# Patient Record
Sex: Male | Born: 1935 | Race: White | Hispanic: No | Marital: Married | Smoking: Former smoker
Health system: Southern US, Community
[De-identification: ages and names within clinical notes are randomized; demographics above are authoritative.]

## PROBLEM LIST (undated history)

## (undated) DIAGNOSIS — I639 Cerebral infarction, unspecified: Secondary | ICD-10-CM

## (undated) DIAGNOSIS — J309 Allergic rhinitis, unspecified: Secondary | ICD-10-CM

## (undated) DIAGNOSIS — N189 Chronic kidney disease, unspecified: Secondary | ICD-10-CM

## (undated) DIAGNOSIS — I493 Ventricular premature depolarization: Secondary | ICD-10-CM

## (undated) DIAGNOSIS — Z950 Presence of cardiac pacemaker: Secondary | ICD-10-CM

## (undated) DIAGNOSIS — N289 Disorder of kidney and ureter, unspecified: Secondary | ICD-10-CM

## (undated) DIAGNOSIS — M1990 Unspecified osteoarthritis, unspecified site: Secondary | ICD-10-CM

## (undated) DIAGNOSIS — I6529 Occlusion and stenosis of unspecified carotid artery: Secondary | ICD-10-CM

## (undated) DIAGNOSIS — D649 Anemia, unspecified: Secondary | ICD-10-CM

## (undated) DIAGNOSIS — E785 Hyperlipidemia, unspecified: Secondary | ICD-10-CM

## (undated) DIAGNOSIS — N183 Chronic kidney disease, stage 3 unspecified: Secondary | ICD-10-CM

## (undated) DIAGNOSIS — Z8489 Family history of other specified conditions: Secondary | ICD-10-CM

## (undated) DIAGNOSIS — I5189 Other ill-defined heart diseases: Secondary | ICD-10-CM

## (undated) DIAGNOSIS — E039 Hypothyroidism, unspecified: Secondary | ICD-10-CM

## (undated) DIAGNOSIS — I10 Essential (primary) hypertension: Secondary | ICD-10-CM

## (undated) DIAGNOSIS — M7730 Calcaneal spur, unspecified foot: Secondary | ICD-10-CM

## (undated) DIAGNOSIS — K635 Polyp of colon: Secondary | ICD-10-CM

## (undated) DIAGNOSIS — I779 Disorder of arteries and arterioles, unspecified: Secondary | ICD-10-CM

## (undated) DIAGNOSIS — I442 Atrioventricular block, complete: Secondary | ICD-10-CM

## (undated) DIAGNOSIS — R42 Dizziness and giddiness: Secondary | ICD-10-CM

## (undated) DIAGNOSIS — B356 Tinea cruris: Secondary | ICD-10-CM

## (undated) DIAGNOSIS — I499 Cardiac arrhythmia, unspecified: Secondary | ICD-10-CM

## (undated) HISTORY — DX: Calcaneal spur, unspecified foot: M77.30

## (undated) HISTORY — DX: Unspecified osteoarthritis, unspecified site: M19.90

## (undated) HISTORY — DX: Hyperlipidemia, unspecified: E78.5

## (undated) HISTORY — DX: Ventricular premature depolarization: I49.3

## (undated) HISTORY — DX: Chronic kidney disease, stage 3 unspecified: N18.30

## (undated) HISTORY — PX: GANGLION CYST EXCISION: SHX1691

## (undated) HISTORY — DX: Polyp of colon: K63.5

## (undated) HISTORY — DX: Dizziness and giddiness: R42

## (undated) HISTORY — DX: Disorder of kidney and ureter, unspecified: N28.9

## (undated) HISTORY — PX: CARPAL TUNNEL RELEASE: SHX101

## (undated) HISTORY — PX: BACK SURGERY: SHX140

## (undated) HISTORY — PX: CIRCUMCISION: SUR203

## (undated) HISTORY — DX: Cerebral infarction, unspecified: I63.9

## (undated) HISTORY — DX: Hypothyroidism, unspecified: E03.9

## (undated) HISTORY — DX: Cardiac arrhythmia, unspecified: I49.9

## (undated) HISTORY — DX: Other ill-defined heart diseases: I51.89

## (undated) HISTORY — DX: Essential (primary) hypertension: I10

## (undated) HISTORY — DX: Disorder of arteries and arterioles, unspecified: I77.9

## (undated) HISTORY — DX: Occlusion and stenosis of unspecified carotid artery: I65.29

## (undated) HISTORY — DX: Tinea cruris: B35.6

## (undated) HISTORY — DX: Anemia, unspecified: D64.9

## (undated) HISTORY — DX: Chronic kidney disease, unspecified: N18.9

## (undated) HISTORY — DX: Allergic rhinitis, unspecified: J30.9

## (undated) HISTORY — PX: MOHS SURGERY: SHX181

## (undated) HISTORY — PX: APPENDECTOMY: SHX54

---

## 2005-12-23 ENCOUNTER — Other Ambulatory Visit: Payer: Self-pay

## 2005-12-23 ENCOUNTER — Emergency Department: Payer: Self-pay | Admitting: General Practice

## 2006-01-03 ENCOUNTER — Ambulatory Visit: Payer: Self-pay | Admitting: Internal Medicine

## 2006-06-13 ENCOUNTER — Ambulatory Visit: Payer: Self-pay | Admitting: Internal Medicine

## 2006-07-04 ENCOUNTER — Ambulatory Visit: Payer: Self-pay | Admitting: Internal Medicine

## 2006-07-27 ENCOUNTER — Ambulatory Visit: Payer: Self-pay | Admitting: Gastroenterology

## 2007-04-12 ENCOUNTER — Ambulatory Visit: Payer: Self-pay | Admitting: Ophthalmology

## 2007-05-01 ENCOUNTER — Ambulatory Visit: Payer: Self-pay | Admitting: Urology

## 2007-05-01 ENCOUNTER — Other Ambulatory Visit: Payer: Self-pay

## 2007-05-04 ENCOUNTER — Ambulatory Visit: Payer: Self-pay | Admitting: Urology

## 2007-05-10 ENCOUNTER — Ambulatory Visit: Payer: Self-pay | Admitting: Internal Medicine

## 2010-07-03 ENCOUNTER — Ambulatory Visit: Payer: Self-pay | Admitting: Anesthesiology

## 2010-07-07 ENCOUNTER — Ambulatory Visit: Payer: Self-pay | Admitting: Unknown Physician Specialty

## 2010-07-08 LAB — PATHOLOGY REPORT

## 2012-03-02 ENCOUNTER — Ambulatory Visit: Payer: Self-pay | Admitting: Internal Medicine

## 2013-07-16 ENCOUNTER — Ambulatory Visit: Payer: Self-pay | Admitting: Gastroenterology

## 2013-11-23 ENCOUNTER — Encounter (INDEPENDENT_AMBULATORY_CARE_PROVIDER_SITE_OTHER): Payer: Self-pay

## 2013-11-23 ENCOUNTER — Encounter: Payer: Self-pay | Admitting: Cardiovascular Disease

## 2013-11-23 ENCOUNTER — Ambulatory Visit (INDEPENDENT_AMBULATORY_CARE_PROVIDER_SITE_OTHER): Payer: Medicare Other | Admitting: Cardiovascular Disease

## 2013-11-23 VITALS — BP 160/90 | HR 75 | Ht 70.0 in | Wt 216.0 lb

## 2013-11-23 DIAGNOSIS — I10 Essential (primary) hypertension: Secondary | ICD-10-CM

## 2013-11-23 DIAGNOSIS — I499 Cardiac arrhythmia, unspecified: Secondary | ICD-10-CM

## 2013-11-23 DIAGNOSIS — I4949 Other premature depolarization: Secondary | ICD-10-CM

## 2013-11-23 DIAGNOSIS — R0602 Shortness of breath: Secondary | ICD-10-CM

## 2013-11-23 DIAGNOSIS — I493 Ventricular premature depolarization: Secondary | ICD-10-CM

## 2013-11-23 NOTE — Assessment & Plan Note (Signed)
Blood pressure is elevated. I will consider treatment with a beta blocker or calcium channel blocker given the presence of PVCs.

## 2013-11-23 NOTE — Progress Notes (Signed)
Primary care physician: Dr. Judithann Sheen  HPI  This is a pleasant 80 year old man who is here today for evaluation of PVCs. He is the husband of Swan Handzel who is one of my patient. He has known history of hypertension but no diabetes or hyperlipidemia. He is a previous smoker. There is no family history of coronary artery disease or arrhythmia.  He has noticed mild dizziness recently especially if he stands up quickly. There has been no syncope or presyncope. He denies any chest pain but does complain of mild exertional dyspnea. He reports improvement in dizziness with meclizine. He was noted to have premature beats recently. Carotid ultrasound showed mild disease. He consumes 2 cups of coffee a day.    Allergies  Allergen Reactions  . Morphine And Related      No current outpatient prescriptions on file prior to visit.   No current facility-administered medications on file prior to visit.     Past Medical History  Diagnosis Date  . Hypothyroidism   . Renal insufficiency   . Anemia, unspecified   . Dizziness and giddiness   . Cardiac dysrhythmia, unspecified   . Carotid artery occlusion     mild  . OA (osteoarthritis)   . Hyperlipidemia   . Allergic rhinitis   . Heel spur   . Colonic polyp   . Tinea cruris   . Chronic kidney disease   . Hypertension      Past Surgical History  Procedure Laterality Date  . Back surgery    . Appendectomy    . Ganglion cyst excision    . Circumcision       Family History  Problem Relation Age of Onset  . Family history unknown: Yes     History   Social History  . Marital Status: Married    Spouse Name: N/A    Number of Children: N/A  . Years of Education: N/A   Occupational History  . Not on file.   Social History Main Topics  . Smoking status: Former Smoker -- 4 years    Types: Cigarettes  . Smokeless tobacco: Not on file  . Alcohol Use: Yes     Comment: occasional  . Drug Use: No  . Sexual Activity: Not on file     Other Topics Concern  . Not on file   Social History Narrative  . No narrative on file     ROS A 10 point review of system was performed. It is negative other than that mentioned in the history of present illness.   PHYSICAL EXAM   BP 160/90  Pulse 75  Ht 5\' 10"  (1.778 m)  Wt 216 lb (97.977 kg)  BMI 30.99 kg/m2 Constitutional: He is oriented to person, place, and time. He appears well-developed and well-nourished. No distress.  HENT: No nasal discharge.  Head: Normocephalic and atraumatic.  Eyes: Pupils are equal and round.  No discharge. Neck: Normal range of motion. Neck supple. No JVD present. No thyromegaly present.  Cardiovascular: Normal rate, regular rhythm with premature beats, normal heart sounds. Exam reveals no gallop and no friction rub. No murmur heard.  Pulmonary/Chest: Effort normal and breath sounds normal. No stridor. No respiratory distress. He has no wheezes. He has no rales. He exhibits no tenderness.  Abdominal: Soft. Bowel sounds are normal. He exhibits no distension. There is no tenderness. There is no rebound and no guarding.  Musculoskeletal: Normal range of motion. He exhibits no edema and no tenderness.  Neurological: He is  alert and oriented to person, place, and time. Coordination normal.  Skin: Skin is warm and dry. No rash noted. He is not diaphoretic. No erythema. No pallor.  Psychiatric: He has a normal mood and affect. His behavior is normal. Judgment and thought content normal.       ZOX:WRUEAEKG:Sinus  Rhythm  - occasional ectopic ventricular beat    -Right bundle branch block.   ABNORMAL     ASSESSMENT AND PLAN

## 2013-11-23 NOTE — Patient Instructions (Signed)
Your physician has recommended that you wear a holter monitor. Holter monitors are medical devices that record the heart's electrical activity. Doctors most often use these monitors to diagnose arrhythmias. Arrhythmias are problems with the speed or rhythm of the heartbeat. The monitor is a small, portable device. You can wear one while you do your normal daily activities. This is usually used to diagnose what is causing palpitations/syncope (passing out).   Your physician has requested that you have an echocardiogram. Echocardiography is a painless test that uses sound waves to create images of your heart. It provides your doctor with information about the size and shape of your heart and how well your heart's chambers and valves are working. This procedure takes approximately one hour. There are no restrictions for this procedure.   Your physician recommends that you schedule a follow-up appointment in:  After your holter and echo

## 2013-11-23 NOTE — Assessment & Plan Note (Signed)
The patient is noted to have PVCs on his EKG. Symptoms include dizziness without syncope. I requested a 48-hour Holter monitor to quantify the burden of PVCs. I recommend checking routine labs if not already done including thyroid function. I also requested an echocardiogram for evaluation.

## 2013-12-04 DIAGNOSIS — I4949 Other premature depolarization: Secondary | ICD-10-CM

## 2013-12-06 ENCOUNTER — Other Ambulatory Visit (INDEPENDENT_AMBULATORY_CARE_PROVIDER_SITE_OTHER): Payer: Medicare Other

## 2013-12-06 ENCOUNTER — Other Ambulatory Visit: Payer: Self-pay

## 2013-12-06 DIAGNOSIS — R0602 Shortness of breath: Secondary | ICD-10-CM

## 2013-12-11 ENCOUNTER — Other Ambulatory Visit: Payer: Self-pay

## 2013-12-11 ENCOUNTER — Ambulatory Visit (INDEPENDENT_AMBULATORY_CARE_PROVIDER_SITE_OTHER): Payer: Medicare Other | Admitting: *Deleted

## 2013-12-11 ENCOUNTER — Telehealth: Payer: Self-pay | Admitting: *Deleted

## 2013-12-11 DIAGNOSIS — I493 Ventricular premature depolarization: Secondary | ICD-10-CM

## 2013-12-11 DIAGNOSIS — I4949 Other premature depolarization: Secondary | ICD-10-CM

## 2013-12-11 MED ORDER — METOPROLOL TARTRATE 25 MG PO TABS: 25.0000 mg | ORAL_TABLET | Freq: Two times a day (BID) | ORAL | Status: DC

## 2013-12-11 NOTE — Telephone Encounter (Signed)
Informed patient that per Dr. Kirke CorinArida his holter showed:  Normal sinus rhythm with frequent PVC's and PAC's  Start Metoprolol 25 mg BID  Provided education on PVC/PAC and metoprolol  Patient verbalized understanding

## 2013-12-14 ENCOUNTER — Encounter: Payer: Self-pay | Admitting: Cardiovascular Disease

## 2013-12-14 ENCOUNTER — Ambulatory Visit (INDEPENDENT_AMBULATORY_CARE_PROVIDER_SITE_OTHER): Payer: Medicare Other | Admitting: Cardiovascular Disease

## 2013-12-14 VITALS — BP 138/68 | HR 66 | Ht 70.0 in | Wt 212.0 lb

## 2013-12-14 DIAGNOSIS — I493 Ventricular premature depolarization: Secondary | ICD-10-CM

## 2013-12-14 DIAGNOSIS — I10 Essential (primary) hypertension: Secondary | ICD-10-CM

## 2013-12-14 DIAGNOSIS — I4949 Other premature depolarization: Secondary | ICD-10-CM

## 2013-12-14 MED ORDER — DILTIAZEM HCL ER COATED BEADS 120 MG PO CP24: 120.0000 mg | ORAL_CAPSULE | Freq: Every day | ORAL | Status: DC

## 2013-12-14 NOTE — Patient Instructions (Signed)
Your physician has recommended you make the following change in your medication: Stop Metoprolol  Start Diltiazem 120 mg daily   Your physician recommends that you schedule a follow-up appointment in:  2 months with Dr. Kirke CorinArida

## 2013-12-14 NOTE — Progress Notes (Signed)
Primary care physician: Dr. Judithann SheenSparks  HPI  This is a pleasant 80 year old man who is here today for a followup visit regarding PVCs. He is the husband of Travis [name] who is one of my patient. He has known history of hypertension but no diabetes or hyperlipidemia. He is a previous smoker.  He was seen recently for  mild dizziness especially if he stands up quickly. There has been no syncope or presyncope. He denies any chest pain but does complain of mild exertional dyspnea.  Carotid ultrasound showed mild disease.  He underwent an echocardiogram which showed normal LV systolic function, mild grade 1 diastolic dysfunction and mild aortic regurgitation. Holter monitor showed PACs (close to 4000) and PVCs (1400). He was started on metoprolol 25 mg twice daily. However, he reports no improvement in symptoms. He actually feels more dizzy since he started taking metoprolol.   Allergies  Allergen Reactions  . Morphine And Related   . Tetracyclines & Related     Other reaction(s): UNKNOWN     Current Outpatient Prescriptions on File Prior to Visit  Medication Sig Dispense Refill  . aspirin 81 MG tablet Take 81 mg by mouth daily.      . benazepril-hydrochlorthiazide (LOTENSIN HCT) 20-12.5 MG per tablet Take 1 tablet by mouth daily.      . celecoxib (CELEBREX) 200 MG capsule Take 200 mg by mouth daily.      Marland Kitchen. doxazosin (CARDURA) 4 MG tablet Take 4 mg by mouth daily.      . finasteride (PROSCAR) 5 MG tablet Take 5 mg by mouth daily.      . fluticasone (FLONASE) 50 MCG/ACT nasal spray Place 1 spray into both nostrils as needed for allergies or rhinitis.      Marland Kitchen. levothyroxine (SYNTHROID, LEVOTHROID) 50 MCG tablet Take 50 mcg by mouth daily before breakfast.      . metoprolol tartrate (LOPRESSOR) 25 MG tablet Take 1 tablet (25 mg total) by mouth 2 (two) times daily.  180 tablet  3  . potassium chloride SA (K-DUR,KLOR-CON) 20 MEQ tablet Take 20 mEq by mouth 2 (two) times daily.      . ranitidine (ZANTAC)  75 MG tablet Take 75 mg by mouth as needed for heartburn.       No current facility-administered medications on file prior to visit.     Past Medical History  Diagnosis Date  . Hypothyroidism   . Renal insufficiency   . Anemia, unspecified   . Dizziness and giddiness   . Cardiac dysrhythmia, unspecified   . Carotid artery occlusion     mild  . OA (osteoarthritis)   . Hyperlipidemia   . Allergic rhinitis   . Heel spur   . Colonic polyp   . Tinea cruris   . Chronic kidney disease   . Hypertension      Past Surgical History  Procedure Laterality Date  . Back surgery    . Appendectomy    . Ganglion cyst excision    . Circumcision       History reviewed. No pertinent family history.   History   Social History  . Marital Status: Married    Spouse Name: N/A    Number of Children: N/A  . Years of Education: N/A   Occupational History  . Not on file.   Social History Main Topics  . Smoking status: Former Smoker -- 4 years    Types: Cigarettes  . Smokeless tobacco: Not on file  . Alcohol Use: Yes  Comment: occasional  . Drug Use: No  . Sexual Activity: Not on file   Other Topics Concern  . Not on file   Social History Narrative  . No narrative on file     ROS A 10 point review of system was performed. It is negative other than that mentioned in the history of present illness.   PHYSICAL EXAM   BP 138/68  Pulse 66  Ht 5\' 10"  (1.778 m)  Wt 212 lb (96.163 kg)  BMI 30.42 kg/m2 Constitutional: He is oriented to person, place, and time. He appears well-developed and well-nourished. No distress.  HENT: No nasal discharge.  Head: Normocephalic and atraumatic.  Eyes: Pupils are equal and round.  No discharge. Neck: Normal range of motion. Neck supple. No JVD present. No thyromegaly present.  Cardiovascular: Normal rate, regular rhythm with premature beats, normal heart sounds. Exam reveals no gallop and no friction rub. No murmur heard.    Pulmonary/Chest: Effort normal and breath sounds normal. No stridor. No respiratory distress. He has no wheezes. He has no rales. He exhibits no tenderness.  Abdominal: Soft. Bowel sounds are normal. He exhibits no distension. There is no tenderness. There is no rebound and no guarding.  Musculoskeletal: Normal range of motion. He exhibits no edema and no tenderness.  Neurological: He is alert and oriented to person, place, and time. Coordination normal.  Skin: Skin is warm and dry. No rash noted. He is not diaphoretic. No erythema. No pallor.  Psychiatric: He has a normal mood and affect. His behavior is normal. Judgment and thought content normal.       ZOX:WRUEAEKG:Sinus  Rhythm  - occasional ectopic ventricular beat    -Right bundle branch block.    ABNORMAL     ASSESSMENT AND PLAN

## 2013-12-15 NOTE — Assessment & Plan Note (Signed)
He had no significant improvement with metoprolol. Thus, I switched him to diltiazem extended release 120 mg once daily. Most of the patient's symptoms seem to be related to orthostatic dizziness which might be a separate issue from the PVCs.  Some of his antihypertensive medications might be contributing to his orthostatic dizziness including Cardura and hydrochlorothiazide. I asked him to discuss with Dr. Judithann SheenSparks about the possibility of stopping Cardura or decreasing the dose.

## 2013-12-15 NOTE — Assessment & Plan Note (Signed)
Blood pressure is reasonably controlled on current medications. 

## 2014-02-15 ENCOUNTER — Encounter: Payer: Self-pay | Admitting: Cardiovascular Disease

## 2014-02-15 ENCOUNTER — Ambulatory Visit (INDEPENDENT_AMBULATORY_CARE_PROVIDER_SITE_OTHER): Payer: Medicare Other | Admitting: Cardiovascular Disease

## 2014-02-15 VITALS — BP 130/74 | HR 58 | Ht 70.0 in | Wt 215.5 lb

## 2014-02-15 DIAGNOSIS — I493 Ventricular premature depolarization: Secondary | ICD-10-CM

## 2014-02-15 DIAGNOSIS — I4949 Other premature depolarization: Secondary | ICD-10-CM

## 2014-02-15 DIAGNOSIS — I10 Essential (primary) hypertension: Secondary | ICD-10-CM

## 2014-02-15 NOTE — Assessment & Plan Note (Signed)
Symptoms improved significantly after switching from metoprolol to diltiazem. He continues to have moderate orthostatic dizziness which  could be related to treatment with Cardura and hydrochlorothiazide. Symptoms have improved significantly and thus I made no changes today.

## 2014-02-15 NOTE — Patient Instructions (Signed)
Continue same medications.   Your physician wants you to follow-up in: 12 month.  You will receive a reminder letter in the mail two months in advance. If you don't receive a letter, please call our office to schedule the follow-up appointment.  

## 2014-02-15 NOTE — Progress Notes (Signed)
Primary care physician: Dr. Judithann SheenSparks  HPI  This is a pleasant 80 year old man who is here today for a followup visit regarding PVCs. He is the husband of Elvera LennoxLaura Norfolk who is one of my patient. He has known history of hypertension but no diabetes or hyperlipidemia. He is a previous smoker.  He was seen recently for  mild dizziness especially if he stands up quickly. There has been no syncope or presyncope. He denies any chest pain but does complain of mild exertional dyspnea.  Carotid ultrasound showed mild disease.  He underwent an echocardiogram which showed normal LV systolic function, mild grade 1 diastolic dysfunction and mild aortic regurgitation. Holter monitor showed PACs (close to 4000) and PVCs (1400). He was started on metoprolol 25 mg twice daily. However, he had worsening symptoms with metoprolol and thus I switched him to diltiazem. He has been doing better since then.  Allergies  Allergen Reactions  . Morphine And Related   . Phenol     Other reaction(s): Unknown  . Tetracyclines & Related     Other reaction(s): UNKNOWN     Current Outpatient Prescriptions on File Prior to Visit  Medication Sig Dispense Refill  . aspirin 81 MG tablet Take 81 mg by mouth daily.      . benazepril-hydrochlorthiazide (LOTENSIN HCT) 20-12.5 MG per tablet Take 1 tablet by mouth daily.      . celecoxib (CELEBREX) 200 MG capsule Take 200 mg by mouth daily.      Marland Kitchen. diltiazem (CARDIZEM CD) 120 MG 24 hr capsule Take 1 capsule (120 mg total) by mouth daily.  90 capsule  3  . doxazosin (CARDURA) 4 MG tablet Take 4 mg by mouth daily.      . finasteride (PROSCAR) 5 MG tablet Take 5 mg by mouth daily.      . fluticasone (FLONASE) 50 MCG/ACT nasal spray Place 1 spray into both nostrils as needed for allergies or rhinitis.      Marland Kitchen. levothyroxine (SYNTHROID, LEVOTHROID) 50 MCG tablet Take 50 mcg by mouth daily before breakfast.      . potassium chloride SA (K-DUR,KLOR-CON) 20 MEQ tablet Take 20 mEq by mouth 2  (two) times daily.      . ranitidine (ZANTAC) 75 MG tablet Take 75 mg by mouth as needed for heartburn.       No current facility-administered medications on file prior to visit.     Past Medical History  Diagnosis Date  . Hypothyroidism   . Renal insufficiency   . Anemia, unspecified   . Dizziness and giddiness   . Cardiac dysrhythmia, unspecified   . Carotid artery occlusion     mild  . OA (osteoarthritis)   . Hyperlipidemia   . Allergic rhinitis   . Heel spur   . Colonic polyp   . Tinea cruris   . Chronic kidney disease   . Hypertension      Past Surgical History  Procedure Laterality Date  . Back surgery    . Appendectomy    . Ganglion cyst excision    . Circumcision       Family History  Problem Relation Age of Onset  . Family history unknown: Yes     History   Social History  . Marital Status: Married    Spouse Name: N/A    Number of Children: N/A  . Years of Education: N/A   Occupational History  . Not on file.   Social History Main Topics  . Smoking status:  Former Smoker -- 4 years    Types: Cigarettes  . Smokeless tobacco: Not on file  . Alcohol Use: Yes     Comment: occasional  . Drug Use: No  . Sexual Activity: Not on file   Other Topics Concern  . Not on file   Social History Narrative  . No narrative on file     ROS A 10 point review of system was performed. It is negative other than that mentioned in the history of present illness.   PHYSICAL EXAM   BP 130/74  Pulse 58  Ht 5\' 10"  (1.778 m)  Wt 215 lb 8 oz (97.75 kg)  BMI 30.92 kg/m2 Constitutional: He is oriented to person, place, and time. He appears well-developed and well-nourished. No distress.  HENT: No nasal discharge.  Head: Normocephalic and atraumatic.  Eyes: Pupils are equal and round.  No discharge. Neck: Normal range of motion. Neck supple. No JVD present. No thyromegaly present.  Cardiovascular: Normal rate, regular rhythm with premature beats, normal  heart sounds. Exam reveals no gallop and no friction rub. No murmur heard.  Pulmonary/Chest: Effort normal and breath sounds normal. No stridor. No respiratory distress. He has no wheezes. He has no rales. He exhibits no tenderness.  Abdominal: Soft. Bowel sounds are normal. He exhibits no distension. There is no tenderness. There is no rebound and no guarding.  Musculoskeletal: Normal range of motion. He exhibits no edema and no tenderness.  Neurological: He is alert and oriented to person, place, and time. Coordination normal.  Skin: Skin is warm and dry. No rash noted. He is not diaphoretic. No erythema. No pallor.  Psychiatric: He has a normal mood and affect. His behavior is normal. Judgment and thought content normal.       ZOX:WRUEA  Bradycardia  -Right bundle branch block.   ABNORMAL     ASSESSMENT AND PLAN

## 2014-02-15 NOTE — Assessment & Plan Note (Signed)
Blood pressure is well controlled on current medications. 

## 2014-11-04 ENCOUNTER — Other Ambulatory Visit: Payer: Self-pay

## 2014-11-04 MED ORDER — DILTIAZEM HCL ER COATED BEADS 120 MG PO CP24: 120.0000 mg | ORAL_CAPSULE | Freq: Every day | ORAL | Status: DC

## 2014-11-04 NOTE — Telephone Encounter (Signed)
Refill sent for diltiazem

## 2015-02-17 ENCOUNTER — Encounter: Payer: Self-pay | Admitting: *Deleted

## 2015-02-17 ENCOUNTER — Ambulatory Visit: Payer: No Typology Code available for payment source | Admitting: Cardiovascular Disease

## 2015-02-24 ENCOUNTER — Encounter: Payer: Self-pay | Admitting: Cardiovascular Disease

## 2015-02-24 ENCOUNTER — Ambulatory Visit (INDEPENDENT_AMBULATORY_CARE_PROVIDER_SITE_OTHER): Payer: Medicare Other | Admitting: Cardiovascular Disease

## 2015-02-24 VITALS — BP 160/80 | HR 65 | Ht 70.0 in | Wt 213.8 lb

## 2015-02-24 DIAGNOSIS — I493 Ventricular premature depolarization: Secondary | ICD-10-CM | POA: Diagnosis not present

## 2015-02-24 DIAGNOSIS — I10 Essential (primary) hypertension: Secondary | ICD-10-CM | POA: Diagnosis not present

## 2015-02-24 NOTE — Assessment & Plan Note (Signed)
These seem to be controlled with current dose of diltiazem.. I made no changes today.

## 2015-02-24 NOTE — Assessment & Plan Note (Signed)
Blood pressure is elevated today but that has not been the trend recently. Continue to monitor.

## 2015-02-24 NOTE — Progress Notes (Signed)
Primary care physician: Dr. Judithann Sheen  HPI  This is a pleasant 81 year old man who is here today for a followup visit regarding PVCs. He is the husband of Kaylum Shrum who is one of my patient. He has known history of hypertension and chronic kidney disease but no diabetes or hyperlipidemia. He is a previous smoker.   Echocardiogram in June 2015 showed normal LV systolic function, mild grade 1 diastolic dysfunction and mild aortic regurgitation. Holter monitor showed PACs (close to 4000) and PVCs (1400). He was treated with metoprolol 25 mg twice daily. However, he had worsening symptoms with metoprolol and thus I switched him to diltiazem. He has been doing better since then. No new reported symptoms. He has last done recently with Dr. Judithann Sheen which showed stable kidney function with a creatinine of 1.7. Blood pressure is usually not as high as it is today.  Allergies  Allergen Reactions  . Morphine And Related   . Phenol     Other reaction(s): Unknown  . Tetracyclines & Related     Other reaction(s): UNKNOWN     Current Outpatient Prescriptions on File Prior to Visit  Medication Sig Dispense Refill  . aspirin 81 MG tablet Take 81 mg by mouth daily.    . benazepril-hydrochlorthiazide (LOTENSIN HCT) 20-12.5 MG per tablet Take 1 tablet by mouth daily.    . celecoxib (CELEBREX) 200 MG capsule Take 200 mg by mouth daily.    Marland Kitchen diltiazem (CARDIZEM CD) 120 MG 24 hr capsule Take 1 capsule (120 mg total) by mouth daily. 90 capsule 3  . doxazosin (CARDURA) 4 MG tablet Take 4 mg by mouth daily.    . finasteride (PROSCAR) 5 MG tablet Take 5 mg by mouth daily.    . fluticasone (FLONASE) 50 MCG/ACT nasal spray Place 1 spray into both nostrils as needed for allergies or rhinitis.    Marland Kitchen levothyroxine (SYNTHROID, LEVOTHROID) 50 MCG tablet Take 50 mcg by mouth daily before breakfast.    . potassium chloride SA (K-DUR,KLOR-CON) 20 MEQ tablet Take 20 mEq by mouth 2 (two) times daily.    . ranitidine (ZANTAC)  75 MG tablet Take 75 mg by mouth as needed for heartburn.     No current facility-administered medications on file prior to visit.     Past Medical History  Diagnosis Date  . Hypothyroidism   . Renal insufficiency   . Anemia, unspecified   . Dizziness and giddiness   . Cardiac dysrhythmia, unspecified   . Carotid artery occlusion     mild  . OA (osteoarthritis)   . Hyperlipidemia   . Allergic rhinitis   . Heel spur   . Colonic polyp   . Tinea cruris   . Chronic kidney disease   . Hypertension      Past Surgical History  Procedure Laterality Date  . Back surgery    . Appendectomy    . Ganglion cyst excision    . Circumcision       Family History  Problem Relation Age of Onset  . Family history unknown: Yes     Social History   Social History  . Marital Status: Married    Spouse Name: N/A  . Number of Children: N/A  . Years of Education: N/A   Occupational History  . Not on file.   Social History Main Topics  . Smoking status: Former Smoker -- 4 years    Types: Cigarettes  . Smokeless tobacco: Not on file  . Alcohol Use: Yes  Comment: occasional  . Drug Use: No  . Sexual Activity: Not on file   Other Topics Concern  . Not on file   Social History Narrative     ROS A 10 point review of system was performed. It is negative other than that mentioned in the history of present illness.   PHYSICAL EXAM   BP 160/80 mmHg  Pulse 65  Ht 5\' 10"  (1.778 m)  Wt 213 lb 12 oz (96.956 kg)  BMI 30.67 kg/m2 Constitutional: He is oriented to person, place, and time. He appears well-developed and well-nourished. No distress.  HENT: No nasal discharge.  Head: Normocephalic and atraumatic.  Eyes: Pupils are equal and round.  No discharge. Neck: Normal range of motion. Neck supple. No JVD present. No thyromegaly present.  Cardiovascular: Normal rate, regular rhythm with premature beats, normal heart sounds. Exam reveals no gallop and no friction rub. No  murmur heard.  Pulmonary/Chest: Effort normal and breath sounds normal. No stridor. No respiratory distress. He has no wheezes. He has no rales. He exhibits no tenderness.  Abdominal: Soft. Bowel sounds are normal. He exhibits no distension. There is no tenderness. There is no rebound and no guarding.  Musculoskeletal: Normal range of motion. He exhibits no edema and no tenderness.  Neurological: He is alert and oriented to person, place, and time. Coordination normal.  Skin: Skin is warm and dry. No rash noted. He is not diaphoretic. No erythema. No pallor.  Psychiatric: He has a normal mood and affect. His behavior is normal. Judgment and thought content normal.       ZOX:WRUEA  Rhythm  -Right bundle branch block.   ABNORMAL     ASSESSMENT AND PLAN

## 2015-02-24 NOTE — Patient Instructions (Signed)
Medication Instructions: Continue same medications.   Labwork: None.   Procedures/Testing: None.   Follow-Up: 1 year with Dr. Sabrena Gavitt  Any Additional Special Instructions Will Be Listed Below (If Applicable).   

## 2015-07-16 ENCOUNTER — Telehealth: Payer: Self-pay | Admitting: *Deleted

## 2015-07-16 ENCOUNTER — Ambulatory Visit: Payer: No Typology Code available for payment source | Admitting: Cardiovascular Disease

## 2015-07-16 NOTE — Telephone Encounter (Signed)
Dr sparks called earlier to see if we can fit pt in to see [place] today but pt called back stating he could not come in But per Dr Judithann Sheen he was having Atypical CP  Pt is awaiting our call or suggestion as to what he needs to do.   Calling Dr Judithann Sheen office to get EKG sent over.Travis [name]

## 2015-07-16 NOTE — Telephone Encounter (Signed)
S/w pt who states he is willing to make appt w/Dr. Kirke Corin. He was unable to come in today. Reviewed sx that would need immediate attention. Pt verbalized understanding.  Forwarded to scheduling.  Appt Jan 24 w/Ryan Dunn, PA-C

## 2015-07-22 ENCOUNTER — Encounter: Payer: Self-pay | Admitting: Physician Assistant

## 2015-07-22 ENCOUNTER — Ambulatory Visit (INDEPENDENT_AMBULATORY_CARE_PROVIDER_SITE_OTHER): Payer: Medicare Other | Admitting: Physician Assistant

## 2015-07-22 VITALS — BP 134/78 | HR 72 | Ht 70.0 in | Wt 215.5 lb

## 2015-07-22 DIAGNOSIS — I491 Atrial premature depolarization: Secondary | ICD-10-CM | POA: Diagnosis not present

## 2015-07-22 DIAGNOSIS — R208 Other disturbances of skin sensation: Secondary | ICD-10-CM | POA: Diagnosis not present

## 2015-07-22 DIAGNOSIS — R079 Chest pain, unspecified: Secondary | ICD-10-CM

## 2015-07-22 DIAGNOSIS — I493 Ventricular premature depolarization: Secondary | ICD-10-CM | POA: Diagnosis not present

## 2015-07-22 DIAGNOSIS — R200 Anesthesia of skin: Secondary | ICD-10-CM

## 2015-07-22 DIAGNOSIS — I10 Essential (primary) hypertension: Secondary | ICD-10-CM

## 2015-07-22 NOTE — Progress Notes (Signed)
Cardiology Office Note Date:  07/22/2015  Patient ID:  Travis Ressel., DOB [birth year], MRN [id number] PCP:  Marguarite Arbour, MD  Cardiologist:  Dr. Kirke Corin, MD    Chief Complaint: Chest pain  History of Present Illness: Travis Anctil. is a 82 y.o. male with history of PVCs, PACs, CKD stage III, prior tobacco abuse, and HTN who presents at the request of his PCP for the evaluation of chest pain. Echocardiogram in June 2015 showed normal LV systolic function, mild grade 1 diastolic dysfunction and mild aortic regurgitation. Holter monitor showed PACs (close to 4000) and PVCs (1400). He was treated with metoprolol 25 mg twice daily. However, he had worsening symptoms with metoprolol and thus he was switched him to diltiazem. He has been doing better since then. He was seen by his PCP on 1/18 for routine follow up and was doing well at that time. He happened to mention at that time he had an episode of chest pain on 1/17 that lasted most of the day. He thinks the pain began when he woke up, but he is not sure. He is also not sure if the pain was reproducible to palpation. There were no associated symptoms. He has not noticed any changes in his functional capacity. No orthopnea, lower extremity swelling, PND, cough, diaphoresis, or early satiety.   He also mentions a long history of bilateral hand numbness, only when he sleeps. Symptoms did not begin until he bought a new pillow. PCP has tried a muscle relaxer.    Past Medical History  Diagnosis Date  . Hypothyroidism   . Renal insufficiency   . Anemia, unspecified   . Dizziness and giddiness   . Cardiac dysrhythmia, unspecified   . Carotid artery occlusion     mild  . OA (osteoarthritis)   . Hyperlipidemia   . Allergic rhinitis   . Heel spur   . Colonic polyp   . Tinea cruris   . Chronic kidney disease   . Hypertension     Past Surgical History  Procedure Laterality Date  . Back surgery    . Appendectomy    . Ganglion cyst  excision    . Circumcision    . Mohs surgery      Current Outpatient Prescriptions  Medication Sig Dispense Refill  . aspirin 81 MG tablet Take 81 mg by mouth daily.    . benazepril-hydrochlorthiazide (LOTENSIN HCT) 20-12.5 MG per tablet Take 1 tablet by mouth daily.    . celecoxib (CELEBREX) 200 MG capsule Take 200 mg by mouth daily.    Marland Kitchen diltiazem (CARDIZEM CD) 120 MG 24 hr capsule Take 1 capsule (120 mg total) by mouth daily. 90 capsule 3  . doxazosin (CARDURA) 4 MG tablet Take 4 mg by mouth daily.    . finasteride (PROSCAR) 5 MG tablet Take 5 mg by mouth daily.    . fluticasone (FLONASE) 50 MCG/ACT nasal spray Place 1 spray into both nostrils as needed for allergies or rhinitis.    Marland Kitchen levothyroxine (SYNTHROID, LEVOTHROID) 50 MCG tablet Take 50 mcg by mouth daily before breakfast.    . polyethylene glycol (MIRALAX / GLYCOLAX) packet Take 17 g by mouth daily.    . potassium chloride SA (K-DUR,KLOR-CON) 20 MEQ tablet Take 20 mEq by mouth 2 (two) times daily.    . ranitidine (ZANTAC) 75 MG tablet Take 75 mg by mouth as needed for heartburn.     No current facility-administered medications for this visit.  Allergies:   Morphine and related; Phenol; and Tetracyclines & related   Social History:  The patient  reports that he has quit smoking. His smoking use included Cigarettes. He quit after 4 years of use. He does not have any smokeless tobacco history on file. He reports that he drinks alcohol. He reports that he does not use illicit drugs.   Family History:  The patient's Family history is unknown by patient.  ROS:   Review of Systems  Constitutional: Negative for fever, chills, weight loss, malaise/fatigue and diaphoresis.  HENT: Negative for congestion.   Eyes: Negative for discharge and redness.  Respiratory: Negative for cough, hemoptysis, sputum production, shortness of breath and wheezing.   Cardiovascular: Positive for chest pain. Negative for palpitations, orthopnea,  claudication, leg swelling and PND.       Isolated episode on 1/17 as above  Gastrointestinal: Negative for nausea, vomiting and abdominal pain.  Musculoskeletal: Negative for falls.  Skin: Negative for rash.  Neurological: Negative for sensory change, speech change, focal weakness, loss of consciousness and weakness.  Endo/Heme/Allergies: Does not bruise/bleed easily.  Psychiatric/Behavioral: The patient is not nervous/anxious.   All other systems reviewed and are negative.    PHYSICAL EXAM:  VS:  BP 134/78 mmHg  Pulse 72  Ht  (1.778 m)  Wt 215 lb 8 oz (97.75 kg)  BMI 30.92 kg/m2 BMI: Body mass index is 30.92 kg/(m^2). Well nourished, well developed, in no acute distress HEENT: normocephalic, atraumatic Neck: no JVD, carotid bruits or masses Cardiac:  normal S1, S2; RRR; no murmurs, rubs, or gallops Lungs:  clear to auscultation bilaterally, no wheezing, rhonchi or rales Abd: soft, nontender, no hepatomegaly, + BS MS: no deformity or atrophy Ext: no edema Skin: warm and dry, no rash, bilateral radial pulses 2+ Neuro:  moves all extremities spontaneously, no focal abnormalities noted, follows commands Psych: euthymic mood, full affect   EKG:  Was ordered today. Shows NSR, 72 bpm, RBBB  Recent Labs: No results found for requested labs within last 365 days.  No results found for requested labs within last 365 days.   CrCl cannot be calculated (Patient has no serum creatinine result on file.).   Wt Readings from Last 3 Encounters:  07/22/15 215 lb 8 oz (97.75 kg)  02/24/15 213 lb 12 oz (96.956 kg)  02/15/14 215 lb 8 oz (97.75 kg)     Other studies reviewed: Additional studies/records reviewed today include: summarized above  ASSESSMENT AND PLAN:  1. Chest pain: -Schedule Lexiscan Myoview to evaluate for high risk ischemia -No further symptoms since isolated day of chest pain on 1/17. He is uncertain if pain was reproducible  -If nuclear stress testing is normal  consider alternative etiologies such as MSK vs GI, will defer to PCP  2.  Intermittent bilateral hand paresthesias: -Symptoms correspond to buying a new pillow -Get a different pillow -Can use a heating pad -Per PCP  3. PVCs/PACs: -Asymptomatic -Cardizem CD 120 mg daily  3. HTN: -Well controlled -Continue current medications   Disposition: F/u with Dr. Kirke Corin, MD in 1 month  Current medicines are reviewed at length with the patient today.  The patient did not have any concerns regarding medicines.  Elinor Dodge PA-C 07/22/2015 4:29 PM     [facility] [place] [address] [place], [place] [address] [phone]

## 2015-07-22 NOTE — Patient Instructions (Addendum)
Medication Instructions:  Please continue current medications  Labwork: None  Testing/Procedures: [facility] MYOVIEW  Your caregiver has ordered a Stress Test with nuclear imaging. The purpose of this test is to evaluate the blood supply to your heart muscle. This procedure is referred to as a "Non-Invasive Stress Test." This is because other than having an IV started in your vein, nothing is inserted or "invades" your body. Cardiac stress tests are done to find areas of poor blood flow to the heart by determining the extent of coronary artery disease (CAD). Some patients exercise on a treadmill, which naturally increases the blood flow to your heart, while others who are  unable to walk on a treadmill due to physical limitations have a pharmacologic/chemical stress agent called Lexiscan . This medicine will mimic walking on a treadmill by temporarily increasing your coronary blood flow.   Please note: these test may take anywhere between 2-4 hours to complete  PLEASE REPORT TO [facility] MEDICAL MALL ENTRANCE  THE VOLUNTEERS AT THE FIRST DESK WILL DIRECT YOU WHERE TO GO  Date of Procedure:______Tuesday, Feb 7_________________  Arrival Time for Procedure:____7:45 am_____________________  How to prepare for your Myoview test:   Do not eat or drink after midnight  No caffeine for 24 hours prior to test  No smoking 24 hours prior to test.  Your medication may be taken with water.  If your doctor stopped a medication because of this test, do not take that medication.  Ladies, please do not wear dresses.  Skirts or pants are appropriate. Please wear a short sleeve shirt.  No perfume, cologne or lotion.  Follow-Up: Your physician recommends that you schedule a follow-up appointment in: 1 month w/ Dr. Kirke Corin  Date & time: _____________________________  If you need a refill on your cardiac medications before your next appointment, please call your pharmacy.  Cardiac Nuclear Scanning A cardiac  nuclear scan is used to check your heart for problems, such as the following:  A portion of the heart is not getting enough blood.  Part of the heart muscle has died, which happens with a heart attack.  The heart wall is not working normally.  In this test, a radioactive dye (tracer) is injected into your bloodstream. After the tracer has traveled to your heart, a scanning device is used to measure how much of the tracer is absorbed by or distributed to various areas of your heart. LET [facility] CARE PROVIDER KNOW ABOUT:  Any allergies you have.  All medicines you are taking, including vitamins, herbs, eye drops, creams, and over-the-counter medicines.  Previous problems you or members of your family have had with the use of anesthetics.  Any blood disorders you have.  Previous surgeries you have had.  Medical conditions you have.  RISKS AND COMPLICATIONS Generally, this is a safe procedure. However, as with any procedure, problems can occur. Possible problems include:   Serious chest pain.  Rapid heartbeat.  Sensation of warmth in your chest. This usually passes quickly. BEFORE THE PROCEDURE Ask your health care provider about changing or stopping your regular medicines. PROCEDURE This procedure is usually done at a hospital and takes 2-4 hours.  An IV tube is inserted into one of your veins.  Your health care provider will inject a small amount of radioactive tracer through the tube.  You will then wait for 20-40 minutes while the tracer travels through your bloodstream.  You will lie down on an exam table so images of your heart can be  taken. Images will be taken for about 15-20 minutes.  You will exercise on a treadmill or stationary bike. While you exercise, your heart activity will be monitored with an electrocardiogram (ECG), and your blood pressure will be checked.  If you are unable to exercise, you may be given a medicine to make your heart beat  faster.  When blood flow to your heart has peaked, tracer will again be injected through the IV tube.  After 20-40 minutes, you will get back on the exam table and have more images taken of your heart.  When the procedure is over, your IV tube will be removed. AFTER THE PROCEDURE  You will likely be able to leave shortly after the test. Unless your health care provider tells you otherwise, you may return to your normal schedule, including diet, activities, and medicines.  Make sure you find out how and when you will get your test results.   This information is not intended to replace advice given to you by your health care provider. Make sure you discuss any questions you have with your health care provider.   Document Released: 07/09/2004 Document Revised: 06/19/2013 Document Reviewed: 05/23/2013 Elsevier Interactive Patient Education [employer]

## 2015-07-31 ENCOUNTER — Telehealth: Payer: Self-pay

## 2015-07-31 NOTE — Telephone Encounter (Signed)
Pt states he would like to cx his stress test appt. Please call. He does not wish r/s

## 2015-07-31 NOTE — Telephone Encounter (Signed)
S/w pt who wishes to cancel stress test and f/u appt w/Arida. States he has had no chest pain and is "doing my own stress test right now" as he is exercising while on the phone and denies any sx. Advised pt to continue to monitor and call to report any symptoms. Pt verbalized understanding with no further questions.

## 2015-08-05 ENCOUNTER — Other Ambulatory Visit: Payer: No Typology Code available for payment source

## 2015-08-06 ENCOUNTER — Other Ambulatory Visit: Payer: Self-pay | Admitting: Otolaryngology

## 2015-08-06 DIAGNOSIS — E041 Nontoxic single thyroid nodule: Secondary | ICD-10-CM

## 2015-08-12 ENCOUNTER — Ambulatory Visit
Admission: RE | Admit: 2015-08-12 | Discharge: 2015-08-12 | Disposition: A | Discharge status: Home / Self Care | Payer: Medicare Other | Source: Ambulatory Visit | Attending: Otolaryngology | Admitting: Otolaryngology

## 2015-08-12 DIAGNOSIS — E041 Nontoxic single thyroid nodule: Secondary | ICD-10-CM | POA: Insufficient documentation

## 2015-08-22 ENCOUNTER — Ambulatory Visit: Payer: No Typology Code available for payment source | Admitting: Cardiovascular Disease

## 2015-09-02 ENCOUNTER — Other Ambulatory Visit: Payer: Self-pay | Admitting: *Deleted

## 2015-09-02 MED ORDER — DILTIAZEM HCL ER COATED BEADS 120 MG PO CP24: 120.0000 mg | ORAL_CAPSULE | Freq: Every day | ORAL | Status: DC

## 2016-02-24 ENCOUNTER — Encounter: Payer: Self-pay | Admitting: Cardiovascular Disease

## 2016-02-24 ENCOUNTER — Ambulatory Visit (INDEPENDENT_AMBULATORY_CARE_PROVIDER_SITE_OTHER): Payer: Medicare Other | Admitting: Cardiovascular Disease

## 2016-02-24 VITALS — BP 164/80 | HR 64 | Ht 70.0 in | Wt 212.2 lb

## 2016-02-24 DIAGNOSIS — I10 Essential (primary) hypertension: Secondary | ICD-10-CM

## 2016-02-24 DIAGNOSIS — I493 Ventricular premature depolarization: Secondary | ICD-10-CM | POA: Diagnosis not present

## 2016-02-24 NOTE — Patient Instructions (Signed)
Medication Instructions: Continue same medications.   Labwork: None.   Procedures/Testing: None.   Follow-Up: 1 year with Dr. Arida.   Any Additional Special Instructions Will Be Listed Below (If Applicable).     If you need a refill on your cardiac medications before your next appointment, please call your pharmacy.   

## 2016-02-24 NOTE — Progress Notes (Signed)
Cardiology Office Note   Date:  02/24/2016   ID:  Travis EllisGlenn D Paganelli [name], DOB 1929-09-23, MRN [id number]  PCP:  Marguarite ArbourSPARKS,JEFFREY D, MD  Cardiologist:   Lorine BearsMuhammad Genette Huertas, MD   Chief Complaint  Patient presents with  . Other    12 month follow up. Meds reviewed by the patient verbally. "doing well."       History of Present Illness: Travis EllisGlenn D Haring [name] is a 82 y.o. male who presents for a followup visit regarding PVCs. He is the husband of Travis [name] who is one of my patient. He has known history of hypertension and chronic kidney disease but no diabetes or hyperlipidemia. He is a previous smoker.   Echocardiogram in June 2015 showed normal LV systolic function, mild grade 1 diastolic dysfunction and mild aortic regurgitation. Holter monitor showed PACs (close to 4000) and PVCs (1400).   He had worsening symptoms with metoprolol but improved significantly with diltiazem. He was seen in February by Alycia Rossettiyan for atypical chest pain. A stress test was ordered but the patient canceled given no recurrent symptoms. He has not had any chest pain since then and he feels at baseline. Blood pressure tends to be somewhat on the high side but it's usually more controlled at home. He denies palpitations, syncope or presyncope.  Past Medical History:  Diagnosis Date  . Allergic rhinitis   . Anemia, unspecified   . Cardiac dysrhythmia, unspecified   . Carotid artery occlusion    mild  . Chronic kidney disease   . Colonic polyp   . Dizziness and giddiness   . Heel spur   . Hyperlipidemia   . Hypertension   . Hypothyroidism   . OA (osteoarthritis)   . Renal insufficiency   . Tinea cruris     Past Surgical History:  Procedure Laterality Date  . APPENDECTOMY    . BACK SURGERY    . CIRCUMCISION    . GANGLION CYST EXCISION    . MOHS SURGERY       Current Outpatient Prescriptions  Medication Sig Dispense Refill  . aspirin 81 MG tablet Take 81 mg by mouth daily.    .  benazepril-hydrochlorthiazide (LOTENSIN HCT) 20-12.5 MG per tablet Take 1 tablet by mouth daily.    . celecoxib (CELEBREX) 200 MG capsule Take 200 mg by mouth daily.    Marland Kitchen. diltiazem (CARDIZEM CD) 120 MG 24 hr capsule Take 1 capsule (120 mg total) by mouth daily. 90 capsule 3  . doxazosin (CARDURA) 4 MG tablet Take 4 mg by mouth daily.    . finasteride (PROSCAR) 5 MG tablet Take 5 mg by mouth daily.    . fluticasone (FLONASE) 50 MCG/ACT nasal spray Place 1 spray into both nostrils as needed for allergies or rhinitis.    Marland Kitchen. levothyroxine (SYNTHROID, LEVOTHROID) 50 MCG tablet Take 50 mcg by mouth daily before breakfast.    . polyethylene glycol (MIRALAX / GLYCOLAX) packet Take 17 g by mouth daily.    . potassium chloride SA (K-DUR,KLOR-CON) 20 MEQ tablet Take 20 mEq by mouth 2 (two) times daily.    . ranitidine (ZANTAC) 75 MG tablet Take 75 mg by mouth as needed for heartburn.     No current facility-administered medications for this visit.     Allergies:   Morphine and related; Phenol; and Tetracyclines & related    Social History:  The patient  reports that he has quit smoking. His smoking use included Cigarettes. He quit after 4.00 years of  use. He has never used smokeless tobacco. He reports that he drinks alcohol. He reports that he does not use drugs.   Family History:  The patient's Family history is unknown by patient.    ROS:  Please see the history of present illness.   Otherwise, review of systems are positive for none.   All other systems are reviewed and negative.    PHYSICAL EXAM: VS:  BP (!) 164/80 (BP Location: Left Arm, Patient Position: Sitting, Cuff Size: Normal)   Pulse 64   Ht 5\' 10"  (1.778 m)   Wt 212 lb 4 oz (96.3 kg)   BMI 30.45 kg/m  , BMI Body mass index is 30.45 kg/m. GEN: Well nourished, well developed, in no acute distress  HEENT: normal  Neck: no JVD, carotid bruits, or masses Cardiac: RRR; no murmurs, rubs, or gallops,no edema  Respiratory:  clear to  auscultation bilaterally, normal work of breathing GI: soft, nontender, nondistended, + BS MS: no deformity or atrophy  Skin: warm and dry, no rash Neuro:  Strength and sensation are intact Psych: euthymic mood, full affect   EKG:  EKG is ordered today. The ekg ordered today demonstrates normal sinus rhythm with right bundle branch block.   Recent Labs: No results found for requested labs within last 8760 hours.    Lipid Panel No results found for: CHOL, TRIG, HDL, CHOLHDL, VLDL, LDLCALC, LDLDIRECT    Wt Readings from Last 3 Encounters:  02/24/16 212 lb 4 oz (96.3 kg)  07/22/15 215 lb 8 oz (97.8 kg)  02/24/15 213 lb 12 oz (97 kg)      No flowsheet data found.    ASSESSMENT AND PLAN:  1.  PVCs: Symptoms are well-controlled with diltiazem. Continue same treatment.  2. Atypical chest pain in February: No recurrent chest pain and thus stress testing was not performed.  3. Essential hypertension: Blood pressure is chronically mildly elevated but he reports that her readings at home. Continue same medications for now.    Disposition:   FU with me in 1 year  Signed,  Lorine Bears, MD  02/24/2016 4:50 PM    [facility]

## 2016-02-27 ENCOUNTER — Ambulatory Visit: Payer: No Typology Code available for payment source | Admitting: Cardiovascular Disease

## 2016-04-01 ENCOUNTER — Other Ambulatory Visit: Payer: Self-pay | Admitting: Orthopedic Surgery

## 2016-04-01 DIAGNOSIS — M545 Low back pain: Secondary | ICD-10-CM

## 2016-04-13 ENCOUNTER — Ambulatory Visit
Admission: RE | Admit: 2016-04-13 | Discharge: 2016-04-13 | Disposition: A | Discharge status: Home / Self Care | Payer: Medicare Other | Source: Ambulatory Visit | Attending: Orthopedic Surgery | Admitting: Orthopedic Surgery

## 2016-04-13 DIAGNOSIS — M48061 Spinal stenosis, lumbar region without neurogenic claudication: Secondary | ICD-10-CM | POA: Insufficient documentation

## 2016-04-13 DIAGNOSIS — M5136 Other intervertebral disc degeneration, lumbar region: Secondary | ICD-10-CM | POA: Insufficient documentation

## 2016-04-13 DIAGNOSIS — M545 Low back pain: Secondary | ICD-10-CM | POA: Diagnosis not present

## 2016-07-19 ENCOUNTER — Encounter (INDEPENDENT_AMBULATORY_CARE_PROVIDER_SITE_OTHER): Payer: Self-pay

## 2016-07-19 ENCOUNTER — Ambulatory Visit (INDEPENDENT_AMBULATORY_CARE_PROVIDER_SITE_OTHER): Payer: Self-pay | Admitting: Vascular Surgery

## 2016-07-29 DIAGNOSIS — I639 Cerebral infarction, unspecified: Secondary | ICD-10-CM

## 2016-07-29 HISTORY — DX: Cerebral infarction, unspecified: I63.9

## 2016-08-20 ENCOUNTER — Emergency Department: Payer: Medicare Other

## 2016-08-20 ENCOUNTER — Emergency Department
Admission: EM | Admit: 2016-08-20 | Discharge: 2016-08-20 | Disposition: A | Discharge status: Home / Self Care | Payer: Medicare Other | Attending: Emergency Medicine | Admitting: Emergency Medicine

## 2016-08-20 ENCOUNTER — Encounter: Payer: Self-pay | Admitting: Emergency Medicine

## 2016-08-20 DIAGNOSIS — I129 Hypertensive chronic kidney disease with stage 1 through stage 4 chronic kidney disease, or unspecified chronic kidney disease: Secondary | ICD-10-CM | POA: Insufficient documentation

## 2016-08-20 DIAGNOSIS — R55 Syncope and collapse: Secondary | ICD-10-CM

## 2016-08-20 DIAGNOSIS — E039 Hypothyroidism, unspecified: Secondary | ICD-10-CM | POA: Diagnosis not present

## 2016-08-20 DIAGNOSIS — Z7982 Long term (current) use of aspirin: Secondary | ICD-10-CM | POA: Insufficient documentation

## 2016-08-20 DIAGNOSIS — Z79899 Other long term (current) drug therapy: Secondary | ICD-10-CM | POA: Diagnosis not present

## 2016-08-20 DIAGNOSIS — N189 Chronic kidney disease, unspecified: Secondary | ICD-10-CM | POA: Insufficient documentation

## 2016-08-20 DIAGNOSIS — Z87891 Personal history of nicotine dependence: Secondary | ICD-10-CM | POA: Diagnosis not present

## 2016-08-20 LAB — BASIC METABOLIC PANEL
Anion gap: 8 (ref 5–15)
BUN: 25 mg/dL — AB (ref 6–20)
CHLORIDE: 106 mmol/L (ref 101–111)
CO2: 24 mmol/L (ref 22–32)
CREATININE: 1.45 mg/dL — AB (ref 0.61–1.24)
Calcium: 8.7 mg/dL — ABNORMAL LOW (ref 8.9–10.3)
GFR, EST AFRICAN AMERICAN: 49 mL/min — AB (ref >60)
GFR, EST NON AFRICAN AMERICAN: 43 mL/min — AB (ref >60)
Glucose, Bld: 112 mg/dL — ABNORMAL HIGH (ref 65–99)
POTASSIUM: 3.9 mmol/L (ref 3.5–5.1)
SODIUM: 138 mmol/L (ref 135–145)

## 2016-08-20 LAB — CBC WITH DIFFERENTIAL/PLATELET
BASOS ABS: 0 10*3/uL (ref 0–0.1)
BASOS PCT: 0 %
EOS PCT: 3 %
Eosinophils Absolute: 0.2 10*3/uL (ref 0–0.7)
HCT: 39 % — ABNORMAL LOW (ref 40.0–52.0)
Hemoglobin: 13.7 g/dL (ref 13.0–18.0)
Lymphocytes Relative: 7 %
Lymphs Abs: 0.4 10*3/uL — ABNORMAL LOW (ref 1.0–3.6)
MCH: 32.5 pg (ref 26.0–34.0)
MCHC: 35.2 g/dL (ref 32.0–36.0)
MCV: 92.3 fL (ref 80.0–100.0)
Monocytes Absolute: 0.6 10*3/uL (ref 0.2–1.0)
Monocytes Relative: 10 %
Neutro Abs: 4.8 10*3/uL (ref 1.4–6.5)
Neutrophils Relative %: 80 %
PLATELETS: 126 10*3/uL — AB (ref 150–440)
RBC: 4.22 MIL/uL — AB (ref 4.40–5.90)
RDW: 13.6 % (ref 11.5–14.5)
WBC: 6 10*3/uL (ref 3.8–10.6)

## 2016-08-20 LAB — TROPONIN I: Troponin I: <0.03 ng/mL (ref <0.03)

## 2016-08-20 MED ORDER — PIPERACILLIN-TAZOBACTAM 3.375 G IVPB
INTRAVENOUS | Status: AC
  Filled 2016-08-20: qty 50

## 2016-08-20 MED ORDER — VANCOMYCIN HCL IN DEXTROSE 1-5 GM/200ML-% IV SOLN
INTRAVENOUS | Status: AC
  Filled 2016-08-20: qty 200

## 2016-08-20 NOTE — ED Triage Notes (Signed)
Pt found unconscious on outside deck. Pt states that he bend over, felt dizzy and woke up. FSBS 136. Pt says that he had a positive Flu test 3 weeks ago and was pretty sick. Pt still feels dizzy and light-headed. Pt also states that he has a hx of vertigo.

## 2016-08-20 NOTE — ED Provider Notes (Signed)
[facility] Emergency Department Provider Note  ____________________________________________  Time seen: Approximately 11:04 AM  I have reviewed the triage vital signs and the nursing notes.   HISTORY  Chief Complaint Loss of Consciousness    HPI Travis [name]. is a 83 y.o. male reports an episode of getting dizzy and passing out today while trying to hang a bird feeder outside. He reports that frequently if he gets up from a seated position quickly he gets dizzy. He does take a calcium channel blocker.Today, he was in his usual state of health. He got up to take the bird feeder out as he does every morning, but while walking outside he started to feel lightheaded. He leaned over to try to let the symptoms pass and set the bird feeder down, and then lost consciousness briefly. Denies any headache vision change numbness tingling weakness abdominal pain chest pain back pain shortness of breath before hand. No new symptoms afterward. Was able to get up and ambulate afterward. No neck pain. No joint pains.     Past Medical History:  Diagnosis Date  . Allergic rhinitis   . Anemia, unspecified   . Cardiac dysrhythmia, unspecified   . Carotid artery occlusion    mild  . Chronic kidney disease   . Colonic polyp   . Dizziness and giddiness   . Heel spur   . Hyperlipidemia   . Hypertension   . Hypothyroidism   . OA (osteoarthritis)   . Renal insufficiency   . Tinea cruris      Patient Active Problem List   Diagnosis Date Noted  . PVC (premature ventricular contraction) 11/23/2013  . Hypertension      Past Surgical History:  Procedure Laterality Date  . APPENDECTOMY    . BACK SURGERY    . CIRCUMCISION    . GANGLION CYST EXCISION    . MOHS SURGERY       Prior to Admission medications   Medication Sig Start Date End Date Taking? Authorizing Provider  aspirin 81 MG tablet Take 81 mg by mouth daily.   Yes Historical Provider, MD   benazepril-hydrochlorthiazide (LOTENSIN HCT) 20-12.5 MG per tablet Take 1 tablet by mouth daily.   Yes Historical Provider, MD  celecoxib (CELEBREX) 200 MG capsule Take 200 mg by mouth daily.   Yes Historical Provider, MD  diltiazem (CARDIZEM CD) 120 MG 24 hr capsule Take 1 capsule (120 mg total) by mouth daily. 09/02/15  Yes Iran Ouch, MD  doxazosin (CARDURA) 4 MG tablet Take 4 mg by mouth daily.   Yes Historical Provider, MD  finasteride (PROSCAR) 5 MG tablet Take 5 mg by mouth daily.   Yes Historical Provider, MD  levothyroxine (SYNTHROID, LEVOTHROID) 50 MCG tablet Take 50 mcg by mouth daily before breakfast.   Yes Historical Provider, MD  polyethylene glycol (MIRALAX / GLYCOLAX) packet Take 17 g by mouth daily.   Yes Historical Provider, MD  potassium chloride SA (K-DUR,KLOR-CON) 20 MEQ tablet Take 20 mEq by mouth 2 (two) times daily.   Yes Historical Provider, MD  ranitidine (ZANTAC) 75 MG tablet Take 75 mg by mouth as needed for heartburn.   Yes Historical Provider, MD  fluticasone (FLONASE) 50 MCG/ACT nasal spray Place 1 spray into both nostrils as needed for allergies or rhinitis.    Historical Provider, MD     Allergies Morphine and related; Phenol; and Tetracyclines & related   Family History  Problem Relation Age of Onset  . Family history  unknown: Yes    Social History Social History  Substance Use Topics  . Smoking status: Former Smoker    Years: 4.00    Types: Cigarettes  . Smokeless tobacco: Never Used  . Alcohol use Yes     Comment: occasional    Review of Systems  Constitutional:   No fever or chills.  ENT:   No sore throat. No rhinorrhea. Cardiovascular:   No chest pain. Respiratory:   No dyspnea or cough. Gastrointestinal:   Negative for abdominal pain, vomiting and diarrhea.  Genitourinary:   Negative for dysuria or difficulty urinating. Musculoskeletal:   Negative for focal pain or swelling Neurological:   Negative for headaches 10-point ROS  otherwise negative.  ____________________________________________   PHYSICAL EXAM:  VITAL SIGNS: ED Triage Vitals  Enc Vitals Group     BP 08/20/16 0824 (!) 163/66     Pulse Rate 08/20/16 0824 66     Resp 08/20/16 0830 15     Temp 08/20/16 0824 97.8 F (36.6 C)     Temp Source 08/20/16 0824 Oral     SpO2 08/20/16 0824 (!) 69 %     Weight 08/20/16 0824 212 lb (96.2 kg)     Height 08/20/16 0824 5\' 11"  (1.803 m)     Head Circumference --      Peak Flow --      Pain Score --      Pain Loc --      Pain Edu? --      Excl. in GC? --     Vital signs reviewed, nursing assessments reviewed.   Constitutional:   Alert and oriented. Well appearing and in no distress. Eyes:   No scleral icterus. No conjunctival pallor. PERRL. EOMI.  No nystagmus. ENT   Head:   Normocephalic and atraumatic.   Nose:   No congestion/rhinnorhea. No septal hematoma   Mouth/Throat:   MMM, no pharyngeal erythema. No peritonsillar mass.    Neck:   No stridor. No SubQ emphysema. No meningismus. Hematological/Lymphatic/Immunilogical:   No cervical lymphadenopathy. Cardiovascular:   RRR. Symmetric bilateral radial and DP pulses.  No murmurs. No orthostatic changes in blood pressure or heart rate on my exam Respiratory:   Normal respiratory effort without tachypnea nor retractions. Breath sounds are clear and equal bilaterally. No wheezes/rales/rhonchi. Gastrointestinal:   Soft and nontender. Non distended. There is no CVA tenderness.  No rebound, rigidity, or guarding. Genitourinary:   deferred Musculoskeletal:   Normal range of motion in all extremities. No joint effusions.  No lower extremity tenderness.  No edema. Neurologic:   Normal speech and language.  CN 2-10 normal. Motor grossly intact. No gross focal neurologic deficits are appreciated.  Skin:    Skin is warm, dry and intact. No rash noted.  No petechiae, purpura, or bullae.  ____________________________________________    LABS  (pertinent positives/negatives) (all labs ordered are listed, but only abnormal results are displayed) Labs Reviewed  BASIC METABOLIC PANEL - Abnormal; Notable for the following:       Result Value   Glucose, Bld 112 (*)    BUN 25 (*)    Creatinine, Ser 1.45 (*)    Calcium 8.7 (*)    GFR calc non Af Amer 43 (*)    GFR calc Af Amer 49 (*)    All other components within normal limits  CBC WITH DIFFERENTIAL/PLATELET - Abnormal; Notable for the following:    RBC 4.22 (*)    HCT 39.0 (*)    Platelets  126 (*)    Lymphs Abs 0.4 (*)    All other components within normal limits  TROPONIN I   ____________________________________________   EKG  Interpreted by me Sinus rhythm rate of 64, normal axis intervals. Right bundle branch block. Normal ST segments and T waves. Unchanged from 02/24/2016  ____________________________________________    RADIOLOGY  Ct Head Wo Contrast  Result Date: 08/20/2016 CLINICAL DATA:  Syncope and head injury. Initial encounter. EXAM: CT HEAD WITHOUT CONTRAST TECHNIQUE: Contiguous axial images were obtained from the base of the skull through the vertex without intravenous contrast. COMPARISON:  Brain MRI 05/10/2007 FINDINGS: Brain: No evidence of acute infarction, hemorrhage, hydrocephalus, extra-axial collection or mass lesion/mass effect. Age congruent generalized volume loss. Vascular: No hyperdense vessel.  Atherosclerosis. Skull: Normal. Negative for fracture or focal lesion. Sinuses/Orbits: No acute finding. IMPRESSION: No acute finding or evidence of injury. Electronically Signed   By: Marnee SpringJonathon  Watts M.D.   On: 08/20/2016 09:35    ____________________________________________   PROCEDURES Procedures  ____________________________________________   INITIAL IMPRESSION / ASSESSMENT AND PLAN / ED COURSE  Pertinent labs & imaging results that were available during my care of the patient were reviewed by me and considered in my medical decision making  (see chart for details).  Patient presents with syncope, history consistent with autonomic insufficiency. No red flag symptoms before or after the syncopal episode to suggest underlying vascular pathology. Low suspicion for ACS PE dissection AAA aneurysm in the brain or stroke. No evidence of infection or sepsis. Workup negative. No evidence of trauma. We'll discharge home to follow up with primary care. Creatinine is slightly better than baseline.         ____________________________________________   FINAL CLINICAL IMPRESSION(S) / ED DIAGNOSES  Final diagnoses:  Syncope, unspecified syncope type  Chronic kidney disease, unspecified CKD stage      New Prescriptions   No medications on file     Portions of this note were generated with dragon dictation software. Dictation errors may occur despite best attempts at proofreading.    Sharman CheekPhillip Mikael Debell, MD 08/20/16 [phone]

## 2016-08-27 ENCOUNTER — Other Ambulatory Visit: Payer: Self-pay | Admitting: Internal Medicine

## 2016-08-27 DIAGNOSIS — R55 Syncope and collapse: Secondary | ICD-10-CM

## 2016-08-31 ENCOUNTER — Other Ambulatory Visit: Payer: Self-pay

## 2016-08-31 MED ORDER — DILTIAZEM HCL ER COATED BEADS 120 MG PO CP24: 120.0000 mg | ORAL_CAPSULE | Freq: Every day | ORAL | 3 refills | Status: DC

## 2016-09-09 ENCOUNTER — Ambulatory Visit
Admission: RE | Admit: 2016-09-09 | Discharge: 2016-09-09 | Disposition: A | Discharge status: Home / Self Care | Payer: Medicare Other | Source: Ambulatory Visit | Attending: Internal Medicine | Admitting: Internal Medicine

## 2016-09-09 DIAGNOSIS — R55 Syncope and collapse: Secondary | ICD-10-CM | POA: Insufficient documentation

## 2016-09-09 DIAGNOSIS — R9082 White matter disease, unspecified: Secondary | ICD-10-CM | POA: Insufficient documentation

## 2016-09-09 DIAGNOSIS — I638 Other cerebral infarction: Secondary | ICD-10-CM | POA: Insufficient documentation

## 2016-09-20 ENCOUNTER — Encounter: Payer: Self-pay | Admitting: Cardiovascular Disease

## 2016-09-20 ENCOUNTER — Ambulatory Visit (INDEPENDENT_AMBULATORY_CARE_PROVIDER_SITE_OTHER): Payer: Medicare Other | Admitting: Cardiovascular Disease

## 2016-09-20 VITALS — BP 152/78 | HR 71 | Ht 71.0 in | Wt 208.0 lb

## 2016-09-20 DIAGNOSIS — I10 Essential (primary) hypertension: Secondary | ICD-10-CM | POA: Diagnosis not present

## 2016-09-20 DIAGNOSIS — I493 Ventricular premature depolarization: Secondary | ICD-10-CM

## 2016-09-20 DIAGNOSIS — I639 Cerebral infarction, unspecified: Secondary | ICD-10-CM

## 2016-09-20 NOTE — Patient Instructions (Addendum)
Medication Instructions:  Your physician recommends that you continue on your current medications as directed. Please refer to the Current Medication list given to you today.   Labwork: none  Testing/Procedures: Your physician has requested that you have an echocardiogram. Echocardiography is a painless test that uses sound waves to create images of your heart. It provides your doctor with information about the size and shape of your heart and how well your heart's chambers and valves are working. This procedure takes approximately one hour. There are no restrictions for this procedure.    Follow-Up: Your physician recommends that you schedule a follow-up appointment in: one month with Dr. Kirke CorinArida.    Any Other Special Instructions Will Be Listed Below (If Applicable).     If you need a refill on your cardiac medications before your next appointment, please call your pharmacy.  Echocardiogram An echocardiogram, or echocardiography, uses sound waves (ultrasound) to produce an image of your heart. The echocardiogram is simple, painless, obtained within a short period of time, and offers valuable information to your health care provider. The images from an echocardiogram can provide information such as:  Evidence of coronary artery disease (CAD).  Heart size.  Heart muscle function.  Heart valve function.  Aneurysm detection.  Evidence of a past heart attack.  Fluid buildup around the heart.  Heart muscle thickening.  Assess heart valve function. Tell a health care provider about:  Any allergies you have.  All medicines you are taking, including vitamins, herbs, eye drops, creams, and over-the-counter medicines.  Any problems you or family members have had with anesthetic medicines.  Any blood disorders you have.  Any surgeries you have had.  Any medical conditions you have.  Whether you are pregnant or may be pregnant. What happens before the procedure? No  special preparation is needed. Eat and drink normally. What happens during the procedure?  In order to produce an image of your heart, gel will be applied to your chest and a wand-like tool (transducer) will be moved over your chest. The gel will help transmit the sound waves from the transducer. The sound waves will harmlessly bounce off your heart to allow the heart images to be captured in real-time motion. These images will then be recorded.  You may need an IV to receive a medicine that improves the quality of the pictures. What happens after the procedure? You may return to your normal schedule including diet, activities, and medicines, unless your health care provider tells you otherwise. This information is not intended to replace advice given to you by your health care provider. Make sure you discuss any questions you have with your health care provider. Document Released: 06/11/2000 Document Revised: 01/31/2016 Document Reviewed: 02/19/2013 Elsevier Interactive Patient Education  2017 [employer]

## 2016-09-20 NOTE — Progress Notes (Signed)
Cardiology Office Note   Date:  09/21/2016   ID:  Dameian Crisman., DOB 1929/09/06, MRN [id number]  PCP:  Marguarite Arbour, MD  Cardiologist:   Lorine Bears, MD   Chief Complaint  Patient presents with  . other    F/u per dr sparks for syncope. Pt was under impression he was getting echo today. Reviewed meds with pt verbally.      History of Present Illness: Sherril Heyward. is a 83 y.o. male who presents for a followup visit regarding PVCs and recent stroke.  He has known history of hypertension and chronic kidney disease but no diabetes or hyperlipidemia. He is a previous smoker.   Echocardiogram in June 2015 showed normal LV systolic function, mild grade 1 diastolic dysfunction and mild aortic regurgitation. Holter monitor showed PACs (close to 4000) and PVCs (1400).   He had an episode of loss of consciousness in February. He was outside carrying birdfeeder when all of a sudden he fell with loss of consciousness. The episode was sudden was not preceded by any symptoms. He was confused after the episode and was witnessed by his son-in-law. He was taken to the emergency room was negative basic workup. He subsequently had a brain MRI which showed small subacute lacunar infarct in the anterior left MCA territory. He has been doing reasonably well with no recurrent episodes. No chest pain, shortness of breath or palpitations.   Past Medical History:  Diagnosis Date  . Allergic rhinitis   . Anemia, unspecified   . Cardiac dysrhythmia, unspecified   . Carotid artery occlusion    mild  . Chronic kidney disease   . Colonic polyp   . Dizziness and giddiness   . Heel spur   . Hyperlipidemia   . Hypertension   . Hypothyroidism   . OA (osteoarthritis)   . Renal insufficiency   . Stroke (HCC) 07/2016   mini   . Tinea cruris     Past Surgical History:  Procedure Laterality Date  . APPENDECTOMY    . BACK SURGERY    . CIRCUMCISION    . GANGLION CYST EXCISION    . MOHS  SURGERY       Current Outpatient Prescriptions  Medication Sig Dispense Refill  . acetaminophen (TYLENOL) 500 MG tablet Take 500 mg by mouth daily as needed.    . benazepril-hydrochlorthiazide (LOTENSIN HCT) 20-12.5 MG per tablet Take 1 tablet by mouth daily.    . celecoxib (CELEBREX) 200 MG capsule Take 200 mg by mouth daily.    . clopidogrel (PLAVIX) 75 MG tablet Take 75 mg by mouth daily.    Marland Kitchen diltiazem (CARDIZEM CD) 120 MG 24 hr capsule Take 1 capsule (120 mg total) by mouth daily. 90 capsule 3  . doxazosin (CARDURA) 4 MG tablet Take 4 mg by mouth daily.    . finasteride (PROSCAR) 5 MG tablet Take 5 mg by mouth daily.    . fluticasone (FLONASE) 50 MCG/ACT nasal spray Place 1 spray into both nostrils as needed for allergies or rhinitis.    Marland Kitchen levothyroxine (SYNTHROID, LEVOTHROID) 50 MCG tablet Take 50 mcg by mouth daily before breakfast.    . meclizine (ANTIVERT) 25 MG tablet Take 25 mg by mouth 3 (three) times daily as needed.    . polyethylene glycol (MIRALAX / GLYCOLAX) packet Take 17 g by mouth daily.    . potassium chloride SA (K-DUR,KLOR-CON) 20 MEQ tablet Take 20 mEq by mouth 2 (two) times daily.    Marland Kitchen  ranitidine (ZANTAC) 75 MG tablet Take 75 mg by mouth as needed for heartburn.     No current facility-administered medications for this visit.     Allergies:   Morphine and related; Phenol; and Tetracyclines & related    Social History:  The patient  reports that he has quit smoking. His smoking use included Cigarettes. He quit after 4.00 years of use. He has never used smokeless tobacco. He reports that he drinks alcohol. He reports that he does not use drugs.   Family History:  The patient's family history includes Dementia in his mother; Hypertension in his mother; Lung cancer in his sister; Myasthenia gravis in his father.    ROS:  Please see the history of present illness.   Otherwise, review of systems are positive for none.   All other systems are reviewed and negative.     PHYSICAL EXAM: VS:  BP (!) 152/78 (BP Location: Left Arm, Patient Position: Sitting, Cuff Size: Normal)   Pulse 71   Ht 5\' 11"  (1.803 m)   Wt 208 lb (94.3 kg)   BMI 29.01 kg/m  , BMI Body mass index is 29.01 kg/m. GEN: Well nourished, well developed, in no acute distress  HEENT: normal  Neck: no JVD, carotid bruits, or masses Cardiac: RRR; no murmurs, rubs, or gallops,no edema  Respiratory:  clear to auscultation bilaterally, normal work of breathing GI: soft, nontender, nondistended, + BS MS: no deformity or atrophy  Skin: warm and dry, no rash Neuro:  Strength and sensation are intact Psych: euthymic mood, full affect   EKG:  EKG is ordered today. The ekg ordered today demonstrates normal sinus rhythm with right bundle branch block.   Recent Labs: 08/20/2016: BUN 25; Creatinine, Ser 1.45; Hemoglobin 13.7; Platelets 126; Potassium 3.9; Sodium 138    Lipid Panel No results found for: CHOL, TRIG, HDL, CHOLHDL, VLDL, LDLCALC, LDLDIRECT    Wt Readings from Last 3 Encounters:  09/20/16 208 lb (94.3 kg)  08/20/16 212 lb (96.2 kg)  02/24/16 212 lb 4 oz (96.3 kg)      No flowsheet data found.    ASSESSMENT AND PLAN:  1.  PVCs: Symptoms are well-controlled with diltiazem. Continue same treatment.  2. Recent small stroke in the left MCA territory: I requested an echocardiogram to ensure no cardiac source of embolism. The patient is known to have left carotid disease and is going to follow-up with Dr. Gilda CreaseSchnier. If the stroke was felt to be embolic with no other explanation for his stroke, a 30 day outpatient telemetry should be considered to exclude atrial fibrillation. Consider neurology consultation.  3. Essential hypertension: He is known to have white coat syndrome. Given his recent stroke, no other medications were added today.   Disposition:   FU with me in 1 year  Signed,  Lorine BearsMuhammad Chesley Valls, MD  09/21/2016 1:55 PM    [facility]

## 2016-10-26 ENCOUNTER — Other Ambulatory Visit: Payer: Self-pay

## 2016-10-26 ENCOUNTER — Ambulatory Visit (INDEPENDENT_AMBULATORY_CARE_PROVIDER_SITE_OTHER): Payer: Medicare Other

## 2016-10-26 DIAGNOSIS — I639 Cerebral infarction, unspecified: Secondary | ICD-10-CM | POA: Diagnosis not present

## 2016-10-27 ENCOUNTER — Encounter (INDEPENDENT_AMBULATORY_CARE_PROVIDER_SITE_OTHER): Payer: Self-pay

## 2016-10-27 ENCOUNTER — Ambulatory Visit (INDEPENDENT_AMBULATORY_CARE_PROVIDER_SITE_OTHER): Payer: Self-pay | Admitting: Vascular Surgery

## 2016-11-02 ENCOUNTER — Ambulatory Visit: Payer: Medicare Other | Admitting: Cardiovascular Disease

## 2016-11-16 ENCOUNTER — Ambulatory Visit: Payer: Medicare Other | Admitting: Cardiovascular Disease

## 2017-02-24 ENCOUNTER — Encounter: Payer: Self-pay | Admitting: Cardiovascular Disease

## 2017-02-24 ENCOUNTER — Ambulatory Visit (INDEPENDENT_AMBULATORY_CARE_PROVIDER_SITE_OTHER): Payer: Medicare Other | Admitting: Cardiovascular Disease

## 2017-02-24 ENCOUNTER — Ambulatory Visit: Payer: Medicare Other | Admitting: Cardiovascular Disease

## 2017-02-24 VITALS — BP 160/60 | HR 73 | Ht 71.0 in | Wt 209.5 lb

## 2017-02-24 DIAGNOSIS — I10 Essential (primary) hypertension: Secondary | ICD-10-CM

## 2017-02-24 DIAGNOSIS — I639 Cerebral infarction, unspecified: Secondary | ICD-10-CM

## 2017-02-24 DIAGNOSIS — I493 Ventricular premature depolarization: Secondary | ICD-10-CM | POA: Diagnosis not present

## 2017-02-24 NOTE — Progress Notes (Signed)
Cardiology Office Note   Date:  02/24/2017   ID:  Travis Privitera., DOB August 16, 1929, MRN [id number]  PCP:  Marguarite Arbour, MD  Cardiologist:   Lorine Bears, MD   Chief Complaint  Patient presents with  . other    5 month follow up. Meds reviewed by the pt. verbally. "doing well."       History of Present Illness: Travis Alcorta. is a 83 y.o. male who presents for a followup visit regarding PVCs .  He has known history of hypertension and chronic kidney disease but no diabetes or hyperlipidemia. He is a previous smoker.   Echocardiogram in June 2015 showed normal LV systolic function, mild grade 1 diastolic dysfunction and mild aortic regurgitation. Holter monitor showed PACs (close to 4000) and PVCs (1400).   He had a stroke in February of this year due to small subacute the corner infarct in the anterior left MCA territory. He has been on Plavix since then. Echocardiogram was done in May of this year showed normal LV systolic function with grade 1 diastolic dysfunction and very mild degree of aortic stenosis with a mean gradient of 7 mmHg.  He has been doing well and denies any chest pain, shortness of breath or palpitations.  Past Medical History:  Diagnosis Date  . Allergic rhinitis   . Anemia, unspecified   . Cardiac dysrhythmia, unspecified   . Carotid artery occlusion    mild  . Chronic kidney disease   . Colonic polyp   . Dizziness and giddiness   . Heel spur   . Hyperlipidemia   . Hypertension   . Hypothyroidism   . OA (osteoarthritis)   . Renal insufficiency   . Stroke (HCC) 07/2016   mini   . Tinea cruris     Past Surgical History:  Procedure Laterality Date  . APPENDECTOMY    . BACK SURGERY    . CIRCUMCISION    . GANGLION CYST EXCISION    . MOHS SURGERY       Current Outpatient Prescriptions  Medication Sig Dispense Refill  . acetaminophen (TYLENOL) 500 MG tablet Take 500 mg by mouth daily as needed.    .  benazepril-hydrochlorthiazide (LOTENSIN HCT) 20-12.5 MG per tablet Take 1 tablet by mouth daily.    . celecoxib (CELEBREX) 200 MG capsule Take 200 mg by mouth daily.    . clopidogrel (PLAVIX) 75 MG tablet Take 75 mg by mouth daily.    Marland Kitchen diltiazem (CARDIZEM CD) 120 MG 24 hr capsule Take 1 capsule (120 mg total) by mouth daily. 90 capsule 3  . doxazosin (CARDURA) 4 MG tablet Take 4 mg by mouth daily.    . finasteride (PROSCAR) 5 MG tablet Take 5 mg by mouth daily.    . fluticasone (FLONASE) 50 MCG/ACT nasal spray Place 1 spray into both nostrils as needed for allergies or rhinitis.    Marland Kitchen levothyroxine (SYNTHROID, LEVOTHROID) 50 MCG tablet Take 50 mcg by mouth daily before breakfast.    . polyethylene glycol (MIRALAX / GLYCOLAX) packet Take 17 g by mouth daily.    . potassium chloride SA (K-DUR,KLOR-CON) 20 MEQ tablet Take 20 mEq by mouth 2 (two) times daily.    . ranitidine (ZANTAC) 75 MG tablet Take 75 mg by mouth as needed for heartburn.     No current facility-administered medications for this visit.     Allergies:   Morphine and related; Phenol; and Tetracyclines & related    Social  History:  The patient  reports that he has quit smoking. His smoking use included Cigarettes. He quit after 4.00 years of use. He has never used smokeless tobacco. He reports that he drinks alcohol. He reports that he does not use drugs.   Family History:  The patient's family history includes Dementia in his mother; Hypertension in his mother; Lung cancer in his sister; Myasthenia gravis in his father.    ROS:  Please see the history of present illness.   Otherwise, review of systems are positive for none.   All other systems are reviewed and negative.    PHYSICAL EXAM: VS:  BP (!) 160/60 (BP Location: Left Arm, Patient Position: Sitting, Cuff Size: Normal)   Pulse 73   Ht 5\' 11"  (1.803 m)   Wt 209 lb 8 oz (95 kg)   BMI 29.22 kg/m  , BMI Body mass index is 29.22 kg/m. GEN: Well nourished, well  developed, in no acute distress  HEENT: normal  Neck: no JVD, carotid bruits, or masses Cardiac: RRR; no rubs, or gallops,no edema . 2/7 SEM in aortic area.  Respiratory:  clear to auscultation bilaterally, normal work of breathing GI: soft, nontender, nondistended, + BS MS: no deformity or atrophy  Skin: warm and dry, no rash Neuro:  Strength and sensation are intact Psych: euthymic mood, full affect   EKG:  EKG is ordered today. The ekg ordered today demonstrates normal sinus rhythm with right bundle branch block.   Recent Labs: 08/20/2016: BUN 25; Creatinine, Ser 1.45; Hemoglobin 13.7; Platelets 126; Potassium 3.9; Sodium 138    Lipid Panel No results found for: CHOL, TRIG, HDL, CHOLHDL, VLDL, LDLCALC, LDLDIRECT    Wt Readings from Last 3 Encounters:  02/24/17 209 lb 8 oz (95 kg)  09/20/16 208 lb (94.3 kg)  08/20/16 212 lb (96.2 kg)      No flowsheet data found.    ASSESSMENT AND PLAN:  1.  PVCs: Symptoms are well-controlled with diltiazem. Continue same treatment.  2.Left carotid stenosis: Followed by primary care physician. He had a follow-up Doppler in April but I don't have the results.  3. Essential hypertension: He is known to have white coat syndrome. Blood pressure is mildly elevated today. Continue to monitor.  4. Mild aortic stenosis: We'll consider repeat echocardiogram in few years.   Disposition:   FU with me in 1 year  Signed,  Lorine BearsMuhammad Ovida Delagarza, MD  02/24/2017 1:21 PM    [facility]

## 2017-02-24 NOTE — Patient Instructions (Signed)
Medication Instructions: Continue same medications.   Labwork: None.   Procedures/Testing: None.   Follow-Up: 1 year with Dr. Sona Nations.   Any Additional Special Instructions Will Be Listed Below (If Applicable).     If you need a refill on your cardiac medications before your next appointment, please call your pharmacy.   

## 2017-03-02 ENCOUNTER — Encounter: Payer: Self-pay | Admitting: Student in an Organized Health Care Education/Training Program

## 2017-03-02 ENCOUNTER — Ambulatory Visit
Payer: Medicare Other | Attending: Student in an Organized Health Care Education/Training Program | Admitting: Student in an Organized Health Care Education/Training Program

## 2017-03-02 VITALS — BP 164/55 | HR 77 | Temp 98.0°F | Resp 18 | Ht 71.0 in | Wt 209.0 lb

## 2017-03-02 DIAGNOSIS — G8929 Other chronic pain: Secondary | ICD-10-CM | POA: Diagnosis not present

## 2017-03-02 DIAGNOSIS — D649 Anemia, unspecified: Secondary | ICD-10-CM | POA: Insufficient documentation

## 2017-03-02 DIAGNOSIS — M25551 Pain in right hip: Secondary | ICD-10-CM | POA: Diagnosis present

## 2017-03-02 DIAGNOSIS — E039 Hypothyroidism, unspecified: Secondary | ICD-10-CM | POA: Insufficient documentation

## 2017-03-02 DIAGNOSIS — Z8249 Family history of ischemic heart disease and other diseases of the circulatory system: Secondary | ICD-10-CM | POA: Diagnosis not present

## 2017-03-02 DIAGNOSIS — N189 Chronic kidney disease, unspecified: Secondary | ICD-10-CM | POA: Insufficient documentation

## 2017-03-02 DIAGNOSIS — M5136 Other intervertebral disc degeneration, lumbar region: Secondary | ICD-10-CM | POA: Diagnosis not present

## 2017-03-02 DIAGNOSIS — Z885 Allergy status to narcotic agent status: Secondary | ICD-10-CM | POA: Insufficient documentation

## 2017-03-02 DIAGNOSIS — I129 Hypertensive chronic kidney disease with stage 1 through stage 4 chronic kidney disease, or unspecified chronic kidney disease: Secondary | ICD-10-CM | POA: Diagnosis not present

## 2017-03-02 DIAGNOSIS — E785 Hyperlipidemia, unspecified: Secondary | ICD-10-CM | POA: Insufficient documentation

## 2017-03-02 DIAGNOSIS — M5116 Intervertebral disc disorders with radiculopathy, lumbar region: Secondary | ICD-10-CM | POA: Diagnosis not present

## 2017-03-02 DIAGNOSIS — Z87891 Personal history of nicotine dependence: Secondary | ICD-10-CM | POA: Insufficient documentation

## 2017-03-02 DIAGNOSIS — Z86010 Personal history of colonic polyps: Secondary | ICD-10-CM | POA: Insufficient documentation

## 2017-03-02 DIAGNOSIS — M4696 Unspecified inflammatory spondylopathy, lumbar region: Secondary | ICD-10-CM | POA: Diagnosis not present

## 2017-03-02 DIAGNOSIS — Z801 Family history of malignant neoplasm of trachea, bronchus and lung: Secondary | ICD-10-CM | POA: Diagnosis not present

## 2017-03-02 DIAGNOSIS — Z9889 Other specified postprocedural states: Secondary | ICD-10-CM | POA: Diagnosis not present

## 2017-03-02 DIAGNOSIS — Z8673 Personal history of transient ischemic attack (TIA), and cerebral infarction without residual deficits: Secondary | ICD-10-CM | POA: Insufficient documentation

## 2017-03-02 DIAGNOSIS — M545 Low back pain: Secondary | ICD-10-CM | POA: Diagnosis not present

## 2017-03-02 DIAGNOSIS — M5416 Radiculopathy, lumbar region: Secondary | ICD-10-CM

## 2017-03-02 DIAGNOSIS — M47816 Spondylosis without myelopathy or radiculopathy, lumbar region: Secondary | ICD-10-CM | POA: Insufficient documentation

## 2017-03-02 NOTE — Progress Notes (Signed)
Patient's Name: Travis [name].  MRN: [id number]  Referring Provider: Deetta Perla, MD  DOB: 11-16-1929  PCP: Idelle Crouch, MD  DOS: 03/02/2017  Note by: Gillis Santa, MD  Service setting: Ambulatory outpatient  Specialty: Interventional Pain Management  Location: [facility] (AMB) Pain Management Facility    Patient type: New patient ("FAST-TRACK" Evaluation)   Warning: This referral option does not include the extensive pharmacological evaluation required for [place] to take over the patient's medication management. The "Fast-Track" system is designed to bypass the new patient referral waiting list, as well as the normal patient evaluation process, in order to provide a patient in distress with a timely pain management intervention. Because the system was not designed to unfairly get a patient into our pain practice ahead of those already waiting, certain restrictions apply. By requesting a "Fast-Track" consult, the referring physician has opted to continue managing the patient's medications in order to get interventional urgent care.  Primary Reason for Visit: Interventional Pain Management Treatment. CC: Hip Pain (right radiates to right buttock)   Procedure  HPI  Mr. Bringhurst is a 83 y.o. year old, male patient, who comes today for a  "Fast-Track" new patient evaluation, as requested by Deetta Perla, MD. The patient has been made aware that this type of referral option is reserved for the Interventional Pain Management portion of our practice and completely excludes the option of medication management. His primarily concern today is the Hip Pain (right radiates to right buttock)  Pain Assessment: Location: Right Hip Radiating:   Onset: More than a month ago Duration: Chronic pain Quality: Stabbing Severity: 5 /10 (self-reported pain score)  Note: Reported level is compatible with observation.                   Effect on ADL:   Timing: Intermittent Modifying factors: resting, worse upon  awaking  Onset and Duration: Present longer than 3 months Cause of pain: Unknown Severity: No change since onset, NAS-11 at its worse: 6/10, NAS-11 at its best: 1/10, NAS-11 now: 2/10 and NAS-11 on the average: 3/10 Timing: Morning Aggravating Factors: Bending, Lifiting, Motion, Prolonged standing, Squatting and Twisting Alleviating Factors: Sitting Associated Problems: Night-time cramps, Dizziness, Erectile dysfunction, Numbness and Pain that wakes patient up Quality of Pain: Sharp Previous Examinations or Tests: MRI scan, Myelogram and Neurosurgical evaluation Previous Treatments: Epidural steroid injections, Physical Therapy and TENS  The patient comes into the clinics today, referred to [place] from Dr Lacinda Axon for a L2/L3 ESI for Right L3 radiculopathy.   Meds   Current Outpatient Prescriptions:  .  acetaminophen (TYLENOL) 500 MG tablet, Take 500 mg by mouth daily as needed., Disp: , Rfl:  .  benazepril-hydrochlorthiazide (LOTENSIN HCT) 20-12.5 MG per tablet, Take 1 tablet by mouth daily., Disp: , Rfl:  .  celecoxib (CELEBREX) 200 MG capsule, Take 200 mg by mouth daily., Disp: , Rfl:  .  clopidogrel (PLAVIX) 75 MG tablet, Take 75 mg by mouth daily., Disp: , Rfl:  .  diltiazem (CARDIZEM CD) 120 MG 24 hr capsule, Take 1 capsule (120 mg total) by mouth daily., Disp: 90 capsule, Rfl: 3 .  doxazosin (CARDURA) 4 MG tablet, Take 4 mg by mouth daily., Disp: , Rfl:  .  finasteride (PROSCAR) 5 MG tablet, Take 5 mg by mouth daily., Disp: , Rfl:  .  fluticasone (FLONASE) 50 MCG/ACT nasal spray, Place 1 spray into both nostrils as needed for allergies or rhinitis., Disp: , Rfl:  .  levothyroxine (SYNTHROID,  LEVOTHROID) 50 MCG tablet, Take 50 mcg by mouth daily before breakfast., Disp: , Rfl:  .  polyethylene glycol (MIRALAX / GLYCOLAX) packet, Take 17 g by mouth daily., Disp: , Rfl:  .  potassium chloride SA (K-DUR,KLOR-CON) 20 MEQ tablet, Take 20 mEq by mouth 2 (two) times daily., Disp: , Rfl:  .   ranitidine (ZANTAC) 75 MG tablet, Take 75 mg by mouth as needed for heartburn., Disp: , Rfl:   Imaging Review    Lumbosacral Imaging: Lumbar MR wo contrast:  Results for orders placed during the hospital encounter of 04/13/16  MR Lumbar Spine Wo Contrast   Narrative CLINICAL DATA:  Low back pain with pain in the left hip radiating down the left leg to the toes with numbness and tingling. Symptoms for 1 month.  EXAM: MRI LUMBAR SPINE WITHOUT CONTRAST  TECHNIQUE: Multiplanar, multisequence MR imaging of the lumbar spine was performed. No intravenous contrast was administered.  COMPARISON:  03/02/2012  FINDINGS: Segmentation: Normal lumbar segmentation is designated, with the lowest fully formed disc space labeled L5-S1.  Alignment:  Unchanged alignment without significant listhesis.  Vertebrae: Preserved vertebral body heights without evidence of fracture. Diffuse lumbar disc desiccation with mild disc space narrowing throughout the lumbar spine with the exception of L3-4. Disc space narrowing at L4-5 is new, and there is new mild-to-moderate degenerative endplate edema the centric to the right.  Conus medullaris: Extends to the upper L2 level and appears normal.  Paraspinal and other soft tissues: Subcentimeter T2 hyperintense bilateral renal lesions, likely cysts.  Disc levels:  T12-L1: Mild facet hypertrophy without disc herniation or stenosis, unchanged.  L1-2: Mild disc bulging, mild ligamentum flavum thickening, and mild-to-moderate facet hypertrophy without significant stenosis, unchanged.  L2-3: Circumferential disc bulging, broad right foraminal to extraforaminal disc protrusion, and mild-to-moderate facet and ligamentum flavum hypertrophy result in mild spinal stenosis and mild right greater than left lateral recess stenosis, progressed from prior. There is a new small disc protrusion in the inferior aspect of the left neural foramen. No significant  neural foraminal stenosis.  L3-4: Mild disc bulging, mild ligamentum flavum thickening, and mild moderate facet hypertrophy without significant stenosis, unchanged.  L4-5: Circumferential disc bulging, right foraminal/ extraforaminal disc protrusion, and moderate facet and ligamentum flavum hypertrophy result in mild spinal stenosis and mild bilateral lateral recess stenosis, mildly progressed from prior. Mild right neural foraminal stenosis.  L5-S1: Prior left laminotomy again noted. Mild circumferential disc bulging and mild facet hypertrophy result in mild left neural foraminal stenosis, unchanged. No spinal stenosis.  IMPRESSION: Progressive disc degeneration at L2-3 and L4-5 with mild spinal and lateral recess stenosis at both levels.   Electronically Signed   By: Logan Bores M.D.   On: 04/13/2016 12:57    Lumbar MR wo contrast:  Results for orders placed in visit on 03/02/12  [place] W/O Cm   Narrative   PRIOR REPORT IMPORTED FROM THE [facility] WORKFLOW SYSTEM   REASON FOR EXAM:    Degenerative disc disease back pain  COMMENTS:   PROCEDURE:     MR  - MR LUMBAR SPINE WO CONTRAST  - Mar 02 2012  6:17PM   RESULT:     MRI LUMBAR SPINE WITHOUT CONTRAST   HISTORY: Back pain   COMPARISON: None   TECHNIQUE: Multiplanar and multisequence MRI of the lumbar spine were  obtained, without administration of IV contrast.   FINDINGS:   The vertebral bodies of the lumbar spine are normal in size and  alignment.  There is normal bone marrow signal demonstrated throughout the vertebra.  The  intervertebral disc spaces are well-maintained.   The spinal cord is of normal volume and contour. The cord terminates  normally at L1 . The nerve roots of the cauda equina and the filum  terminale  have the usual appearance.   The visualized portions of the SI joints are unremarkable.   The imaged intra-abdominal contents are unremarkable.   T12-L1: No significant disc bulge.  No evidence of neural foraminal or  central stenosis.   L1-L2: Mild broad-based disc bulge. Mild bilateral facet arthropathy. No  evidence of neural foraminal or central stenosis.   L2-L3: Moderate broad-based disc bulge with a tiny right paracentral disc  protrusion which abuts the intraspinal nerve roots. Mild bilateral facet  arthropathy. No evidence of neural foraminal or central stenosis.   L3-L4: Mild broad-based disc bulge. Moderate bilateral facet arthropathy.  No  evidence of neural foraminal or central stenosis.   L4-L5: There is a small right foraminal disc protrusion which abuts the  right foraminal L4 nerve root. There is moderate right foraminal stenosis.  There is no left foraminal stenosis. There is no central canal stenosis.   L5-S1: No significant disc bulge. No evidence of neural foraminal or  central  stenosis. Bilateral facet arthropathy, left greater than right.   IMPRESSION:   1. Lumbar spine spondylosis as described above.   Dictation Site: 1         Complexity Note: Imaging results reviewed. Results discussed using Layman's terms.               ROS  Cardiovascular History: Abnormal heart rhythm, Daily Aspirin intake, High blood pressure and Blood thinners:  Antiplatelet Pulmonary or Respiratory History: No reported pulmonary signs or symptoms such as wheezing and difficulty taking a deep full breath (Asthma), difficulty blowing air out (Emphysema), coughing up mucus (Bronchitis), persistent dry cough, or temporary stoppage of breathing during sleep Neurological History: Stroke (Residual deficits or weakness: mini stroke) Review of Past Neurological Studies:  Results for orders placed or performed during the hospital encounter of 09/09/16  MR BRAIN WO CONTRAST   Addendum: 09/09/2016   ADDENDUM REPORT: 09/09/2016 09:08  ADDENDUM: Study discussed by telephone with Dr. Fulton Reek on 09/09/2016 at 0859 hours.   Electronically Signed   By: Genevie Ann M.D.   On: 09/09/2016 09:08     Narrative   CLINICAL DATA:  83 year old male with syncope and fall at the end of February. Subsequent dizziness and unsteady feeling. Initial encounter.  EXAM: MRI HEAD WITHOUT CONTRAST  TECHNIQUE: Multiplanar, multiecho pulse sequences of the brain and surrounding structures were obtained without intravenous contrast.  COMPARISON:  Noncontrast head CT 08/20/2016.  Brain MRI 05/10/2007.  FINDINGS: Brain: Cerebral volume remains normal for age, mild generalized volume loss since 2008. No midline shift, mass effect, evidence of mass lesion, ventriculomegaly, extra-axial collection or acute intracranial hemorrhage. Cervicomedullary junction and pituitary are within normal limits.  There is a small 7 mm linear area of mildly increased trace diffusion in the left anterior frontal lobe subcortical white matter (series 5, image 31 and series 8, image 24) with isointense signal on ADC. T2 and FLAIR hyperintensity is associated and similar to other scattered bilateral cerebral white matter T2 and FLAIR heterogeneity which has progressed since 2008, but is still fairly mild for age. No associated hemorrhage or mass effect. No other abnormal diffusion and no acutely restricted diffusion identified.  Possible small area  of cortical encephalomalacia in the left superior frontal gyrus on series 10, image 23. Cerebral cortex elsewhere appears normal. Deep gray matter nuclei, brainstem, and cerebellum are normal for age. No chronic cerebral blood products identified.  Vascular: Major intracranial vascular flow voids are preserved and appear stable since 2008. Mildly dominant distal left vertebral artery.  Skull and upper cervical spine: Negative. Visualized bone marrow signal is within normal limits.  Sinuses/Orbits: Normal orbits soft tissues.  Visualized paranasal sinuses and mastoids are stable and well pneumatized.  Other: Visible internal  auditory structures appear normal. Negative scalp soft tissues.  IMPRESSION: 1. Small subacute appearing subcortical white matter lacunar infarct in the anterior left MCA territory. No associated hemorrhage or mass effect. 2. Superimposed bilateral white matter signal changes elsewhere have progressed since 2008, are also compatible with chronic small vessel disease, but are still fairly mild for age.  Electronically Signed: By: Genevie Ann M.D. On: 09/09/2016 08:47   Results for orders placed or performed during the hospital encounter of 08/20/16  CT Head Wo Contrast   Narrative   CLINICAL DATA:  Syncope and head injury. Initial encounter.  EXAM: CT HEAD WITHOUT CONTRAST  TECHNIQUE: Contiguous axial images were obtained from the base of the skull through the vertex without intravenous contrast.  COMPARISON:  Brain MRI 05/10/2007  FINDINGS: Brain: No evidence of acute infarction, hemorrhage, hydrocephalus, extra-axial collection or mass lesion/mass effect. Age congruent generalized volume loss.  Vascular: No hyperdense vessel.  Atherosclerosis.  Skull: Normal. Negative for fracture or focal lesion.  Sinuses/Orbits: No acute finding.  IMPRESSION: No acute finding or evidence of injury.   Electronically Signed   By: Monte Fantasia M.D.   On: 08/20/2016 09:35   Results for orders placed or performed in visit on 05/10/07  MR Brain W Wo Contrast   Narrative     PRIOR REPORT IMPORTED FROM THE [facility] WORKFLOW SYSTEM   REASON FOR EXAM:    visual change   eval CVA  COMMENTS:   PROCEDURE:     MR  - MR BRAIN WO/W CONTRAST  - May 10 2007  3:19PM   RESULT:   HISTORY:    Vision loss.   COMPARISON STUDIES:  MRI of the brain of 01/03/06.   PROCEDURE AND FINDINGS:  Multiplanar/multisequence imaging of the brain  was  obtained. No evidence of mass lesion or enhancing lesion.  Diffusion  weighted images reveal no evidence of acute ischemia.  There is a small   interhemispheric lipoma. Similar finding was noted on a prior study of  01/03/06.  Mild deep white matter changes are noted consistent with chronic  ischemia.   IMPRESSION:   1.     Tiny lipoma in the interhemispheric fissure unchanged from 01/03/06.  2.     No acute or focal abnormalities are identified.   Thank you for the opportunity to contribute to the care of your patient.       Psychological-Psychiatric History: No reported psychological or psychiatric signs or symptoms such as difficulty sleeping, anxiety, depression, delusions or hallucinations (schizophrenial), mood swings (bipolar disorders) or suicidal ideations or attempts Gastrointestinal History: Reflux or heatburn Genitourinary History: No reported renal or genitourinary signs or symptoms such as difficulty voiding or producing urine, peeing blood, non-functioning kidney, kidney stones, difficulty emptying the bladder, difficulty controlling the flow of urine, or chronic kidney disease Hematological History: Brusing easily Endocrine History: Slow thyroid Rheumatologic History: No reported rheumatological signs and symptoms such as fatigue, joint pain, tenderness, swelling, redness,  heat, stiffness, decreased range of motion, with or without associated rash Musculoskeletal History: Negative for myasthenia gravis, muscular dystrophy, multiple sclerosis or malignant hyperthermia Work History: Retired  Allergies  Mr. Sudbeck is allergic to morphine and related; phenol; and tetracyclines & related.  Laboratory Chemistry  Inflammation Markers (CRP: Acute Phase) (ESR: Chronic Phase) No results found for: CRP, ESRSEDRATE               Renal Function Markers Lab Results  Component Value Date   BUN 25 (H) 08/20/2016   CREATININE 1.45 (H) 08/20/2016   GFRAA 49 (L) 08/20/2016   GFRNONAA 43 (L) 08/20/2016                 Hepatic Function Markers No results found for: AST, ALT, ALBUMIN, ALKPHOS, HCVAB                Electrolytes Lab Results  Component Value Date   NA 138 08/20/2016   K 3.9 08/20/2016   CL 106 08/20/2016   CALCIUM 8.7 (L) 08/20/2016                 Neuropathy Markers No results found for: [id number]               Bone Pathology Markers Lab Results  Component Value Date   CALCIUM 8.7 (L) 08/20/2016                 Coagulation Parameters Lab Results  Component Value Date   PLT 126 (L) 08/20/2016                 Cardiovascular Markers Lab Results  Component Value Date   HGB 13.7 08/20/2016   HCT 39.0 (L) 08/20/2016                 Note: Lab results reviewed.  [place]  Drug: Mr. Stolar  reports that he does not use drugs. Alcohol:  reports that he drinks alcohol. Tobacco:  reports that he has quit smoking. His smoking use included Cigarettes. He quit after 4.00 years of use. He has never used smokeless tobacco. Medical:  has a past medical history of Allergic rhinitis; Anemia, unspecified; Cardiac dysrhythmia, unspecified; Carotid artery occlusion; Chronic kidney disease; Colonic polyp; Dizziness and giddiness; Heel spur; Hyperlipidemia; Hypertension; Hypothyroidism; OA (osteoarthritis); Renal insufficiency; Stroke ([place]) (07/2016); and Tinea cruris. Family: family history includes Dementia in his mother; Hypertension in his mother; Lung cancer in his sister; Myasthenia gravis in his father.  Past Surgical History:  Procedure Laterality Date  . APPENDECTOMY    . BACK SURGERY    . CIRCUMCISION    . GANGLION CYST EXCISION    . MOHS SURGERY     Active Ambulatory Problems    Diagnosis Date Noted  . Hypertension   . PVC (premature ventricular contraction) 11/23/2013   Resolved Ambulatory Problems    Diagnosis Date Noted  . No Resolved Ambulatory Problems   Past Medical History:  Diagnosis Date  . Allergic rhinitis   . Anemia, unspecified   . Cardiac dysrhythmia, unspecified   . Carotid artery occlusion   . Chronic kidney disease   . Colonic polyp   .  Dizziness and giddiness   . Heel spur   . Hyperlipidemia   . Hypertension   . Hypothyroidism   . OA (osteoarthritis)   . Renal insufficiency   . Stroke ([place]) 07/2016  . Tinea cruris    Constitutional Exam  General appearance: Well nourished, well developed, and well  hydrated. In no apparent acute distress Vitals:   03/02/17 1415  BP: (!) 164/55  Pulse: 77  Resp: 18  Temp: 98 F (36.7 C)  TempSrc: Oral  SpO2: 98%  Weight: 209 lb (94.8 kg)  Height: 5' 11"  (1.803 m)   BMI Assessment: Estimated body mass index is 29.15 kg/m as calculated from the following:   Height as of this encounter: 5' 11"  (1.803 m).   Weight as of this encounter: 209 lb (94.8 kg).  BMI interpretation table: BMI level Category Range association with higher incidence of chronic pain  <18 kg/m2 Underweight   18.5-24.9 kg/m2 Ideal body weight   25-29.9 kg/m2 Overweight Increased incidence by 20%  30-34.9 kg/m2 Obese (Class I) Increased incidence by 68%  35-39.9 kg/m2 Severe obesity (Class II) Increased incidence by 136%  >40 kg/m2 Extreme obesity (Class III) Increased incidence by 254%   BMI Readings from Last 4 Encounters:  03/02/17 29.15 kg/m  02/24/17 29.22 kg/m  09/20/16 29.01 kg/m  08/20/16 29.57 kg/m   Wt Readings from Last 4 Encounters:  03/02/17 209 lb (94.8 kg)  02/24/17 209 lb 8 oz (95 kg)  09/20/16 208 lb (94.3 kg)  08/20/16 212 lb (96.2 kg)  Psych/Mental status: Alert, oriented x 3 (person, place, & time)       Eyes: PERLA Respiratory: No evidence of acute respiratory distress  Lumbar Spine Area Exam  Skin & Axial Inspection: Well healed scar from previous spine surgery detected Alignment: Symmetrical Functional ROM: Decreased ROM      Stability: No instability detected Muscle Tone/Strength: Functionally intact. No obvious neuro-muscular anomalies detected. Sensory (Neurological): Movement-associated pain Palpation: Complains of area being tender to palpation        Provocative Tests: Lumbar Hyperextension and rotation test: Positive       Lumbar Lateral bending test: Positive       Patrick's Maneuver: evaluation deferred today                    Gait & Posture Assessment  Ambulation: Unassisted Gait: Relatively normal for age and body habitus Posture: WNL   Lower Extremity Exam    Side: Right lower extremity  Side: Left lower extremity  Skin & Extremity Inspection: Skin color, temperature, and hair growth are WNL. No peripheral edema or cyanosis. No masses, redness, swelling, asymmetry, or associated skin lesions. No contractures.  Skin & Extremity Inspection: Skin color, temperature, and hair growth are WNL. No peripheral edema or cyanosis. No masses, redness, swelling, asymmetry, or associated skin lesions. No contractures.  Functional ROM: Unrestricted ROM          Functional ROM: Unrestricted ROM          Muscle Tone/Strength: Functionally intact. No obvious neuro-muscular anomalies detected.  Muscle Tone/Strength: Functionally intact. No obvious neuro-muscular anomalies detected.  Sensory (Neurological): Unimpaired  Sensory (Neurological): Unimpaired  Palpation: No palpable anomalies  Palpation: No palpable anomalies    Assessment:  1. Lumbar radiculopathy   2. Lumbar spondylosis   3. Chronic bilateral low back pain without sciatica   4. Lumbar degenerative disc disease   5. Facet arthropathy, lumbar ([place])      83 year old gentleman with a past medical history of hypertension, TIA (on Plavix), BPH, hypothyroidism who presents with axial low back pain with radiation to to bilateral buttocks and posterior thighs and intermittent radiation to right thigh above the knee and to right leg. No loss of bowel or bladder dysfunction. No weakness endorsed. Patient states that there are  days when he is pain-free and other days where he has severe throbbing dull pain in his low back. Patient was seen by Dr. Lacinda Axon, neurosurgery, and was not deemed a  surgical candidate but recommended the patient have a L2/L3 epidural steroid injection performed for right L3 radiculopathy.  Plan: -Lumbar (L2/L3) epidural steroid injection under fluoroscopy. Recent instructed that he must be off of his Plavix for 7 days prior to his scheduled procedure. Patient will discuss with primary care physician but does not think that it'll be a problem.  -If lumbar epidural steroid injections are not effective, patient does have signs and symptoms consistent with lumbar spondylosis (pain with facet loading, lumbar hyperextension, with radiation to bilateral buttocks and posterior thighs) and could benefit for diagnostic lumbar medial branch nerve blocks which we'll discuss in the future.  Future: -repeat ESI if effective -diagnostic b/l L3-L5 MBNB for lumbar spondylosis  Orders Placed This Encounter  Procedures  . Lumbar Epidural Injection    Standing Status:   Future    Standing Expiration Date:   04/01/2017    Scheduling Instructions:     Side: Midline     Sedation: No Sedation.     Timeframe: ASAP     At L2/L3, patient must be off Plavix for 7 days prior to procedure    Order Specific Question:   Where will this procedure be performed?    Answer:   [facility]

## 2017-03-02 NOTE — Patient Instructions (Signed)
We will get you scheduled for lumbar epidural steroid injection. Please stop your Plavix 7 days prior to your scheduled procedure. Please check with your primary care physician regarding stopping Plavix for this procedure.

## 2017-03-02 NOTE — Progress Notes (Signed)
Safety precautions to be maintained throughout the outpatient stay will include: orient to surroundings, keep bed in low position, maintain call bell within reach at all times, provide assistance with transfer out of bed and ambulation.  

## 2017-03-09 ENCOUNTER — Encounter: Payer: Self-pay | Admitting: Student in an Organized Health Care Education/Training Program

## 2017-03-09 ENCOUNTER — Ambulatory Visit: Payer: Medicare Other | Admitting: Student in an Organized Health Care Education/Training Program

## 2017-03-09 ENCOUNTER — Ambulatory Visit
Admission: RE | Admit: 2017-03-09 | Discharge: 2017-03-09 | Disposition: A | Discharge status: Home / Self Care | Payer: Medicare Other | Source: Ambulatory Visit | Attending: Student in an Organized Health Care Education/Training Program | Admitting: Student in an Organized Health Care Education/Training Program

## 2017-03-09 ENCOUNTER — Ambulatory Visit (HOSPITAL_BASED_OUTPATIENT_CLINIC_OR_DEPARTMENT_OTHER): Payer: Medicare Other | Admitting: Student in an Organized Health Care Education/Training Program

## 2017-03-09 VITALS — BP 114/88 | HR 75 | Temp 97.9°F | Resp 16 | Ht 71.0 in | Wt 210.0 lb

## 2017-03-09 DIAGNOSIS — M5136 Other intervertebral disc degeneration, lumbar region: Secondary | ICD-10-CM | POA: Diagnosis present

## 2017-03-09 DIAGNOSIS — M5416 Radiculopathy, lumbar region: Secondary | ICD-10-CM

## 2017-03-09 MED ORDER — ROPIVACAINE HCL 2 MG/ML IJ SOLN
2.0000 mL | Freq: Once | INTRAMUSCULAR | Status: AC
  Administered 2017-03-09: 10 mL via EPIDURAL

## 2017-03-09 MED ORDER — DEXAMETHASONE SODIUM PHOSPHATE 10 MG/ML IJ SOLN
10.0000 mg | Freq: Once | INTRAMUSCULAR | Status: AC
  Administered 2017-03-09: 10 mg

## 2017-03-09 MED ORDER — DEXAMETHASONE SODIUM PHOSPHATE 10 MG/ML IJ SOLN
INTRAMUSCULAR | Status: AC
  Filled 2017-03-09: qty 1

## 2017-03-09 MED ORDER — LIDOCAINE HCL (PF) 1 % IJ SOLN
INTRAMUSCULAR | Status: AC
  Filled 2017-03-09: qty 5

## 2017-03-09 MED ORDER — SODIUM CHLORIDE 0.9% FLUSH
2.0000 mL | Freq: Once | INTRAVENOUS | Status: AC
  Administered 2017-03-09: 10 mL

## 2017-03-09 MED ORDER — IOPAMIDOL (ISOVUE-M 200) INJECTION 41%
INTRAMUSCULAR | Status: AC
  Filled 2017-03-09: qty 10

## 2017-03-09 MED ORDER — ROPIVACAINE HCL 2 MG/ML IJ SOLN
INTRAMUSCULAR | Status: AC
  Filled 2017-03-09: qty 10

## 2017-03-09 MED ORDER — LIDOCAINE HCL (PF) 1 % IJ SOLN
10.0000 mL | Freq: Once | INTRAMUSCULAR | Status: AC
  Administered 2017-03-09: 5 mL

## 2017-03-09 MED ORDER — SODIUM CHLORIDE 0.9 % IJ SOLN
INTRAMUSCULAR | Status: AC
  Filled 2017-03-09: qty 10

## 2017-03-09 MED ORDER — IOPAMIDOL (ISOVUE-M 200) INJECTION 41%
10.0000 mL | Freq: Once | INTRAMUSCULAR | Status: AC
  Administered 2017-03-09: 10 mL via EPIDURAL

## 2017-03-09 NOTE — Progress Notes (Signed)
Safety precautions to be maintained throughout the outpatient stay will include: orient to surroundings, keep bed in low position, maintain call bell within reach at all times, provide assistance with transfer out of bed and ambulation.  

## 2017-03-09 NOTE — Progress Notes (Signed)
Patient's Name: Travis [name].  MRN: [id number]  Referring Provider: Marguarite Arbour, MD  DOB: 1929-10-28  PCP: Marguarite Arbour, MD  DOS: 03/09/2017  Note by: Edward Jolly, MD  Service setting: Ambulatory outpatient  Specialty: Interventional Pain Management  Patient type: Established  Location: [facility] (AMB) Pain Management Facility  Visit type: Interventional Procedure   Primary Reason for Visit: Interventional Pain Management Treatment. CC: Hip Pain (right hip)  Procedure:  Anesthesia, Analgesia, Anxiolysis:  Type: Therapeutic Inter-Laminar Epidural Steroid Injection Region: Lumbar Level: L2-3 Level. Laterality: Midline directed slightly towards right given R>L pain  Type: Local Anesthesia with Moderate (Conscious) Sedation Local Anesthetic: Lidocaine 1% Route: Intravenous (IV) IV Access: Secured Sedation: Meaningful verbal contact was maintained at all times during the procedure  Indication(s): Analgesia and Anxiety  Indications: 1. Lumbar radiculopathy   2. Lumbar degenerative disc disease    Pain Score: Pre-procedure: 3 /10 Post-procedure: 0-No pain/10  Pre-op Assessment:  Mr. Ballinas is a 83 y.o. (year old), male patient, seen today for interventional treatment. He  has a past surgical history that includes Back surgery; Appendectomy; Ganglion cyst excision; Circumcision; and Mohs surgery. Mr. Perrier has a current medication list which includes the following prescription(s): acetaminophen, benazepril-hydrochlorthiazide, celecoxib, clopidogrel, diltiazem, doxazosin, finasteride, fluticasone, levothyroxine, polyethylene glycol, potassium chloride sa, and ranitidine. His primarily concern today is the Hip Pain (right hip)  83 year old gentleman with a past medical history of hypertension, TIA (on Plavix), BPH, hypothyroidism who presents with axial low back pain with radiation to to bilateral buttocks and posterior thighs and intermittent radiation to right thigh above the knee  and to right leg. No loss of bowel or bladder dysfunction. No weakness endorsed. Patient states that there are days when he is pain-free and other days where he has severe throbbing dull pain in his low back. Patient was seen by Dr. Adriana Simas, neurosurgery, and was not deemed a surgical candidate but recommended the patient have a L2/L3 epidural steroid injection performed for right L3 radiculopathy.  Plan: -Lumbar (L2/L3) epidural steroid injection under fluoroscopy.  -Patient has been off of Plavix for the last 7 days, instructed to restart tomorrow, 24 hrs after procedure.   Initial Vital Signs: Blood pressure (!) 155/66, pulse 75, temperature 97.9 F (36.6 C), temperature source Oral, resp. rate 16, height  (1.803 m), weight 210 lb (95.3 kg), SpO2 98 %. BMI: Estimated body mass index is 29.29 kg/m as calculated from the following:   Height as of this encounter:  (1.803 m).   Weight as of this encounter: 210 lb (95.3 kg).  Risk Assessment: Allergies: Reviewed. He is allergic to morphine and related; phenol; and tetracyclines & related.  Allergy Precautions: None required Coagulopathies: Reviewed. None identified.  Blood-thinner therapy: None at this time Active Infection(s): Reviewed. None identified. Mr. Gwinner is afebrile  Site Confirmation: Mr. Coker was asked to confirm the procedure and laterality before marking the site Procedure checklist: Completed Consent: Before the procedure and under the influence of no sedative(s), amnesic(s), or anxiolytics, the patient was informed of the treatment options, risks and possible complications. To fulfill our ethical and legal obligations, as recommended by the American Medical Association's Code of Ethics, I have informed the patient of my clinical impression; the nature and purpose of the treatment or procedure; the risks, benefits, and possible complications of the intervention; the alternatives, including doing nothing; the risk(s)  and benefit(s) of the alternative treatment(s) or procedure(s); and the risk(s) and benefit(s) of doing nothing. The  patient was provided information about the general risks and possible complications associated with the procedure. These may include, but are not limited to: failure to achieve desired goals, infection, bleeding, organ or nerve damage, allergic reactions, paralysis, and death. In addition, the patient was informed of those risks and complications associated to Spine-related procedures, such as failure to decrease pain; infection (i.e.: Meningitis, epidural or intraspinal abscess); bleeding (i.e.: epidural hematoma, subarachnoid hemorrhage, or any other type of intraspinal or peri-dural bleeding); organ or nerve damage (i.e.: Any type of peripheral nerve, nerve root, or spinal cord injury) with subsequent damage to sensory, motor, and/or autonomic systems, resulting in permanent pain, numbness, and/or weakness of one or several areas of the body; allergic reactions; (i.e.: anaphylactic reaction); and/or death. Furthermore, the patient was informed of those risks and complications associated with the medications. These include, but are not limited to: allergic reactions (i.e.: anaphylactic or anaphylactoid reaction(s)); adrenal axis suppression; blood sugar elevation that in diabetics may result in ketoacidosis or comma; water retention that in patients with history of congestive heart failure may result in shortness of breath, pulmonary edema, and decompensation with resultant heart failure; weight gain; swelling or edema; medication-induced neural toxicity; particulate matter embolism and blood vessel occlusion with resultant organ, and/or nervous system infarction; and/or aseptic necrosis of one or more joints. Finally, the patient was informed that Medicine is not an exact science; therefore, there is also the possibility of unforeseen or unpredictable risks and/or possible complications that  may result in a catastrophic outcome. The patient indicated having understood very clearly. We have given the patient no guarantees and we have made no promises. Enough time was given to the patient to ask questions, all of which were answered to the patient's satisfaction. Mr. Mckeehan has indicated that he wanted to continue with the procedure. Attestation: I, the ordering provider, attest that I have discussed with the patient the benefits, risks, side-effects, alternatives, likelihood of achieving goals, and potential problems during recovery for the procedure that I have provided informed consent. Date: 03/09/2017; Time: 9:33 AM  Pre-Procedure Preparation:  Monitoring: As per clinic protocol. Respiration, ETCO2, SpO2, BP, heart rate and rhythm monitor placed and checked for adequate function Safety Precautions: Patient was assessed for positional comfort and pressure points before starting the procedure. Time-out: I initiated and conducted the "Time-out" before starting the procedure, as per protocol. The patient was asked to participate by confirming the accuracy of the "Time Out" information. Verification of the correct person, site, and procedure were performed and confirmed by me, the nursing staff, and the patient. "Time-out" conducted as per Joint Commission's Universal Protocol (UP.01.01.01). "Time-out" Date & Time: 03/09/2017; 1016 hrs.  Description of Procedure Process:   Position: Prone with head of the table was raised to facilitate breathing. Target Area: The interlaminar space, initially targeting the lower laminar border of the superior vertebral body. Approach: Paramedial approach. Area Prepped: Entire Posterior Lumbar Region Prepping solution: ChloraPrep (2% chlorhexidine gluconate and 70% isopropyl alcohol) Safety Precautions: Aspiration looking for blood return was conducted prior to all injections. At no point did we inject any substances, as a needle was being advanced. No  attempts were made at seeking any paresthesias. Safe injection practices and needle disposal techniques used. Medications properly checked for expiration dates. SDV (single dose vial) medications used. Description of the Procedure: Protocol guidelines were followed. The procedure needle was introduced through the skin, ipsilateral to the reported pain, and advanced to the target area. Bone was contacted and the needle  walked caudad, until the lamina was cleared. The epidural space was identified using "loss-of-resistance technique" with 2-3 ml of PF-NaCl (0.9% NSS), in a 5cc LOR glass syringe. Vitals:   03/09/17 1017 03/09/17 1022 03/09/17 1028 03/09/17 1033  BP: (!) 144/84 127/85 140/74 114/88  Pulse:      Resp: 20 20 (!) 22 16  Temp:      TempSrc:      SpO2: 96% 94% 95% 94%  Weight:      Height:        Start Time: 1021 hrs. End Time: 1032 hrs. Materials:  Needle(s) Type: Epidural needle Gauge: 17G Length: 3.5-in Medication(s): We administered iopamidol, ropivacaine (PF) 2 mg/mL (0.2%), sodium chloride flush, dexamethasone, and lidocaine (PF). Please see chart orders for dosing details. 5.5 cc normal saline, 1.5 cc 0.2% ropivacaine, 1 cc Decadron: 8 cc total  Imaging Guidance (Spinal):  Type of Imaging Technique: Fluoroscopy Guidance (Spinal) Indication(s): Assistance in needle guidance and placement for procedures requiring needle placement in or near specific anatomical locations not easily accessible without such assistance. Exposure Time: Please see nurses notes. Contrast: Before injecting any contrast, we confirmed that the patient did not have an allergy to iodine, shellfish, or radiological contrast. Once satisfactory needle placement was completed at the desired level, radiological contrast was injected. Contrast injected under live fluoroscopy. No contrast complications. See chart for type and volume of contrast used. Fluoroscopic Guidance: I was personally present during the use  of fluoroscopy. "Tunnel Vision Technique" used to obtain the best possible view of the target area. Parallax error corrected before commencing the procedure. "Direction-depth-direction" technique used to introduce the needle under continuous pulsed fluoroscopy. Once target was reached, antero-posterior, oblique, and lateral fluoroscopic projection used confirm needle placement in all planes. Images permanently stored in EMR. Interpretation: I personally interpreted the imaging intraoperatively. Adequate needle placement confirmed in multiple planes. Appropriate spread of contrast into desired area was observed. No evidence of afferent or efferent intravascular uptake. No intrathecal or subarachnoid spread observed. Permanent images saved into the patient's record.  Antibiotic Prophylaxis:  Indication(s): None identified Antibiotic given: None  Post-operative Assessment:  EBL: None Complications: No immediate post-treatment complications observed by team, or reported by patient. Note: The patient tolerated the entire procedure well. A repeat set of vitals were taken after the procedure and the patient was kept under observation following institutional policy, for this type of procedure. Post-procedural neurological assessment was performed, showing return to baseline, prior to discharge. The patient was provided with post-procedure discharge instructions, including a section on how to identify potential problems. Should any problems arise concerning this procedure, the patient was given instructions to immediately contact us, at any time, without hesitation. In any case, we plan to contact the patient by telephone for a follow-up status report regarding this interventional procedure. Comments:  No additional relevant information.  Plan of Care  Disposition: Discharge home  Discharge Date & Time: 03/09/2017; 1034 hrs.  5 out of 5 strength bilateral lower extremity on knee flexion, knee extension,  plantar flexion, dorsiflexion bilaterally Patient instructed to start Plavix tomorrow, 24 hours after procedure.  Physician-requested Follow-up:  Return in about 2 weeks (around 03/23/2017) for Post Procedure Evaluation.  Future Appointments Date Time Provider Department Center  03/22/2017 12:15 PM Edward Jolly, MD [facility] None    Imaging Orders     DG C-Arm 1-60 Min-No Report Procedure Orders    No procedure(s) ordered today    Medications ordered for procedure: Meds ordered this encounter  Medications  . iopamidol (ISOVUE-M) 41 % intrathecal injection 10 mL  . ropivacaine (PF) 2 mg/mL (0.2%) (NAROPIN) injection 2 mL  . sodium chloride flush (NS) 0.9 % injection 2 mL  . dexamethasone (DECADRON) injection 10 mg  . lidocaine (PF) (XYLOCAINE) 1 % injection 10 mL   Medications administered: We administered iopamidol, ropivacaine (PF) 2 mg/mL (0.2%), sodium chloride flush, dexamethasone, and lidocaine (PF).  See the medical record for exact dosing, route, and time of administration.  New Prescriptions   No medications on file   Primary Care Physician: Marguarite ArbourSparks, Jeffrey D, MD [facility] Note by: Edward JollyBilal Brinly Maietta, MD Date: 03/09/2017; Time: 1:48 PM  Disclaimer:  Medicine is not an exact science. The only guarantee in medicine is that nothing is guaranteed. It is important to note that the decision to proceed with this intervention was based on the information collected from the patient. The Data and conclusions were drawn from the patient's questionnaire, the interview, and the physical examination. Because the information was provided in large part by the patient, it cannot be guaranteed that it has not been purposely or unconsciously manipulated. Every effort has been made to obtain as much relevant data as possible for this evaluation. It is important to note that the conclusions that lead to this procedure are derived in large part from the  available data. Always take into account that the treatment will also be dependent on availability of resources and existing treatment guidelines, considered by other Pain Management Practitioners as being common knowledge and practice, at the time of the intervention. For Medico-Legal purposes, it is also important to point out that variation in procedural techniques and pharmacological choices are the acceptable norm. The indications, contraindications, technique, and results of the above procedure should only be interpreted and judged by a Board-Certified Interventional Pain Specialist with extensive familiarity and expertise in the same exact procedure and technique.

## 2017-03-09 NOTE — Patient Instructions (Signed)
Preparing for your procedure (without sedation) Instructions: . Oral Intake: Do not eat or drink anything for at least 3 hours prior to your procedure. . Transportation: Unless otherwise stated by your physician, you may drive yourself after the procedure. . Blood Pressure Medicine: Take your blood pressure medicine with a sip of water the morning of the procedure. . Insulin: Take only  of your normal insulin dose. . Preventing infections: Shower with an antibacterial soap the morning of your procedure. . Build-up your immune system: Take 1000 mg of Vitamin C with every meal (3 times a day) the day prior to your procedure. . Pregnancy: If you are pregnant, call and cancel the procedure. . Sickness: If you have a cold, fever, or any active infections, call and cancel the procedure. . Arrival: You must be in the facility at least 30 minutes prior to your scheduled procedure. . Children: Do not bring any children with you. . Dress appropriately: Bring dark clothing that you would not mind if they get stained. . Valuables: Do not bring any jewelry or valuables. Procedure appointments are reserved for interventional treatments only. Marland Kitchen No Prescription Refills. . No medication changes will be discussed during procedure appointments. No disability issues will be discussed.Post-procedure Information What to expect: Most procedures involve the use of a local anesthetic (numbing medicine), and a steroid (anti-inflammatory medicine).  The local anesthetics may cause temporary numbness and weakness of the legs or arms, depending on the location of the block. This numbness/weakness may last 4-6 hours, depending on the local anesthetic used. In rare instances, it can last up to 24 hours. While numb, you must be very careful not to injure the extremity.  After any procedure, you could expect the pain to get better within 15-20 minutes. This relief is temporary and may last 4-6 hours. Once the local  anesthetics wears off, you could experience discomfort, possibly more than usual, for up to 10 (ten) days. In the case of radiofrequencies, it may last up to 6 weeks. Surgeries may take up to 8 weeks for the healing process. The discomfort is due to the irritation caused by needles going through skin and muscle. To minimize the discomfort, we recommend using ice the first day, and heat from then on. The ice should be applied for 15 minutes on, and 15 minutes off. Keep repeating this cycle until bedtime. Avoid applying the ice directly to the skin, to prevent frostbite. Heat should be used daily, until the pain improves (4-10 days). Be careful not to burn yourself.  Occasionally you may experience muscle spasms or cramps. These occur as a consequence of the irritation caused by the needle sticks to the muscle and the blood that will inevitably be lost into the surrounding muscle tissue. Blood tends to be very irritating to tissues, which tend to react by going into spasm. These spasms may start the same day of your procedure, but they may also take days to develop. This late onset type of spasm or cramp is usually caused by electrolyte imbalances triggered by the steroids, at the level of the kidney. Cramps and spasms tend to respond well to muscle relaxants, multivitamins (some are triggered by the procedure, but may have their origins in vitamin deficiencies), and "Gatorade", or any sports drinks that can replenish any electrolyte imbalances. (If you are a diabetic, ask your pharmacist to get you a sugar-free brand.) Warm showers or baths may also be helpful. Stretching exercises are highly recommended. General Instructions:  Be alert for  signs of possible infection: redness, swelling, heat, red streaks, elevated temperature, and/or fever. These typically appear 4 to 6 days after the procedure. Immediately notify your doctor if you experience unusual bleeding, difficulty breathing, or loss of bowel or bladder  control. If you experience increased pain, do not increase your pain medicine intake, unless instructed by your pain physician. Post-Procedure Care:  Be careful in moving about. Muscle spasms in the area of the injection may occur. Applying ice or heat to the area is often helpful. The incidence of spinal headaches after epidural injections ranges between 1.4% and 6%. If you develop a headache that does not seem to respond to conservative therapy, please let your physician know. This can be treated with an epidural blood patch.   Post-procedure numbness or redness is to be expected, however it should average 4 to 6 hours. If numbness and weakness of your extremities begins to develop 4 to 6 hours after your procedure, and is felt to be progressing and worsening, immediately contact your physician.   Diet:  If you experience nausea, do not eat until this sensation goes away. If you had a "Stellate Ganglion Block" for upper extremity "Reflex Sympathetic Dystrophy", do not eat or drink until your hoarseness goes away. In any case, always start with liquids first and if you tolerate them well, then slowly progress to more solid foods. Activity:  For the first 4 to 6 hours after the procedure, use caution in moving about as you may experience numbness and/or weakness. Use caution in cooking, using household electrical appliances, and climbing steps. If you need to reach your Doctor call our office: [phone]) [phone] Monday-Thursday 8:00 am - 4:00 PM    Fridays: Closed     In case of an emergency: In case of emergency, call 911 or go to the nearest emergency room and have the physician there call us.  Interpretation of Procedure Every nerve block has two components: a diagnostic component, and a treatment component. Unrealistic expectations are the most common causes of "perceived failure".  In a perfect world, a single nerve block should be able to completely and permanently eliminate the pain. Sadly, the  world is not perfect.  Most pain management nerve blocks are performed using local anesthetics and steroids. Steroids are responsible for any long-term benefit that you may experience. Their purpose is to decrease any chronic swelling that may exist in the area. Steroids begin to work immediately after being injected. However, most patients will not experience any benefits until 5 to 10 days after the injection, when the swelling has come down to the point where they can tell a difference. Steroids will only help if there is swelling to be treated. As such, they can assist with the diagnosis. If effective, they suggest an inflammatory component to the pain, and if ineffective, they rule out inflammation as the main cause or component of the problem. If the problem is one of mechanical compression, you will get no benefit from those steroids.   In the case of local anesthetics, they have a crucial role in the diagnosis of your condition. Most will begin to work within15 to 20 minutes after injection. The duration will depend on the type used (short- vs. Long-acting). It is of outmost importance that patients keep tract of their pain, after the procedure. To assist with this matter, a "Post-procedure Pain Diary" is provided. Make sure to complete it and to bring it back to your follow-up appointment.  As long as the  patient keeps accurate, detailed records of their symptoms after every procedure, and returns to have those interpreted, every procedure will provide us with invaluable information. Even a block that does not provide the patient with any relief, will always provide us with information about the mechanism and the origin of the pain. The only time a nerve block can be considered a waste of time is when patients do not keep track of the results, or do not keep their post-procedure appointment.  Reporting the results back to your physician The Pain Score  Pain is a subjective complaint. It cannot be  seen, touched, or measured. We depend entirely on the patient's report of the pain in order to assess your condition and treatment. To evaluate the pain, we use a pain scale, where "0" means "No Pain", and a "10" is "the worst possible pain that you can even imagine" (i.e. something like been eaten alive by a shark or being torn apart by a lion).   You will frequently be asked to rate your pain. Please be as accurate, remember that medical decisions will be based on your responses. Please do not rate your pain above a 10. Doing so is actually interpreted as "symptom magnification" (exaggeration), as well as lack of understanding with regards to the scale. To put this into perspective, when you tell us that your pain is at a 10 (ten), what you are saying is that there is nothing we can do to make this pain any worse. (Carefully think about that.)Epidural Steroid Injection Patient Information  Description: The epidural space surrounds the nerves as they exit the spinal cord.  In some patients, the nerves can be compressed and inflamed by a bulging disc or a tight spinal canal (spinal stenosis).  By injecting steroids into the epidural space, we can bring irritated nerves into direct contact with a potentially helpful medication.  These steroids act directly on the irritated nerves and can reduce swelling and inflammation which often leads to decreased pain.  Epidural steroids may be injected anywhere along the spine and from the neck to the low back depending upon the location of your pain.   After numbing the skin with local anesthetic (like Novocaine), a small needle is passed into the epidural space slowly.  You may experience a sensation of pressure while this is being done.  The entire block usually last less than 10 minutes.  Conditions which may be treated by epidural steroids:   Low back and leg pain  Neck and arm pain  Spinal stenosis  Post-laminectomy syndrome  Herpes zoster (shingles)  pain  Pain from compression fractures  Preparation for the injection:  1. Do not eat any solid food or dairy products within 8 hours of your appointment.  2. You may drink clear liquids up to 3 hours before appointment.  Clear liquids include water, black coffee, juice or soda.  No milk or cream please. 3. You may take your regular medication, including pain medications, with a sip of water before your appointment  Diabetics should hold regular insulin (if taken separately) and take 1/2 normal NPH dos the morning of the procedure.  Carry some sugar containing items with you to your appointment. 4. A driver must accompany you and be prepared to drive you home after your procedure.  5. Bring all your current medications with your. 6. An IV may be inserted and sedation may be given at the discretion of the physician.   7. A blood pressure cuff, EKG  and other monitors will often be applied during the procedure.  Some patients may need to have extra oxygen administered for a short period. 8. You will be asked to provide medical information, including your allergies, prior to the procedure.  We must know immediately if you are taking blood thinners (like Coumadin/Warfarin)  Or if you are allergic to IV iodine contrast (dye). We must know if you could possible be pregnant.  Possible side-effects:  Bleeding from needle site  Infection (rare, may require surgery)  Nerve injury (rare)  Numbness & tingling (temporary)  Difficulty urinating (rare, temporary)  Spinal headache ( a headache worse with upright posture)  Light -headedness (temporary)  Pain at injection site (several days)  Decreased blood pressure (temporary)  Weakness in arm/leg (temporary)  Pressure sensation in back/neck (temporary)  Call if you experience:  Fever/chills associated with headache or increased back/neck pain.  Headache worsened by an upright position.  New onset weakness or numbness of an extremity below  the injection site  Hives or difficulty breathing (go to the emergency room)  Inflammation or drainage at the infection site  Severe back/neck pain  Any new symptoms which are concerning to you  Please note:  Although the local anesthetic injected can often make your back or neck feel good for several hours after the injection, the pain will likely return.  It takes 3-7 days for steroids to work in the epidural space.  You may not notice any pain relief for at least that one week.  If effective, we will often do a series of three injections spaced 3-6 weeks apart to maximally decrease your pain.  After the initial series, we generally will wait several months before considering a repeat injection of the same type.  If you have any questions, please call [phone] [facility]

## 2017-03-10 ENCOUNTER — Telehealth: Payer: Self-pay | Admitting: *Deleted

## 2017-03-10 NOTE — Telephone Encounter (Signed)
Spoke with patient's son-in-law, reports patient is well this mornnig.

## 2017-03-22 ENCOUNTER — Ambulatory Visit
Payer: Medicare Other | Attending: Student in an Organized Health Care Education/Training Program | Admitting: Student in an Organized Health Care Education/Training Program

## 2017-03-22 VITALS — BP 163/66 | HR 73 | Temp 98.1°F | Resp 16 | Ht 71.0 in | Wt 210.0 lb

## 2017-03-22 DIAGNOSIS — I6529 Occlusion and stenosis of unspecified carotid artery: Secondary | ICD-10-CM | POA: Insufficient documentation

## 2017-03-22 DIAGNOSIS — Z86010 Personal history of colonic polyps: Secondary | ICD-10-CM | POA: Diagnosis not present

## 2017-03-22 DIAGNOSIS — E039 Hypothyroidism, unspecified: Secondary | ICD-10-CM | POA: Insufficient documentation

## 2017-03-22 DIAGNOSIS — N189 Chronic kidney disease, unspecified: Secondary | ICD-10-CM | POA: Insufficient documentation

## 2017-03-22 DIAGNOSIS — M4696 Unspecified inflammatory spondylopathy, lumbar region: Secondary | ICD-10-CM | POA: Insufficient documentation

## 2017-03-22 DIAGNOSIS — M545 Low back pain: Secondary | ICD-10-CM | POA: Insufficient documentation

## 2017-03-22 DIAGNOSIS — G8929 Other chronic pain: Secondary | ICD-10-CM | POA: Diagnosis present

## 2017-03-22 DIAGNOSIS — Z9889 Other specified postprocedural states: Secondary | ICD-10-CM | POA: Diagnosis not present

## 2017-03-22 DIAGNOSIS — Z87891 Personal history of nicotine dependence: Secondary | ICD-10-CM | POA: Diagnosis not present

## 2017-03-22 DIAGNOSIS — M5136 Other intervertebral disc degeneration, lumbar region: Secondary | ICD-10-CM | POA: Diagnosis not present

## 2017-03-22 DIAGNOSIS — Z801 Family history of malignant neoplasm of trachea, bronchus and lung: Secondary | ICD-10-CM | POA: Diagnosis not present

## 2017-03-22 DIAGNOSIS — Z885 Allergy status to narcotic agent status: Secondary | ICD-10-CM | POA: Insufficient documentation

## 2017-03-22 DIAGNOSIS — M5416 Radiculopathy, lumbar region: Secondary | ICD-10-CM | POA: Diagnosis not present

## 2017-03-22 DIAGNOSIS — I129 Hypertensive chronic kidney disease with stage 1 through stage 4 chronic kidney disease, or unspecified chronic kidney disease: Secondary | ICD-10-CM | POA: Diagnosis not present

## 2017-03-22 DIAGNOSIS — M1990 Unspecified osteoarthritis, unspecified site: Secondary | ICD-10-CM | POA: Insufficient documentation

## 2017-03-22 DIAGNOSIS — Z888 Allergy status to other drugs, medicaments and biological substances status: Secondary | ICD-10-CM | POA: Diagnosis not present

## 2017-03-22 DIAGNOSIS — Z8249 Family history of ischemic heart disease and other diseases of the circulatory system: Secondary | ICD-10-CM | POA: Insufficient documentation

## 2017-03-22 DIAGNOSIS — E785 Hyperlipidemia, unspecified: Secondary | ICD-10-CM | POA: Insufficient documentation

## 2017-03-22 DIAGNOSIS — Z881 Allergy status to other antibiotic agents status: Secondary | ICD-10-CM | POA: Diagnosis not present

## 2017-03-22 DIAGNOSIS — M47816 Spondylosis without myelopathy or radiculopathy, lumbar region: Secondary | ICD-10-CM | POA: Diagnosis not present

## 2017-03-22 DIAGNOSIS — I429 Cardiomyopathy, unspecified: Secondary | ICD-10-CM | POA: Insufficient documentation

## 2017-03-22 DIAGNOSIS — Z8673 Personal history of transient ischemic attack (TIA), and cerebral infarction without residual deficits: Secondary | ICD-10-CM | POA: Insufficient documentation

## 2017-03-22 DIAGNOSIS — Z818 Family history of other mental and behavioral disorders: Secondary | ICD-10-CM | POA: Insufficient documentation

## 2017-03-22 NOTE — Progress Notes (Signed)
Patient's Name: Travis [name].  MRN: [id number]  Referring Provider: Idelle Crouch, MD  DOB: 29-Sep-1929  PCP: Idelle Crouch, MD  DOS: 03/22/2017  Note by: Gillis Santa, MD  Service setting: Ambulatory outpatient  Specialty: Interventional Pain Management  Location: [facility] (AMB) Pain Management Facility    Patient type: Established   Primary Reason(s) for Visit: Encounter for post-procedure evaluation of chronic illness with mild to moderate exacerbation CC: Back Pain (lower)  HPI  Travis [name] is a 83 y.o. year old, male patient, who comes today for a post-procedure evaluation. He has Hypertension; PVC (premature ventricular contraction); Lumbar radiculopathy; Lumbar degenerative disc disease; Lumbar spondylosis; Chronic bilateral low back pain without sciatica; and Facet arthropathy, lumbar (HCC) on his problem list. His primarily concern today is the Back Pain (lower)  Pain Assessment: Location: Lower Back Radiating: none Onset: More than a month ago Duration: Chronic pain Quality: Aching Severity: 0-No pain/10 (self-reported pain score)  Note: Reported level is compatible with observation.                   When using our objective Pain Scale, any level above a 5/10 is said to belong in an emergency room, as it progressively worsens from a 6/10, which is described as severely limiting. Requiring emergency care not usually available at an outpatient pain management facility. Communication becomes difficult and requires great effort. Assistance to reach the emergency department may be required. Facial flushing and profuse sweating along with potentially dangerous increases in heart rate and blood pressure will be evident. Effect on ADL: picking up heavy items Timing: Intermittent Modifying factors: resting, sitting,procedure  Travis [name] comes in today for post-procedure evaluation after the treatment done on 03/09/2017.  Further details on both, my assessment(s), as well as the  proposed treatment plan, please see below.  Post-Procedure Assessment  03/09/2017 Procedure: Type: Therapeutic Inter-Laminar Lumbar Epidural Steroid Injection L2-3 Level.             Laterality: Midline directed slightly towards right given R>L pain Pre-procedure pain score:  3/10 Post-procedure pain score: 0/10 (100% relief) Influential Factors: BMI: 29.29 kg/m Intra-procedural challenges: None observed.         Assessment challenges: None detected.              Reported side-effects: None.        Post-procedural adverse reactions or complications: None reported         Sedation: Please see nurses note. When no sedatives are used, the analgesic levels obtained are directly associated to the effectiveness of the local anesthetics. However, when sedation is provided, the level of analgesia obtained during the initial 1 hour following the intervention, is believed to be the result of a combination of factors. These factors may include, but are not limited to: 1. The effectiveness of the local anesthetics used. 2. The effects of the analgesic(s) and/or anxiolytic(s) used. 3. The degree of discomfort experienced by the patient at the time of the procedure. 4. The patients ability and reliability in recalling and recording the events. 5. The presence and influence of possible secondary gains and/or psychosocial factors. Reported result: Relief experienced during the 1st hour after the procedure: 100 % (Ultra-Short Term Relief)            Interpretative annotation: Clinically appropriate result. Analgesia during this period is likely to be Local Anesthetic and/or IV Sedative (Analgesic/Anxiolytic) related.          Effects of local anesthetic: The  analgesic effects attained during this period are directly associated to the localized infiltration of local anesthetics and therefore cary significant diagnostic value as to the etiological location, or anatomical origin, of the pain. Expected duration of  relief is directly dependent on the pharmacodynamics of the local anesthetic used. Long-acting (4-6 hours) anesthetics used.  Reported result: Relief during the next 4 to 6 hour after the procedure: 100 % (Short-Term Relief)            Interpretative annotation: Clinically appropriate result. Analgesia during this period is likely to be Local Anesthetic-related.          Long-term benefit: Defined as the period of time past the expected duration of local anesthetics (1 hour for short-acting and 4-6 hours for long-acting). With the possible exception of prolonged sympathetic blockade from the local anesthetics, benefits during this period are typically attributed to, or associated with, other factors such as analgesic sensory neuropraxia, antiinflammatory effects, or beneficial biochemical changes provided by agents other than the local anesthetics.  Reported result: Extended relief following procedure: 90 % (Long-Term Relief)            Interpretative annotation: Clinically appropriate result. Good relief. Therapeutic success. Inflammation plays a part in the etiology to the pain.          Current benefits: Defined as reported results that persistent at this point in time.   Analgesia: 75-100 %            Function: Travis [name] reports improvement in function ROM: Travis [name] reports improvement in ROM Interpretative annotation: Clinically significant results. Therapeutic benefit observed. Effective therapeutic approach.          Interpretation: Results would suggest a successful therapeutic intervention.                  Plan:  Please see "Plan of Care" for details.       Laboratory Chemistry  Inflammation Markers (CRP: Acute Phase) (ESR: Chronic Phase) No results found for: CRP, ESRSEDRATE               Renal Function Markers Lab Results  Component Value Date   BUN 25 (H) 08/20/2016   CREATININE 1.45 (H) 08/20/2016   GFRAA 49 (L) 08/20/2016   GFRNONAA 43 (L) 08/20/2016                  Hepatic Function Markers No results found for: AST, ALT, ALBUMIN, ALKPHOS, HCVAB               Electrolytes Lab Results  Component Value Date   NA 138 08/20/2016   K 3.9 08/20/2016   CL 106 08/20/2016   CALCIUM 8.7 (L) 08/20/2016                 Neuropathy Markers No results found for: [id number]               Bone Pathology Markers Lab Results  Component Value Date   CALCIUM 8.7 (L) 08/20/2016                 Coagulation Parameters Lab Results  Component Value Date   PLT 126 (L) 08/20/2016                 Cardiovascular Markers Lab Results  Component Value Date   HGB 13.7 08/20/2016   HCT 39.0 (L) 08/20/2016                 Note: Lab results reviewed.  Recent Diagnostic Imaging Results  DG C-Arm 1-60 Min-No Report Fluoroscopy was utilized by the requesting physician.  No radiographic  interpretation.   Note: Imaging results reviewed.        Meds   Current Outpatient Prescriptions:  .  acetaminophen (TYLENOL) 500 MG tablet, Take 500 mg by mouth daily as needed., Disp: , Rfl:  .  benazepril-hydrochlorthiazide (LOTENSIN HCT) 20-12.5 MG per tablet, Take 1 tablet by mouth daily., Disp: , Rfl:  .  celecoxib (CELEBREX) 200 MG capsule, Take 200 mg by mouth daily., Disp: , Rfl:  .  clopidogrel (PLAVIX) 75 MG tablet, Take 75 mg by mouth daily., Disp: , Rfl:  .  diltiazem (CARDIZEM CD) 120 MG 24 hr capsule, Take 1 capsule (120 mg total) by mouth daily., Disp: 90 capsule, Rfl: 3 .  doxazosin (CARDURA) 4 MG tablet, Take 4 mg by mouth daily., Disp: , Rfl:  .  finasteride (PROSCAR) 5 MG tablet, Take 5 mg by mouth daily., Disp: , Rfl:  .  fluticasone (FLONASE) 50 MCG/ACT nasal spray, Place 1 spray into both nostrils as needed for allergies or rhinitis., Disp: , Rfl:  .  levothyroxine (SYNTHROID, LEVOTHROID) 50 MCG tablet, Take 50 mcg by mouth daily before breakfast., Disp: , Rfl:  .  polyethylene glycol (MIRALAX / GLYCOLAX) packet, Take 17 g by mouth daily., Disp: , Rfl:   .  potassium chloride SA (K-DUR,KLOR-CON) 20 MEQ tablet, Take 20 mEq by mouth 2 (two) times daily., Disp: , Rfl:  .  ranitidine (ZANTAC) 75 MG tablet, Take 75 mg by mouth as needed for heartburn., Disp: , Rfl:   ROS  Constitutional: Denies any fever or chills Gastrointestinal: No reported hemesis, hematochezia, vomiting, or acute GI distress Musculoskeletal: Denies any acute onset joint swelling, redness, loss of ROM, or weakness Neurological: No reported episodes of acute onset apraxia, aphasia, dysarthria, agnosia, amnesia, paralysis, loss of coordination, or loss of consciousness  Allergies  Travis [name] is allergic to morphine and related; phenol; and tetracyclines & related.  [place]  Drug: Travis [name]  reports that he does not use drugs. Alcohol:  reports that he drinks alcohol. Tobacco:  reports that he has quit smoking. His smoking use included Cigarettes. He quit after 4.00 years of use. He has never used smokeless tobacco. Medical:  has a past medical history of Allergic rhinitis; Anemia, unspecified; Cardiac dysrhythmia, unspecified; Carotid artery occlusion; Chronic kidney disease; Colonic polyp; Dizziness and giddiness; Heel spur; Hyperlipidemia; Hypertension; Hypothyroidism; OA (osteoarthritis); Renal insufficiency; Stroke ([place]) (07/2016); and Tinea cruris. Surgical: Travis [name]  has a past surgical history that includes Back surgery; Appendectomy; Ganglion cyst excision; Circumcision; and Mohs surgery. Family: family history includes Dementia in his mother; Hypertension in his mother; Lung cancer in his sister; Myasthenia gravis in his father.  Constitutional Exam  General appearance: Well nourished, well developed, and well hydrated. In no apparent acute distress Vitals:   03/22/17 1223  BP: (!) 163/66  Pulse: 73  Resp: 16  Temp: 98.1 F (36.7 C)  SpO2: 98%  Weight: 210 lb (95.3 kg)  Height: 5' 11"  (1.803 m)   BMI Assessment: Estimated body mass index is 29.29 kg/m as  calculated from the following:   Height as of this encounter: 5' 11"  (1.803 m).   Weight as of this encounter: 210 lb (95.3 kg).  BMI interpretation table: BMI level Category Range association with higher incidence of chronic pain  <18 kg/m2 Underweight   18.5-24.9 kg/m2 Ideal body weight   25-29.9 kg/m2 Overweight  Increased incidence by 20%  30-34.9 kg/m2 Obese (Class I) Increased incidence by 68%  35-39.9 kg/m2 Severe obesity (Class II) Increased incidence by 136%  >40 kg/m2 Extreme obesity (Class III) Increased incidence by 254%   BMI Readings from Last 4 Encounters:  03/22/17 29.29 kg/m  03/09/17 29.29 kg/m  03/02/17 29.15 kg/m  02/24/17 29.22 kg/m   Wt Readings from Last 4 Encounters:  03/22/17 210 lb (95.3 kg)  03/09/17 210 lb (95.3 kg)  03/02/17 209 lb (94.8 kg)  02/24/17 209 lb 8 oz (95 kg)  Psych/Mental status: Alert, oriented x 3 (person, place, & time)       Eyes: PERLA Respiratory: No evidence of acute respiratory distress  Cervical Spine Area Exam  Skin & Axial Inspection: No masses, redness, edema, swelling, or associated skin lesions Alignment: Symmetrical Functional ROM: Unrestricted ROM      Stability: No instability detected Muscle Tone/Strength: Functionally intact. No obvious neuro-muscular anomalies detected. Sensory (Neurological): Unimpaired Palpation: No palpable anomalies              Upper Extremity (UE) Exam    Side: Right upper extremity  Side: Left upper extremity  Skin & Extremity Inspection: Skin color, temperature, and hair growth are WNL. No peripheral edema or cyanosis. No masses, redness, swelling, asymmetry, or associated skin lesions. No contractures.  Skin & Extremity Inspection: Skin color, temperature, and hair growth are WNL. No peripheral edema or cyanosis. No masses, redness, swelling, asymmetry, or associated skin lesions. No contractures.  Functional ROM: Unrestricted ROM          Functional ROM: Unrestricted ROM           Muscle Tone/Strength: Functionally intact. No obvious neuro-muscular anomalies detected.  Muscle Tone/Strength: Functionally intact. No obvious neuro-muscular anomalies detected.  Sensory (Neurological): Unimpaired          Sensory (Neurological): Unimpaired          Palpation: No palpable anomalies              Palpation: No palpable anomalies              Specialized Test(s): Deferred         Specialized Test(s): Deferred          Thoracic Spine Area Exam  Skin & Axial Inspection: No masses, redness, or swelling Alignment: Symmetrical Functional ROM: Unrestricted ROM Stability: No instability detected Muscle Tone/Strength: Functionally intact. No obvious neuro-muscular anomalies detected. Sensory (Neurological): Unimpaired Muscle strength & Tone: No palpable anomalies  Lumbar Spine Area Exam  Skin & Axial Inspection: No masses, redness, or swelling Alignment: Symmetrical Functional ROM: Unrestricted ROM      Stability: No instability detected Muscle Tone/Strength: Functionally intact. No obvious neuro-muscular anomalies detected. Sensory (Neurological): Unimpaired Palpation: No palpable anomalies       Provocative Tests: Lumbar Hyperextension and rotation test: Improved after treatment       Lumbar Lateral bending test: Improved after treatment       Patrick's Maneuver: evaluation deferred today                    Gait & Posture Assessment  Ambulation: Unassisted Gait: Relatively normal for age and body habitus Posture: WNL   Lower Extremity Exam    Side: Right lower extremity  Side: Left lower extremity  Skin & Extremity Inspection: Skin color, temperature, and hair growth are WNL. No peripheral edema or cyanosis. No masses, redness, swelling, asymmetry, or associated skin lesions. No contractures.  Skin &  Extremity Inspection: Skin color, temperature, and hair growth are WNL. No peripheral edema or cyanosis. No masses, redness, swelling, asymmetry, or associated skin lesions.  No contractures.  Functional ROM: Unrestricted ROM          Functional ROM: Unrestricted ROM          Muscle Tone/Strength: Functionally intact. No obvious neuro-muscular anomalies detected.  Muscle Tone/Strength: Functionally intact. No obvious neuro-muscular anomalies detected.  Sensory (Neurological): Unimpaired  Sensory (Neurological): Unimpaired  Palpation: No palpable anomalies  Palpation: No palpable anomalies   Assessment  Primary Diagnosis & Pertinent Problem List: The primary encounter diagnosis was Lumbar radiculopathy. Diagnoses of Lumbar degenerative disc disease, Lumbar spondylosis, Chronic bilateral low back pain without sciatica, and Facet arthropathy, lumbar ([place]) were also pertinent to this visit.  Status Diagnosis  Improving Stable Stable 1. Lumbar radiculopathy   2. Lumbar degenerative disc disease   3. Lumbar spondylosis   4. Chronic bilateral low back pain without sciatica   5. Facet arthropathy, lumbar ([place])      83 year old gentleman with a past medical history of hypertension, TIA (on Plavix), BPH, hypothyroidism  with axial low back pain with radiation to to bilateral buttocks and posterior thighs and intermittent radiation to right thigh above the knee and to right leg status post L2-L3 interlaminar epidural steroid injection on 03/09/2017 with greater than 75% relief of pain symptoms. Patient notes that his back pain is much improved and he is able to ambulate for a longer period of time with less pain. He is able to perform activities of daily living without as much pain. He is very pleased with the results.  Plan: -Successful lumbar epidural steroid injection with almost 100% improvement in axial low back and radicular pain. -Follow up when necessary, consider repeat lumbar epidural steroid injection versus lumbar medial branch facet blocks if return of pain.  Future:  Considering:   -repeat L2-L3 directed towards the right epidural steroid injection -right  versus bilateral lumbar facet medial branch nerve blocks    Provider-requested follow-up: Return if symptoms worsen or fail to improve.  No future appointments.  Primary Care Physician: Idelle Crouch, MD Location: [facility] Note by: Gillis Santa, M.D Date: 03/22/2017; Time: 1:59 PM  There are no Patient Instructions on file for this visit.

## 2017-03-22 NOTE — Progress Notes (Signed)
Safety precautions to be maintained throughout the outpatient stay will include: orient to surroundings, keep bed in low position, maintain call bell within reach at all times, provide assistance with transfer out of bed and ambulation.  

## 2017-08-31 ENCOUNTER — Encounter: Payer: Self-pay | Admitting: Emergency Medicine

## 2017-08-31 ENCOUNTER — Emergency Department: Payer: Medicare Other

## 2017-08-31 ENCOUNTER — Emergency Department
Admission: EM | Admit: 2017-08-31 | Discharge: 2017-08-31 | Disposition: A | Discharge status: Home / Self Care | Payer: Medicare Other | Attending: Emergency Medicine | Admitting: Emergency Medicine

## 2017-08-31 ENCOUNTER — Other Ambulatory Visit: Payer: Self-pay

## 2017-08-31 DIAGNOSIS — E039 Hypothyroidism, unspecified: Secondary | ICD-10-CM | POA: Insufficient documentation

## 2017-08-31 DIAGNOSIS — I129 Hypertensive chronic kidney disease with stage 1 through stage 4 chronic kidney disease, or unspecified chronic kidney disease: Secondary | ICD-10-CM | POA: Insufficient documentation

## 2017-08-31 DIAGNOSIS — Z87891 Personal history of nicotine dependence: Secondary | ICD-10-CM | POA: Diagnosis not present

## 2017-08-31 DIAGNOSIS — R55 Syncope and collapse: Secondary | ICD-10-CM | POA: Insufficient documentation

## 2017-08-31 DIAGNOSIS — N189 Chronic kidney disease, unspecified: Secondary | ICD-10-CM | POA: Diagnosis not present

## 2017-08-31 LAB — CBC WITH DIFFERENTIAL/PLATELET
BASOS ABS: 0 10*3/uL (ref 0–0.1)
Basophils Relative: 0 %
EOS ABS: 0 10*3/uL (ref 0–0.7)
Eosinophils Relative: 0 %
HCT: 37.9 % — ABNORMAL LOW (ref 40.0–52.0)
Hemoglobin: 13.1 g/dL (ref 13.0–18.0)
LYMPHS PCT: 7 %
Lymphs Abs: 0.4 10*3/uL — ABNORMAL LOW (ref 1.0–3.6)
MCH: 31.7 pg (ref 26.0–34.0)
MCHC: 34.5 g/dL (ref 32.0–36.0)
MCV: 91.8 fL (ref 80.0–100.0)
MONO ABS: 0.4 10*3/uL (ref 0.2–1.0)
Monocytes Relative: 6 %
Neutro Abs: 5.3 10*3/uL (ref 1.4–6.5)
Neutrophils Relative %: 87 %
PLATELETS: 124 10*3/uL — AB (ref 150–440)
RBC: 4.13 MIL/uL — AB (ref 4.40–5.90)
RDW: 13.4 % (ref 11.5–14.5)
WBC: 6.2 10*3/uL (ref 3.8–10.6)

## 2017-08-31 LAB — COMPREHENSIVE METABOLIC PANEL
ALT: 57 U/L (ref 17–63)
AST: 51 U/L — ABNORMAL HIGH (ref 15–41)
Albumin: 3.8 g/dL (ref 3.5–5.0)
Alkaline Phosphatase: 62 U/L (ref 38–126)
Anion gap: 11 (ref 5–15)
BUN: 24 mg/dL — ABNORMAL HIGH (ref 6–20)
CHLORIDE: 100 mmol/L — AB (ref 101–111)
CO2: 22 mmol/L (ref 22–32)
Calcium: 8.2 mg/dL — ABNORMAL LOW (ref 8.9–10.3)
Creatinine, Ser: 1.81 mg/dL — ABNORMAL HIGH (ref 0.61–1.24)
GFR, EST AFRICAN AMERICAN: 38 mL/min — AB (ref >60)
GFR, EST NON AFRICAN AMERICAN: 32 mL/min — AB (ref >60)
Glucose, Bld: 147 mg/dL — ABNORMAL HIGH (ref 65–99)
Potassium: 3.7 mmol/L (ref 3.5–5.1)
SODIUM: 133 mmol/L — AB (ref 135–145)
Total Bilirubin: 0.8 mg/dL (ref 0.3–1.2)
Total Protein: 6.7 g/dL (ref 6.5–8.1)

## 2017-08-31 LAB — INFLUENZA PANEL BY PCR (TYPE A & B)
INFLBPCR: NEGATIVE
Influenza A By PCR: NEGATIVE

## 2017-08-31 LAB — URINALYSIS, COMPLETE (UACMP) WITH MICROSCOPIC
Bacteria, UA: NONE SEEN
Bilirubin Urine: NEGATIVE
Glucose, UA: NEGATIVE mg/dL
Hgb urine dipstick: NEGATIVE
Ketones, ur: 20 mg/dL — AB
Leukocytes, UA: NEGATIVE
Nitrite: NEGATIVE
PROTEIN: NEGATIVE mg/dL
SPECIFIC GRAVITY, URINE: 1.017 (ref 1.005–1.030)
Squamous Epithelial / LPF: NONE SEEN
pH: 6 (ref 5.0–8.0)

## 2017-08-31 LAB — GLUCOSE, CAPILLARY: GLUCOSE-CAPILLARY: 139 mg/dL — AB (ref 65–99)

## 2017-08-31 LAB — TROPONIN I

## 2017-08-31 MED ORDER — SODIUM CHLORIDE 0.9 % IV SOLN
1000.0000 mL | Freq: Once | INTRAVENOUS | Status: AC
  Administered 2017-08-31: 1000 mL via INTRAVENOUS

## 2017-08-31 NOTE — ED Provider Notes (Signed)
[facility] Emergency Department Provider Note       Time seen: ----------------------------------------- 10:18 AM on 08/31/2017 -----------------------------------------   I have reviewed the triage vital signs and the nursing notes.  HISTORY   Chief Complaint Loss of Consciousness    HPI Travis [name]. is a 84 y.o. male with a history of anemia, arrhythmia, peripheral arterial disease, hyper lipidemia, hypertension, renal insufficiency and TIA who presents to the ED for syncopal event.  Patient has been dealing with an upper respiratory infection since this past week.  He states he feels very poorly, has had significant cough and diarrhea.  He was in the waiting room at [facility] and had a syncopal event.  He thinks he is also had fever and chills.  He denies any complaints currently.  He reports he had one episode of syncope in the past with a TIA.  Past Medical History:  Diagnosis Date  . Allergic rhinitis   . Anemia, unspecified   . Cardiac dysrhythmia, unspecified   . Carotid artery occlusion    mild  . Chronic kidney disease   . Colonic polyp   . Dizziness and giddiness   . Heel spur   . Hyperlipidemia   . Hypertension   . Hypothyroidism   . OA (osteoarthritis)   . Renal insufficiency   . Stroke (HCC) 07/2016   mini   . Tinea cruris     Patient Active Problem List   Diagnosis Date Noted  . Lumbar spondylosis 03/22/2017  . Chronic bilateral low back pain without sciatica 03/22/2017  . Facet arthropathy, lumbar 03/22/2017  . PVC (premature ventricular contraction) 11/23/2013  . Hypertension     Past Surgical History:  Procedure Laterality Date  . APPENDECTOMY    . BACK SURGERY    . CIRCUMCISION    . GANGLION CYST EXCISION    . MOHS SURGERY      Allergies Morphine and related; Phenol; and Tetracyclines & related  Social History Social History   Tobacco Use  . Smoking status: Former Smoker    Years: 4.00   Types: Cigarettes  . Smokeless tobacco: Never Used  Substance Use Topics  . Alcohol use: Yes    Comment: occasional  . Drug use: No    Review of Systems Constitutional: Negative for fever. Cardiovascular: Negative for chest pain. Respiratory: Negative for shortness of breath.  Positive for cough Gastrointestinal: Negative for abdominal pain, positive for diarrhea Musculoskeletal: Negative for back pain. Skin: Negative for rash. Neurological: Negative for headaches, positive for weakness  All systems negative/normal/unremarkable except as stated in the HPI  ____________________________________________   PHYSICAL EXAM:  VITAL SIGNS: ED Triage Vitals  Enc Vitals Group     BP      Pulse      Resp      Temp      Temp src      SpO2      Weight      Height      Head Circumference      Peak Flow      Pain Score      Pain Loc      Pain Edu?      Excl. in GC?    Constitutional: Alert and oriented. Well appearing and in no distress. Eyes: Conjunctivae are normal. Normal extraocular movements. ENT   Head: Normocephalic and atraumatic.   Nose: No congestion/rhinnorhea.   Mouth/Throat: Mucous membranes are moist.   Neck: No stridor. Cardiovascular: Normal  rate, regular rhythm. No murmurs, rubs, or gallops. Respiratory: Normal respiratory effort without tachypnea nor retractions. Breath sounds are clear and equal bilaterally. No wheezes/rales/rhonchi. Gastrointestinal: Soft and nontender. Normal bowel sounds Musculoskeletal: Nontender with normal range of motion in extremities. No lower extremity tenderness nor edema. Neurologic:  Normal speech and language. No gross focal neurologic deficits are appreciated.  Skin:  Skin is warm, dry and intact.  Pallor is noted Psychiatric: Mood and affect are normal. Speech and behavior are normal.  ____________________________________________  EKG: Interpreted by me.  Sinus rhythm with a rate of 80 bpm, right bundle branch  block, wide QRS, normal QT  ____________________________________________  ED COURSE:  As part of my medical decision making, I reviewed the following data within the electronic MEDICAL RECORD NUMBER History obtained from family if available, nursing notes, old chart and ekg, as well as notes from prior ED visits. Patient presented for syncope, we will assess with labs and imaging as indicated at this time.   Procedures ____________________________________________   LABS (pertinent positives/negatives)  Labs Reviewed  GLUCOSE, CAPILLARY - Abnormal; Notable for the following components:      Result Value   Glucose-Capillary 139 (*)    All other components within normal limits  CBC WITH DIFFERENTIAL/PLATELET - Abnormal; Notable for the following components:   RBC 4.13 (*)    HCT 37.9 (*)    Platelets 124 (*)    Lymphs Abs 0.4 (*)    All other components within normal limits  COMPREHENSIVE METABOLIC PANEL - Abnormal; Notable for the following components:   Sodium 133 (*)    Chloride 100 (*)    Glucose, Bld 147 (*)    BUN 24 (*)    Creatinine, Ser 1.81 (*)    Calcium 8.2 (*)    AST 51 (*)    GFR calc non Af Amer 32 (*)    GFR calc Af Amer 38 (*)    All other components within normal limits  URINALYSIS, COMPLETE (UACMP) WITH MICROSCOPIC - Abnormal; Notable for the following components:   Color, Urine YELLOW (*)    APPearance CLEAR (*)    Ketones, ur 20 (*)    All other components within normal limits  TROPONIN I  INFLUENZA PANEL BY PCR (TYPE A & B)  CBG MONITORING, ED    RADIOLOGY Images were viewed by me  Chest x-ray Is unremarkable ____________________________________________  DIFFERENTIAL DIAGNOSIS   Dehydration, electrolyte abnormality, influenza, sepsis, arrhythmia, MI, PE  FINAL ASSESSMENT AND PLAN  Syncope   Plan: The patient had presented for syncope. Patient's labs did not reveal any acute process other than perhaps dehydration. Patient's imaging was  normal as well.  He was given a liter of fluids here and overall looks well.  I did offer admission but he and the family have declined at this point.  They are cleared for outpatient follow-up.  I will have them hold any further antibiotic intake.   Ulice DashJohnathan E Ady Heimann, MD   Note: This note was generated in part or whole with voice recognition software. Voice recognition is usually quite accurate but there are transcription errors that can and very often do occur. I apologize for any typographical errors that were not detected and corrected.     Emily FilbertWilliams, Jovaughn Wojtaszek E, MD 08/31/17 1325

## 2017-08-31 NOTE — ED Triage Notes (Signed)
[facility] with reports of syncopal episode while waiting to see PCP.  Pt has been c/o flu-like sxs for the past 2 days. Pt also reports some diarrhea.  On arrival, pt pale but alert. Denies any chest pain.

## 2017-09-02 ENCOUNTER — Other Ambulatory Visit: Payer: Self-pay

## 2017-09-02 MED ORDER — DILTIAZEM HCL ER COATED BEADS 120 MG PO CP24: 120.0000 mg | ORAL_CAPSULE | Freq: Every day | ORAL | 3 refills | Status: DC

## 2017-09-02 NOTE — Telephone Encounter (Signed)
*  STAT* If patient is at the pharmacy, call can be transferred to refill team.   1. Which medications need to be refilled? (please list name of each medication and dose if known) CARDIZEM  2. Which pharmacy/location (including street and city if local pharmacy) is medication to be sent to? [facility]  3. Do they need a 30 day or 90 day supply? 90

## 2017-09-22 ENCOUNTER — Ambulatory Visit: Payer: Medicare Other | Admitting: Cardiovascular Disease

## 2017-11-11 ENCOUNTER — Ambulatory Visit: Payer: Medicare Other | Admitting: Cardiovascular Disease

## 2017-11-11 ENCOUNTER — Encounter: Payer: Self-pay | Admitting: Cardiovascular Disease

## 2017-11-11 VITALS — BP 149/81 | HR 72 | Ht 71.0 in | Wt 216.8 lb

## 2017-11-11 DIAGNOSIS — I739 Peripheral vascular disease, unspecified: Secondary | ICD-10-CM

## 2017-11-11 DIAGNOSIS — I10 Essential (primary) hypertension: Secondary | ICD-10-CM | POA: Diagnosis not present

## 2017-11-11 DIAGNOSIS — I493 Ventricular premature depolarization: Secondary | ICD-10-CM

## 2017-11-11 DIAGNOSIS — I779 Disorder of arteries and arterioles, unspecified: Secondary | ICD-10-CM

## 2017-11-11 NOTE — Patient Instructions (Signed)
Medication Instructions: No change    Labwork: None.   Procedures/Testing: None.   Follow-Up: 1 year with Dr. Dameir Gentzler.   Any Additional Special Instructions Will Be Listed Below (If Applicable).     If you need a refill on your cardiac medications before your next appointment, please call your pharmacy.   

## 2017-11-11 NOTE — Progress Notes (Signed)
Cardiology Office Note   Date:  11/11/2017   ID:  Travis Ellis., DOB 01/08/1930, MRN [id number]  PCP:  Marguarite Arbour, MD  Cardiologist:   Lorine Bears, MD   Chief Complaint  Patient presents with  . OTHER    Syncope episodes. Meds reviewed verbally with pt.      History of Present Illness: Travis [name]. is a 84 y.o. male who presents for a followup visit regarding PVCs .  He has known history of hypertension, mild aortic stenosis, chronic kidney disease and previous stroke in February 2018. He is a previous smoker.  Echocardiogram in June 2015 showed normal LV systolic function, mild grade 1 diastolic dysfunction and mild aortic regurgitation. Holter monitor showed PACs (close to 4000) and PVCs (1400).   Most recent echocardiogram in May 2018 showed an EF of 55 to 60% with very mild aortic stenosis.  Mean gradient was 7 mmHg.  He had a severe viral illness in March and was sick for few weeks.  He had a syncopal episode and went to the emergency room.  His labs showed slight worsening of chronic kidney disease with suspected dehydration.  He was having significant dizziness at that time and was seen by neurology.  He reports that his symptoms almost completely resolved now.  He is not orthostatic today.  Denies chest pain or shortness of breath.  No palpitations.   Past Medical History:  Diagnosis Date  . Allergic rhinitis   . Anemia, unspecified   . Cardiac dysrhythmia, unspecified   . Carotid artery occlusion    mild  . Chronic kidney disease   . Colonic polyp   . Dizziness and giddiness   . Heel spur   . Hyperlipidemia   . Hypertension   . Hypothyroidism   . OA (osteoarthritis)   . Renal insufficiency   . Stroke (HCC) 07/2016   mini   . Tinea cruris     Past Surgical History:  Procedure Laterality Date  . APPENDECTOMY    . BACK SURGERY    . CIRCUMCISION    . GANGLION CYST EXCISION    . MOHS SURGERY       Current Outpatient Medications   Medication Sig Dispense Refill  . acetaminophen (TYLENOL) 500 MG tablet Take 500 mg by mouth daily as needed.    . benazepril-hydrochlorthiazide (LOTENSIN HCT) 20-12.5 MG per tablet Take 1 tablet by mouth daily.    . celecoxib (CELEBREX) 200 MG capsule Take 200 mg by mouth daily.    . clopidogrel (PLAVIX) 75 MG tablet Take 75 mg by mouth daily.    Marland Kitchen diltiazem (CARDIZEM CD) 120 MG 24 hr capsule Take 1 capsule (120 mg total) by mouth daily. 90 capsule 3  . doxazosin (CARDURA) 4 MG tablet Take 4 mg by mouth daily.    . finasteride (PROSCAR) 5 MG tablet Take 5 mg by mouth daily.    . fluticasone (FLONASE) 50 MCG/ACT nasal spray Place 1 spray into both nostrils as needed for allergies or rhinitis.    Marland Kitchen levothyroxine (SYNTHROID, LEVOTHROID) 50 MCG tablet Take 50 mcg by mouth daily before breakfast.    . polyethylene glycol (MIRALAX / GLYCOLAX) packet Take 17 g by mouth daily.    . potassium chloride SA (K-DUR,KLOR-CON) 20 MEQ tablet Take 20 mEq by mouth 2 (two) times daily.    . ranitidine (ZANTAC) 75 MG tablet Take 75 mg by mouth as needed for heartburn.     No  current facility-administered medications for this visit.     Allergies:   Morphine and related; Phenol; and Tetracyclines & related    Social History:  The patient  reports that he has quit smoking. His smoking use included cigarettes. He quit after 4.00 years of use. He has never used smokeless tobacco. He reports that he drinks alcohol. He reports that he does not use drugs.   Family History:  The patient's family history includes Dementia in his mother; Hypertension in his mother; Lung cancer in his sister; Myasthenia gravis in his father.    ROS:  Please see the history of present illness.   Otherwise, review of systems are positive for none.   All other systems are reviewed and negative.    PHYSICAL EXAM: VS:  BP (!) 149/81 (BP Location: Right Arm, Patient Position: Sitting, Cuff Size: Normal)   Pulse 72   Ht  (1.803 m)    Wt 216 lb 12 oz (98.3 kg)   BMI 30.23 kg/m  , BMI Body mass index is 30.23 kg/m. GEN: Well nourished, well developed, in no acute distress  HEENT: normal  Neck: no JVD, carotid bruits, or masses Cardiac: RRR; no rubs, or gallops,no edema . 2/7 SEM in aortic area.  Respiratory:  clear to auscultation bilaterally, normal work of breathing GI: soft, nontender, nondistended, + BS MS: no deformity or atrophy  Skin: warm and dry, no rash Neuro:  Strength and sensation are intact Psych: euthymic mood, full affect   EKG:  EKG is ordered today. The ekg ordered today demonstrates normal sinus rhythm with right bundle branch block.   Recent Labs: 08/31/2017: ALT 57; BUN 24; Creatinine, Ser 1.81; Hemoglobin 13.1; Platelets 124; Potassium 3.7; Sodium 133    Lipid Panel No results found for: CHOL, TRIG, HDL, CHOLHDL, VLDL, LDLCALC, LDLDIRECT    Wt Readings from Last 3 Encounters:  11/11/17 216 lb 12 oz (98.3 kg)  08/31/17 216 lb (98 kg)  03/22/17 210 lb (95.3 kg)      No flowsheet data found.    ASSESSMENT AND PLAN:  1.  PVCs: Symptoms are well-controlled with diltiazem. Continue same treatment.  2. Left carotid stenosis: Followed by primary care physician.   3. Essential hypertension: Blood pressure is reasonably controlled.  He is not orthostatic today.  4. Mild aortic stenosis: Repeat echocardiogram in 2 to 3 years.   Disposition:   FU with me in 1 year  Signed,  Lorine Bears, MD  11/11/2017 4:00 PM    [facility]

## 2018-07-29 DIAGNOSIS — I442 Atrioventricular block, complete: Secondary | ICD-10-CM

## 2018-07-29 HISTORY — DX: Atrioventricular block, complete: I44.2

## 2018-08-08 ENCOUNTER — Encounter: Payer: Self-pay | Admitting: Emergency Medicine

## 2018-08-08 ENCOUNTER — Other Ambulatory Visit: Payer: Self-pay

## 2018-08-08 ENCOUNTER — Encounter
Admission: EM | Disposition: A | Discharge status: Other Acute Inpatient Hospital | Payer: Self-pay | Source: Home / Self Care | Attending: Internal Medicine

## 2018-08-08 ENCOUNTER — Inpatient Hospital Stay
Admission: EM | Admit: 2018-08-08 | Discharge: 2018-08-08 | DRG: 309 | Disposition: A | Discharge status: Other Acute Inpatient Hospital | Payer: Medicare Other | Attending: Internal Medicine | Admitting: Internal Medicine

## 2018-08-08 ENCOUNTER — Inpatient Hospital Stay (HOSPITAL_COMMUNITY)
Admission: AD | Admit: 2018-08-08 | Discharge: 2018-08-10 | DRG: 243 | Disposition: A | Discharge status: Home / Self Care | Payer: Medicare Other | Source: Other Acute Inpatient Hospital | Attending: Cardiology | Admitting: Cardiology

## 2018-08-08 ENCOUNTER — Emergency Department: Payer: Medicare Other

## 2018-08-08 DIAGNOSIS — Z79890 Hormone replacement therapy (postmenopausal): Secondary | ICD-10-CM | POA: Diagnosis not present

## 2018-08-08 DIAGNOSIS — Z87891 Personal history of nicotine dependence: Secondary | ICD-10-CM | POA: Diagnosis not present

## 2018-08-08 DIAGNOSIS — I129 Hypertensive chronic kidney disease with stage 1 through stage 4 chronic kidney disease, or unspecified chronic kidney disease: Secondary | ICD-10-CM | POA: Diagnosis present

## 2018-08-08 DIAGNOSIS — N183 Chronic kidney disease, stage 3 (moderate): Secondary | ICD-10-CM | POA: Diagnosis present

## 2018-08-08 DIAGNOSIS — Z881 Allergy status to other antibiotic agents status: Secondary | ICD-10-CM | POA: Diagnosis not present

## 2018-08-08 DIAGNOSIS — M1990 Unspecified osteoarthritis, unspecified site: Secondary | ICD-10-CM | POA: Diagnosis present

## 2018-08-08 DIAGNOSIS — N179 Acute kidney failure, unspecified: Secondary | ICD-10-CM | POA: Diagnosis present

## 2018-08-08 DIAGNOSIS — Z8673 Personal history of transient ischemic attack (TIA), and cerebral infarction without residual deficits: Secondary | ICD-10-CM

## 2018-08-08 DIAGNOSIS — Z8719 Personal history of other diseases of the digestive system: Secondary | ICD-10-CM | POA: Diagnosis not present

## 2018-08-08 DIAGNOSIS — E039 Hypothyroidism, unspecified: Secondary | ICD-10-CM | POA: Diagnosis present

## 2018-08-08 DIAGNOSIS — Z95818 Presence of other cardiac implants and grafts: Secondary | ICD-10-CM

## 2018-08-08 DIAGNOSIS — Z818 Family history of other mental and behavioral disorders: Secondary | ICD-10-CM

## 2018-08-08 DIAGNOSIS — Z885 Allergy status to narcotic agent status: Secondary | ICD-10-CM

## 2018-08-08 DIAGNOSIS — I447 Left bundle-branch block, unspecified: Secondary | ICD-10-CM | POA: Diagnosis present

## 2018-08-08 DIAGNOSIS — E785 Hyperlipidemia, unspecified: Secondary | ICD-10-CM | POA: Diagnosis present

## 2018-08-08 DIAGNOSIS — N182 Chronic kidney disease, stage 2 (mild): Secondary | ICD-10-CM | POA: Diagnosis present

## 2018-08-08 DIAGNOSIS — Z801 Family history of malignant neoplasm of trachea, bronchus and lung: Secondary | ICD-10-CM

## 2018-08-08 DIAGNOSIS — Z7902 Long term (current) use of antithrombotics/antiplatelets: Secondary | ICD-10-CM

## 2018-08-08 DIAGNOSIS — I6529 Occlusion and stenosis of unspecified carotid artery: Secondary | ICD-10-CM | POA: Diagnosis present

## 2018-08-08 DIAGNOSIS — I442 Atrioventricular block, complete: Principal | ICD-10-CM

## 2018-08-08 DIAGNOSIS — Z7951 Long term (current) use of inhaled steroids: Secondary | ICD-10-CM

## 2018-08-08 DIAGNOSIS — I959 Hypotension, unspecified: Secondary | ICD-10-CM | POA: Diagnosis present

## 2018-08-08 DIAGNOSIS — Z8249 Family history of ischemic heart disease and other diseases of the circulatory system: Secondary | ICD-10-CM

## 2018-08-08 DIAGNOSIS — Z9109 Other allergy status, other than to drugs and biological substances: Secondary | ICD-10-CM | POA: Diagnosis not present

## 2018-08-08 HISTORY — PX: TEMPORARY PACEMAKER: CATH118268

## 2018-08-08 HISTORY — DX: Family history of other specified conditions: Z84.89

## 2018-08-08 HISTORY — DX: Presence of cardiac pacemaker: Z95.0

## 2018-08-08 HISTORY — DX: Atrioventricular block, complete: I44.2

## 2018-08-08 LAB — BASIC METABOLIC PANEL
Anion gap: 9 (ref 5–15)
BUN: 48 mg/dL — ABNORMAL HIGH (ref 8–23)
CALCIUM: 8.7 mg/dL — AB (ref 8.9–10.3)
CHLORIDE: 101 mmol/L (ref 98–111)
CO2: 23 mmol/L (ref 22–32)
CREATININE: 2.25 mg/dL — AB (ref 0.61–1.24)
GFR calc non Af Amer: 25 mL/min — ABNORMAL LOW (ref >60)
GFR, EST AFRICAN AMERICAN: 30 mL/min — AB (ref >60)
Glucose, Bld: 120 mg/dL — ABNORMAL HIGH (ref 70–99)
Potassium: 4.2 mmol/L (ref 3.5–5.1)
SODIUM: 133 mmol/L — AB (ref 135–145)

## 2018-08-08 LAB — CBC
HCT: 35.1 % — ABNORMAL LOW (ref 39.0–52.0)
Hemoglobin: 11.4 g/dL — ABNORMAL LOW (ref 13.0–17.0)
MCH: 30.6 pg (ref 26.0–34.0)
MCHC: 32.5 g/dL (ref 30.0–36.0)
MCV: 94.4 fL (ref 80.0–100.0)
NRBC: 0 % (ref 0.0–0.2)
PLATELETS: 153 10*3/uL (ref 150–400)
RBC: 3.72 MIL/uL — ABNORMAL LOW (ref 4.22–5.81)
RDW: 13 % (ref 11.5–15.5)
WBC: 8.1 10*3/uL (ref 4.0–10.5)

## 2018-08-08 LAB — TROPONIN I
TROPONIN I: 0.06 ng/mL — AB (ref <0.03)
Troponin I: 0.06 ng/mL (ref <0.03)

## 2018-08-08 LAB — MAGNESIUM: Magnesium: 2.1 mg/dL (ref 1.7–2.4)

## 2018-08-08 LAB — PHOSPHORUS: Phosphorus: 3.6 mg/dL (ref 2.5–4.6)

## 2018-08-08 LAB — MRSA PCR SCREENING: MRSA by PCR: NEGATIVE

## 2018-08-08 LAB — GLUCOSE, CAPILLARY
GLUCOSE-CAPILLARY: 104 mg/dL — AB (ref 70–99)
Glucose-Capillary: 107 mg/dL — ABNORMAL HIGH (ref 70–99)

## 2018-08-08 LAB — TSH: TSH: 4.568 u[IU]/mL — ABNORMAL HIGH (ref 0.350–4.500)

## 2018-08-08 SURGERY — TEMPORARY PACEMAKER
Anesthesia: LOCAL

## 2018-08-08 MED ORDER — FAMOTIDINE 20 MG PO TABS
10.0000 mg | ORAL_TABLET | Freq: Every day | ORAL | Status: DC
  Administered 2018-08-08: 10 mg via ORAL
  Filled 2018-08-08: qty 1

## 2018-08-08 MED ORDER — ENOXAPARIN SODIUM 30 MG/0.3ML ~~LOC~~ SOLN: 30.0000 mg | SUBCUTANEOUS | Status: DC

## 2018-08-08 MED ORDER — SODIUM CHLORIDE 0.9 % IV SOLN: Freq: Once | INTRAVENOUS | Status: DC

## 2018-08-08 MED ORDER — SODIUM CHLORIDE 0.9 % IV BOLUS: 250.0000 mL | Freq: Once | INTRAVENOUS | Status: DC

## 2018-08-08 MED ORDER — FINASTERIDE 5 MG PO TABS
5.0000 mg | ORAL_TABLET | Freq: Every day | ORAL | Status: DC
  Administered 2018-08-08: 5 mg via ORAL
  Filled 2018-08-08: qty 1

## 2018-08-08 MED ORDER — LEVOTHYROXINE SODIUM 50 MCG PO TABS
50.0000 ug | ORAL_TABLET | Freq: Every day | ORAL | Status: DC
  Administered 2018-08-09 – 2018-08-10 (×2): 50 ug via ORAL
  Filled 2018-08-08 (×2): qty 1

## 2018-08-08 MED ORDER — SODIUM CHLORIDE 0.9 % IV SOLN: INTRAVENOUS | Status: DC

## 2018-08-08 MED ORDER — CALCIUM CARBONATE ANTACID 500 MG PO CHEW
1.0000 | CHEWABLE_TABLET | Freq: Two times a day (BID) | ORAL | Status: DC | PRN
  Administered 2018-08-08: 200 mg via ORAL
  Filled 2018-08-08 (×2): qty 1

## 2018-08-08 MED ORDER — POLYETHYLENE GLYCOL 3350 17 G PO PACK
17.0000 g | PACK | Freq: Every day | ORAL | Status: DC | PRN
  Administered 2018-08-10: 17 g via ORAL
  Filled 2018-08-08: qty 1

## 2018-08-08 MED ORDER — ACETAMINOPHEN 325 MG PO TABS: 650.0000 mg | ORAL_TABLET | Freq: Four times a day (QID) | ORAL | Status: DC | PRN

## 2018-08-08 MED ORDER — SODIUM CHLORIDE 0.9% FLUSH: 3.0000 mL | Freq: Two times a day (BID) | INTRAVENOUS | Status: DC

## 2018-08-08 MED ORDER — ONDANSETRON HCL 4 MG/2ML IJ SOLN: 4.0000 mg | Freq: Four times a day (QID) | INTRAMUSCULAR | Status: DC | PRN

## 2018-08-08 MED ORDER — SODIUM CHLORIDE 0.9% FLUSH: 3.0000 mL | INTRAVENOUS | Status: DC | PRN

## 2018-08-08 MED ORDER — FAMOTIDINE 20 MG PO TABS
10.0000 mg | ORAL_TABLET | Freq: Every day | ORAL | Status: DC
  Administered 2018-08-09 – 2018-08-10 (×2): 10 mg via ORAL
  Filled 2018-08-08 (×2): qty 1

## 2018-08-08 MED ORDER — SODIUM CHLORIDE 0.9 % IV SOLN: 250.0000 mL | INTRAVENOUS | Status: DC | PRN

## 2018-08-08 MED ORDER — FINASTERIDE 5 MG PO TABS
5.0000 mg | ORAL_TABLET | Freq: Every day | ORAL | Status: DC
  Administered 2018-08-09 – 2018-08-10 (×2): 5 mg via ORAL
  Filled 2018-08-08 (×2): qty 1

## 2018-08-08 MED ORDER — ACETAMINOPHEN 500 MG PO TABS
500.0000 mg | ORAL_TABLET | Freq: Four times a day (QID) | ORAL | Status: DC | PRN
  Administered 2018-08-08 – 2018-08-10 (×3): 500 mg via ORAL
  Filled 2018-08-08 (×3): qty 1

## 2018-08-08 MED ORDER — CLOPIDOGREL BISULFATE 75 MG PO TABS
75.0000 mg | ORAL_TABLET | Freq: Every day | ORAL | Status: DC
  Administered 2018-08-09 – 2018-08-10 (×2): 75 mg via ORAL
  Filled 2018-08-08 (×2): qty 1

## 2018-08-08 MED ORDER — LEVOTHYROXINE SODIUM 50 MCG PO TABS: 50.0000 ug | ORAL_TABLET | Freq: Every day | ORAL | Status: DC

## 2018-08-08 MED ORDER — POLYETHYLENE GLYCOL 3350 17 G PO PACK
17.0000 g | PACK | Freq: Every day | ORAL | Status: DC
  Administered 2018-08-08: 17 g via ORAL
  Filled 2018-08-08: qty 1

## 2018-08-08 MED ORDER — CLOPIDOGREL BISULFATE 75 MG PO TABS
75.0000 mg | ORAL_TABLET | Freq: Every day | ORAL | Status: DC
  Administered 2018-08-08: 75 mg via ORAL
  Filled 2018-08-08: qty 1

## 2018-08-08 MED ORDER — ACETAMINOPHEN 650 MG RE SUPP: 650.0000 mg | Freq: Four times a day (QID) | RECTAL | Status: DC | PRN

## 2018-08-08 MED ORDER — ENOXAPARIN SODIUM 40 MG/0.4ML ~~LOC~~ SOLN: 40.0000 mg | SUBCUTANEOUS | Status: DC

## 2018-08-08 MED ORDER — FLUTICASONE PROPIONATE 50 MCG/ACT NA SUSP
1.0000 | NASAL | Status: DC | PRN
  Filled 2018-08-08: qty 16

## 2018-08-08 MED ORDER — ATROPINE SULFATE 1 MG/10ML IJ SOSY
0.5000 mg | PREFILLED_SYRINGE | Freq: Once | INTRAMUSCULAR | Status: AC
  Administered 2018-08-08: 0.5 mg via INTRAVENOUS
  Filled 2018-08-08: qty 10

## 2018-08-08 MED ORDER — SODIUM CHLORIDE 0.9% FLUSH
3.0000 mL | Freq: Two times a day (BID) | INTRAVENOUS | Status: DC
  Administered 2018-08-08 – 2018-08-09 (×3): 3 mL via INTRAVENOUS

## 2018-08-08 MED ORDER — ONDANSETRON HCL 4 MG PO TABS: 4.0000 mg | ORAL_TABLET | Freq: Four times a day (QID) | ORAL | Status: DC | PRN

## 2018-08-08 MED ORDER — POTASSIUM CHLORIDE CRYS ER 20 MEQ PO TBCR
20.0000 meq | EXTENDED_RELEASE_TABLET | Freq: Two times a day (BID) | ORAL | Status: DC
  Administered 2018-08-08: 20 meq via ORAL
  Filled 2018-08-08: qty 1

## 2018-08-08 SURGICAL SUPPLY — 8 items
CABLE ADAPT CONN TEMP 6FT (ADAPTER) ×2 IMPLANT
CANNULA 5F STIFF (CANNULA) ×2 IMPLANT
NEEDLE PERC 18GX7CM (NEEDLE) ×2 IMPLANT
PACK CARDIAC CATH (CUSTOM PROCEDURE TRAY) ×2 IMPLANT
SHEATH AVANTI 6FR X 11CM (SHEATH) ×2 IMPLANT
SLEEVE REPOSITIONING LENGTH 30 (MISCELLANEOUS) ×2 IMPLANT
WIRE GUIDERIGHT .035X150 (WIRE) ×2 IMPLANT
WIRE PACING TEMP ST TIP 5 (CATHETERS) ×2 IMPLANT

## 2018-08-08 NOTE — Consult Note (Signed)
Cardiology Consultation:   Patient ID: Travis D Thies [name] MRN: [id number]; DOB: 08/29/1929  Admit date: 08/08/2018 Date of Consult: 08/08/2018  Primary Care Provider: Sparks, Jeffrey D, MD Primary Cardiologist:Dr. Arida, [facility] Primary Electrophysiologist:  None    Patient Profile:   Travis D Marchio [name] is a 85 y.o. male with a hx of PVCs / ectopy, syncopal episodes, hypertension, mild aortic stenosis, G1DD, CKD, hyperlipidemia, L carotid stenosis (followed by PCP), hypothyroidism (synthroid), previous stroke / TIA February 2018, and remote history of smoking cigarettes who is being seen today for the evaluation of complete heart block at the request of Dr. Patel.  History of Present Illness:   Travis [name] is an 85-year-old male with PMH as above and including symptomatic PVCs (treated with diltiazem), past syncopal episodes, hypothyroid, and remote history of stroke February 2018.  The episode was reportedly sudden and not preceded by any symptoms. He was confused after the episode and it was witnessed by his son-in-law. He was taken to the emergency room and brain MRI showed small subacute lacunar infarct in the anterior left MCA territory  11/2013 Holter monitor showed PACs (close to 4000) and PVCs (1400).  In 07/2016, he reportedly had a stroke/TIA.  Echocardiogram later that year in May 2018 showed EF 55% to 60% with mild aortic stenosis.  Mean gradient was 7 mmHg.    In March 2019, he reportedly had a severe viral illness with a syncopal episode and presented to [facility] ED.  His labs at that time showed worsening chronic kidney disease and suspicion for dehydration.  He noted significant dizziness and was therefore seen by neurology with no acute findings.   Last seen in the office 11/11/2017.  At that time, he reported his symptoms of dizziness were almost completely resolved.  He denied orthostatic hypotension.  He denied chest pains or shortness of breath. EKG 11/11/17 showed NSR 72 bpm with  right bundle branch block. It was felt that his PVC symptoms were well controlled with diltiazem with plan for continued medical management.  Plan was also for repeat echocardiogram in 2 to 3 years for his mild aortic stenosis. PTA medications were continued including Plavix 75 mg daily (s/p stroke), Lotensin HCT 20-12.5 mg daily, Cardizem CD 120 mg qd, Cardura 4 mg qd, Proscar 5 mg qd, Synthroid 50 MCG daily before breakfast, KCl 20 mEq twice daily, and Zantac 75 mg PRN.     Of note, patient reported recent low blood pressure at [facility] for which he had recently had stopped his losartan but otherwise no reported medication changes.  He also reported an 8 pound weight loss that was unintentional and due to following weight watchers.  On 08/08/2018, patient presented to [facility] ED with complaint of "feeling swimmy in the head."The patient stated that he started to feel poorly approximately 3 weeks ago at nighttime, during which time the daughter felt like he had " a seizure."  He stated this feeling progressed over the next few weeks with another episode of feeling poorly and "swimmy in the head" occurring 2 nights ago in the evening (2/9).  Per patient, he "fell out" at that time, but without LOC (LOC estimated at approximately 1 second or less) and episode not witnessed by daughter or son-in-law, both of whom were in the next room and came to his assistance right away.  Patient experienced this episode at rest (sitting on the couch) and denied any other symptoms, except stomach tightness but without nausea or vomiting.    In the ED, vitals significant for bradycardic rates in the low 30s with SBP 140s-80s and oxygen saturations 95-97% ORA. Patient asx and reported no additional symptoms since his episode on the couch; however, he had not tried to ambulate given his earlier episode. ED Labs significant for K 4.2, Cr 2.25, Troponin 0.06, WBC 8.1, Hgb 11.4. Pending TSH and repeat echo.  EKG significant for  complete heart block with with telemetry showing continued rates in the 30s.  In the ED, he received saline bolus and atropine. Given patient's complete heart block, cardiology was consulted. After discussing the case with EP, it was decided the patient would be transferred to [place] for further evaluation and placement of pacemaker.  Patient reportedly last took his diltiazem yesterday (08/07/2018) at "sometime in the AM." Of note, takes Plavix as above. Made NPO.   Past Medical History:  Diagnosis Date  . Allergic rhinitis   . Anemia, unspecified   . Cardiac dysrhythmia, unspecified   . Carotid artery occlusion    mild  . Chronic kidney disease   . Colonic polyp   . Dizziness and giddiness   . Heel spur   . Hyperlipidemia   . Hypertension   . Hypothyroidism   . OA (osteoarthritis)   . Renal insufficiency   . Stroke (HCC) 07/2016   mini   . Tinea cruris     Past Surgical History:  Procedure Laterality Date  . APPENDECTOMY    . BACK SURGERY    . CIRCUMCISION    . GANGLION CYST EXCISION    . MOHS SURGERY       Home Medications:  Prior to Admission medications   Medication Sig Start Date End Date Taking? Authorizing Provider  celecoxib (CELEBREX) 200 MG capsule Take 200 mg by mouth daily.   Yes [provider]  clopidogrel (PLAVIX) 75 MG tablet Take 75 mg by mouth daily. 09/14/16  Yes [provider]  diclofenac sodium (VOLTAREN) 1 % GEL Apply 2 g topically 4 (four) times daily. 07/14/18  Yes [provider]  diltiazem (CARDIZEM CD) 120 MG 24 hr capsule Take 1 capsule (120 mg total) by mouth daily. 09/02/17  Yes Arida, Muhammad A, MD  doxazosin (CARDURA) 4 MG tablet Take 4 mg by mouth daily.   Yes [provider]  famotidine (PEPCID) 40 MG tablet Take 40 mg by mouth daily.   Yes [provider]  finasteride (PROSCAR) 5 MG tablet Take 5 mg by mouth daily.   Yes [provider]  levothyroxine (SYNTHROID, LEVOTHROID) 50 MCG  tablet Take 50 mcg by mouth daily before breakfast.   Yes [provider]  polyethylene glycol (MIRALAX / GLYCOLAX) packet Take 17 g by mouth daily.   Yes [provider]  potassium chloride SA (K-DUR,KLOR-CON) 20 MEQ tablet Take 20 mEq by mouth 2 (two) times daily.   Yes [provider]  acetaminophen (TYLENOL) 500 MG tablet Take 500 mg by mouth daily as needed.    [provider]  fluticasone (FLONASE) 50 MCG/ACT nasal spray Place 1 spray into both nostrils as needed for allergies or rhinitis.    [provider]    Inpatient Medications: Scheduled Meds: . clopidogrel  75 mg Oral Daily  . enoxaparin (LOVENOX) injection  40 mg Subcutaneous Q24H  . famotidine  10 mg Oral Daily  . finasteride  5 mg Oral Daily  . [START ON 08/09/2018] levothyroxine  50 mcg Oral QAC breakfast  . polyethylene glycol  17 g   Oral Daily  . potassium chloride SA  20 mEq Oral BID   Continuous Infusions: . sodium chloride    . sodium chloride    . sodium chloride     PRN Meds: acetaminophen **OR** acetaminophen, fluticasone, ondansetron **OR** ondansetron (ZOFRAN) IV  Allergies:    Allergies  Allergen Reactions  . Morphine And Related   . Phenol     Other reaction(s): Unknown  . Tetracyclines & Related     Other reaction(s): UNKNOWN    Social History:   Social History   Socioeconomic History  . Marital status: Married    Spouse name: Not on file  . Number of children: Not on file  . Years of education: Not on file  . Highest education level: Not on file  Occupational History  . Not on file  Social Needs  . Financial resource strain: Not on file  . Food insecurity:    Worry: Not on file    Inability: Not on file  . Transportation needs:    Medical: Not on file    Non-medical: Not on file  Tobacco Use  . Smoking status: Former Smoker    Years: 4.00    Types: Cigarettes  . Smokeless tobacco: Never Used  Substance and Sexual Activity  . Alcohol  use: Yes    Comment: occasional  . Drug use: No  . Sexual activity: Not on file  Lifestyle  . Physical activity:    Days per week: Not on file    Minutes per session: Not on file  . Stress: Not on file  Relationships  . Social connections:    Talks on phone: Not on file    Gets together: Not on file    Attends religious service: Not on file    Active member of club or organization: Not on file    Attends meetings of clubs or organizations: Not on file    Relationship status: Not on file  . Intimate partner violence:    Fear of current or ex partner: Not on file    Emotionally abused: Not on file    Physically abused: Not on file    Forced sexual activity: Not on file  Other Topics Concern  . Not on file  Social History Narrative  . Not on file    Family History:    Family History  Problem Relation Age of Onset  . Hypertension Mother   . Dementia Mother   . Myasthenia gravis Father   . Lung cancer Sister      ROS:  Please see the history of present illness.  Review of Systems  Respiratory: Positive for shortness of breath.   Cardiovascular: Positive for palpitations. Negative for chest pain and leg swelling.  Gastrointestinal: Positive for abdominal pain. Negative for nausea and vomiting.  Musculoskeletal: Positive for falls.  Neurological: Positive for dizziness, loss of consciousness and weakness.       Not witnessed LOC, estimated less than 1 second "swimmy headed"  All other systems reviewed and are negative.   All other ROS reviewed and negative.     Physical Exam/Data:   Vitals:   08/08/18 1030 08/08/18 1110 08/08/18 1130 08/08/18 1131  BP: (!) 140/46 114/63  116/60  Pulse: (!) 31 (!) 31 (!) 39 (!) 32  Resp: 10 19 11 12  Temp:      SpO2: 96% 95% 96% 97%  Weight:      Height:       No intake or output   data in the 24 hours ending 08/08/18 1207 Filed Weights   08/08/18 0944  Weight: 94.8 kg   Body mass index is 29.15 kg/m.  General:  Elderly  gentleman in NAD HEENT: normal Neck: no JVD Vascular: Radial pulses 2+ bilaterally  Cardiac:  bradycardic; 1/6 systolic murmur Lungs:  clear to auscultation bilaterally, no wheezing, rhonchi or rales  Abd: soft, nontender, no hepatomegaly  Ext: no b/l LE edema Musculoskeletal:  No deformities Skin: warm and dry  Neuro:  no focal abnormalities noted Psych:  Normal affect   EKG:  The EKG was personally reviewed and demonstrates:  CHB Telemetry:  Telemetry was personally reviewed and demonstrates:  CHB, rates in 30s, ectopy  Relevant CV Studies:  Pending updated echo  TTE  10/26/2016 Study Conclusions - Left ventricle: The cavity size was normal. There was moderate   concentric hypertrophy. Systolic function was normal. The   estimated ejection fraction was in the range of 55% to 60%. Wall   motion was normal; there were no regional wall motion   abnormalities. Doppler parameters are consistent with abnormal   left ventricular relaxation (grade 1 diastolic dysfunction). - Aortic valve: There was very mild stenosis. There was trivial   regurgitation. Mean gradient (S): 7 mm Hg. Valve area (VTI): 2.54   cm^2.  12/06/2013 Holter 48h monitor under the EKG tab  Laboratory Data:  Chemistry Recent Labs  Lab 08/08/18 0949  NA 133*  K 4.2  CL 101  CO2 23  GLUCOSE 120*  BUN 48*  CREATININE 2.25*  CALCIUM 8.7*  GFRNONAA 25*  GFRAA 30*  ANIONGAP 9    No results for input(s): PROT, ALBUMIN, AST, ALT, ALKPHOS, BILITOT in the last 168 hours. Hematology Recent Labs  Lab 08/08/18 0949  WBC 8.1  RBC 3.72*  HGB 11.4*  HCT 35.1*  MCV 94.4  MCH 30.6  MCHC 32.5  RDW 13.0  PLT 153   Cardiac Enzymes Recent Labs  Lab 08/08/18 0949  TROPONINI 0.06*   No results for input(s): TROPIPOC in the last 168 hours.  BNPNo results for input(s): BNP, PROBNP in the last 168 hours.  DDimer No results for input(s): DDIMER in the last 168 hours.  Radiology/Studies:  Dg Chest Portable  1 View  Result Date: 08/08/2018 CLINICAL DATA:  85-year-old who had a syncopal episode while sitting on a couch at home and presents with bradycardia (heart rate 30). EXAM: PORTABLE CHEST 1 VIEW COMPARISON:  08/31/2017. FINDINGS: External pacing pads. Cardiac silhouette mildly to moderately enlarged for the AP portable technique, unchanged. Stable chronic elevation of the RIGHT hemidiaphragm. Lungs clear. Bronchovascular markings normal. Pulmonary vascularity normal. No visible pleural effusions. No pneumothorax. IMPRESSION: Stable cardiomegaly. No acute cardiopulmonary disease. Electronically Signed   By: Thomas  Lawrence M.D.   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 Arida. Holter as in HPI. PTA diltiazem - Patient remains asymptomatic with rates in the low 30s as above and due to CHB.  Plan is for transfer to [facility] for pacemaker. He has been placed on temporary pacer pads and is n.p.o. in preparation for transfer.  Pacemaker implantation reviewed with both patient and daughter and both are agreeable to transfer.  Patient is hemodynamically stable.  - Patient reported last diltiazem taken yesterday 08/07/2018 at "sometime in the morning."  Of note, he is also on Plavix 75 mg in the   setting of remote stroke as above.  He is not on any anticoagulation.  Continue to hold AV nodal blocking agents in preparation for pacemaker implantation.   - Daily BMET and CBC recommended. Cr 2.25 (08/2017 Cr 1.81), K 4.2. Hgb 11.4 (08/2017 13.1). TSH labs pending update, given known hypothyroidism.  Echocardiogram pending with known G1DD, preserved EF, known AS.  CXR showed no acute disease. Continue to monitor on telemetry and monitor vitals.  Internal medicine has been notified of the transfer and EMTALA form completed.  [place] [facility] has also been notified of the transfer and [facility] called.    Hypothyroidism - Updated TSH with 10/2017 TSH 5.574. On synthroid.  HTN - Labile BP/ hypotensive  CKD - Cr 2.25. Baseline Cr uncertain with last Cr 08/2017 at 1.81. Pending Mg update   HLD - No chest pain. Last LDL 104 on 10/2017. Consider recheck. Not on PTA statin.  Carotid Stenosis - Followed by PCP  H/o stroke (07/2016) - As in HPI. On Plavix 75mg daily.  For questions or updates, please contact [facility] Please consult [email or web address] for contact info under     Signed, Averey Koning D Donivan Thammavong, PA-C  08/08/2018 12:07 PM   

## 2018-08-08 NOTE — Progress Notes (Signed)
Patient back from procedure. [facility]. Dr Roxana Hiresamedene accepting physician. [facility] Shannon from [facility] updated and on their way. Patients daughter in room and updated as well. Per Dr End temporary pacemaker settings- Rate 70, output 10, and sense 2. Patient alert, no complaints of pain, dizziness. Vitals remain stable. Being tx.

## 2018-08-08 NOTE — ED Notes (Signed)
ED TO INPATIENT HANDOFF REPORT  ED Nurse Name and Phone #:  Sloan Leiter #[phone]  Name/Age/Gender Travis [name] 85 y.o. male Room/Bed: ED10A/ED10A  Code Status   Code Status: Not on file  Home/SNF/Other Home Patient oriented to: self, place, time and situation Is this baseline? Yes   Triage Complete: Triage complete  Chief Complaint syncope ems  Triage Note Pt arrived with via ems from after syncopal episode while pt was on the couch. Pt arrives with a HR of 30. Pt arrives with no complaints of pain but reports dizziness for "a few days." Pt saw his PCP yesterday with no acute finding. Syncopal episode was witnessed by son in law who reports episode "lasted a few seconds."    Allergies Allergies  Allergen Reactions  . Morphine And Related   . Phenol     Other reaction(s): Unknown  . Tetracyclines & Related     Other reaction(s): UNKNOWN    Level of Care/Admitting Diagnosis ED Disposition    ED Disposition Condition Comment   Admit  Hospital Area: [facility] [[id number]]  Level of Care: ICU [6]  Diagnosis: Complete heart block [facility]) [[id number]]  Admitting Physician: Joselyn Glassman  Attending Physician: Joselyn Glassman  Estimated length of stay: past midnight tomorrow  Certification:: I certify this patient will need inpatient services for at least 2 midnights  PT Class (Do Not Modify): Inpatient [101]  PT Acc Code (Do Not Modify): Private [1]       Medical/Surgery History Past Medical History:  Diagnosis Date  . Allergic rhinitis   . Anemia, unspecified   . Cardiac dysrhythmia, unspecified   . Carotid artery occlusion    mild  . Chronic kidney disease   . Colonic polyp   . Dizziness and giddiness   . Heel spur   . Hyperlipidemia   . Hypertension   . Hypothyroidism   . OA (osteoarthritis)   . Renal insufficiency   . Stroke (HCC) 07/2016   mini   . Tinea cruris    Past Surgical History:  Procedure Laterality Date  .  APPENDECTOMY    . BACK SURGERY    . CIRCUMCISION    . GANGLION CYST EXCISION    . MOHS SURGERY       IV Location/Drains/Wounds Patient Lines/Drains/Airways Status   Active Line/Drains/Airways    Name:   Placement date:   Placement time:   Site:   Days:   Peripheral IV 08/08/18 Right Antecubital   08/08/18    0950    Antecubital   less than 1   Peripheral IV 08/08/18 Left Antecubital   08/08/18    1002    Antecubital   less than 1          Intake/Output Last 24 hours No intake or output data in the 24 hours ending 08/08/18 1138  Labs/Imaging Results for orders placed or performed during the hospital encounter of 08/08/18 (from the past 48 hour(s))  Basic metabolic panel     Status: Abnormal   Collection Time: 08/08/18  9:49 AM  Result Value Ref Range   Sodium 133 (L) 135 - 145 mmol/L   Potassium 4.2 3.5 - 5.1 mmol/L   Chloride 101 98 - 111 mmol/L   CO2 23 22 - 32 mmol/L   Glucose, Bld 120 (H) 70 - 99 mg/dL   BUN 48 (H) 8 - 23 mg/dL   Creatinine, Ser 0.98 (H) 0.61 - 1.24 mg/dL   Calcium 8.7 (  L) 8.9 - 10.3 mg/dL   GFR calc non Af Amer 25 (L) >60 mL/min   GFR calc Af Amer 30 (L) >60 mL/min   Anion gap 9 5 - 15    Comment: Performed at [facility], [address] [place], [place] [address]  CBC     Status: Abnormal   Collection Time: 08/08/18  9:49 AM  Result Value Ref Range   WBC 8.1 4.0 - 10.5 K/uL   RBC 3.72 (L) 4.22 - 5.81 MIL/uL   Hemoglobin 11.4 (L) 13.0 - 17.0 g/dL   HCT 49.4 (L) 49.6 - 75.9 %   MCV 94.4 80.0 - 100.0 fL   MCH 30.6 26.0 - 34.0 pg   MCHC 32.5 30.0 - 36.0 g/dL   RDW 16.3 84.6 - 65.9 %   Platelets 153 150 - 400 K/uL   nRBC 0.0 0.0 - 0.2 %    Comment: Performed at [facility], [address] [place], [place] [address]  Troponin I - ONCE - STAT     Status: Abnormal   Collection Time: 08/08/18  9:49 AM  Result Value Ref Range   Troponin I 0.06 (HH) <0.03 ng/mL    Comment: CRITICAL RESULT CALLED TO, READ BACK BY AND VERIFIED  WITH ALICA Lando Alcalde AT 1041 ON 08/08/2018 SMA Performed at [facility], [address] [place], [place] [address]   Glucose, capillary     Status: Abnormal   Collection Time: 08/08/18 10:01 AM  Result Value Ref Range   Glucose-Capillary 104 (H) 70 - 99 mg/dL   Dg Chest Portable 1 View  Result Date: 08/08/2018 CLINICAL DATA:  85 year old who had a syncopal episode while sitting on a couch at home and presents with bradycardia (heart rate 30). EXAM: PORTABLE CHEST 1 VIEW COMPARISON:  08/31/2017. FINDINGS: External pacing pads. Cardiac silhouette mildly to moderately enlarged for the AP portable technique, unchanged. Stable chronic elevation of the RIGHT hemidiaphragm. Lungs clear. Bronchovascular markings normal. Pulmonary vascularity normal. No visible pleural effusions. No pneumothorax. IMPRESSION: Stable cardiomegaly. No acute cardiopulmonary disease. Electronically Signed   By: Hulan Saas M.D.   On: 08/08/2018 10:41    Pending Labs Unresulted Labs (From admission, onward)    Start     Ordered   08/08/18 1131  TSH  Add-on,   AD     08/08/18 1130   08/08/18 0945  Urinalysis, Complete w Microscopic  ONCE - STAT,   STAT     08/08/18 0945   Signed and Held  CBC  (enoxaparin (LOVENOX)    CrCl >/= 30 ml/min)  Once,   R    Comments:  Baseline for enoxaparin therapy IF NOT ALREADY DRAWN.  Notify MD if PLT < 100 K.    Signed and Held   Signed and Held  Creatinine, serum  (enoxaparin (LOVENOX)    CrCl >/= 30 ml/min)  Once,   R    Comments:  Baseline for enoxaparin therapy IF NOT ALREADY DRAWN.    Signed and Held   Signed and Held  Creatinine, serum  (enoxaparin (LOVENOX)    CrCl >/= 30 ml/min)  Weekly,   R    Comments:  while on enoxaparin therapy    Signed and Held   Signed and Held  Magnesium  Add-on,   R     Signed and Held   Signed and Held  Phosphorus  Add-on,   R     Signed and Held   Signed and Held  Troponin I - Now Then Q6H  Now then every 6 hours,   R     Signed  and Held   Signed and Held  Basic metabolic panel  Tomorrow morning,   R     Signed and Held          Vitals/Pain Today's Vitals   08/08/18 1030 08/08/18 1110 08/08/18 1130 08/08/18 1131  BP: (!) 140/46 114/63  116/60  Pulse: (!) 31 (!) 31 (!) 39 (!) 32  Resp: 10 19 11 12   Temp:      SpO2: 96% 95% 96% 97%  Weight:      Height:      PainSc:        Isolation Precautions No active isolations  Medications Medications  sodium chloride 0.9 % bolus 250 mL (has no administration in time range)  0.9 %  sodium chloride infusion (has no administration in time range)  atropine 1 MG/10ML injection 0.5 mg (0.5 mg Intravenous Given 08/08/18 0958)    Mobility walks Moderate fall risk   Recommendations: See Admitting Provider Note  Report given to:  Jamie (CCU)

## 2018-08-08 NOTE — H&P (View-Only) (Signed)
Cardiology Consultation:   Patient ID: Travis [name]. MRN: [id number]; DOB: 1930/01/19  Admit date: 08/08/2018 Date of Consult: 08/08/2018  Primary Care Provider: Marguarite Arbour, MD Primary Cardiologist:Dr. Cathey Endow Primary Electrophysiologist:  None    Patient Profile:   Travis [name]. is a 85 y.o. male with a hx of PVCs / ectopy, syncopal episodes, hypertension, mild aortic stenosis, G1DD, CKD, hyperlipidemia, L carotid stenosis (followed by PCP), hypothyroidism (synthroid), previous stroke / TIA February 2018, and remote history of smoking cigarettes who is being seen today for the evaluation of complete heart block at the request of Dr. Allena Katz.  History of Present Illness:   Travis [name] is an 85 year old male with PMH as above and including symptomatic PVCs (treated with diltiazem), past syncopal episodes, hypothyroid, and remote history of stroke February 2018.  The episode was reportedly sudden and not preceded by any symptoms. He was confused after the episode and it was witnessed by his son-in-law. He was taken to the emergency room and brain MRI showed small subacute lacunar infarct in the anterior left MCA territory  11/2013 Holter monitor showed PACs (close to 4000) and PVCs (1400).  In 07/2016, he reportedly had a stroke/TIA.  Echocardiogram later that year in May 2018 showed EF 55% to 60% with mild aortic stenosis.  Mean gradient was 7 mmHg.    In March 2019, he reportedly had a severe viral illness with a syncopal episode and presented to [facility] ED.  His labs at that time showed worsening chronic kidney disease and suspicion for dehydration.  He noted significant dizziness and was therefore seen by neurology with no acute findings.   Last seen in the office 11/11/2017.  At that time, he reported his symptoms of dizziness were almost completely resolved.  He denied orthostatic hypotension.  He denied chest pains or shortness of breath. EKG 11/11/17 showed NSR 72 bpm with  right bundle branch block. It was felt that his PVC symptoms were well controlled with diltiazem with plan for continued medical management.  Plan was also for repeat echocardiogram in 2 to 3 years for his mild aortic stenosis. PTA medications were continued including Plavix 75 mg daily (s/p stroke), Lotensin HCT 20-12.5 mg daily, Cardizem CD 120 mg qd, Cardura 4 mg qd, Proscar 5 mg qd, Synthroid 50 MCG daily before breakfast, KCl 20 mEq twice daily, and Zantac 75 mg PRN.     Of note, patient reported recent low blood pressure at [facility] clinic for which he had recently had stopped his losartan but otherwise no reported medication changes.  He also reported an 8 pound weight loss that was unintentional and due to following weight watchers.  On 08/08/2018, patient presented to [facility] ED with complaint of "feeling swimmy in the head."The patient stated that he started to feel poorly approximately 3 weeks ago at nighttime, during which time the daughter felt like he had " a seizure."  He stated this feeling progressed over the next few weeks with another episode of feeling poorly and "swimmy in the head" occurring 2 nights ago in the evening (2/9).  Per patient, he "fell out" at that time, but without LOC (LOC estimated at approximately 1 second or less) and episode not witnessed by daughter or son-in-law, both of whom were in the next room and came to his assistance right away.  Patient experienced this episode at rest (sitting on the couch) and denied any other symptoms, except stomach tightness but without nausea or vomiting.  In the ED, vitals significant for bradycardic rates in the low 30s with SBP 140s-80s and oxygen saturations 95-97% ORA. Patient asx and reported no additional symptoms since his episode on the couch; however, he had not tried to ambulate given his earlier episode. ED Labs significant for K 4.2, Cr 2.25, Troponin 0.06, WBC 8.1, Hgb 11.4. Pending TSH and repeat echo.  EKG significant for  complete heart block with with telemetry showing continued rates in the 30s.  In the ED, he received saline bolus and atropine. Given patient's complete heart block, cardiology was consulted. After discussing the case with EP, it was decided the patient would be transferred to [facility] for further evaluation and placement of pacemaker.  Patient reportedly last took his diltiazem yesterday (08/07/2018) at "sometime in the AM." Of note, takes Plavix as above. Made NPO.   Past Medical History:  Diagnosis Date  . Allergic rhinitis   . Anemia, unspecified   . Cardiac dysrhythmia, unspecified   . Carotid artery occlusion    mild  . Chronic kidney disease   . Colonic polyp   . Dizziness and giddiness   . Heel spur   . Hyperlipidemia   . Hypertension   . Hypothyroidism   . OA (osteoarthritis)   . Renal insufficiency   . Stroke (HCC) 07/2016   mini   . Tinea cruris     Past Surgical History:  Procedure Laterality Date  . APPENDECTOMY    . BACK SURGERY    . CIRCUMCISION    . GANGLION CYST EXCISION    . MOHS SURGERY       Home Medications:  Prior to Admission medications   Medication Sig Start Date End Date Taking? Authorizing Provider  celecoxib (CELEBREX) 200 MG capsule Take 200 mg by mouth daily.   Yes [provider]  clopidogrel (PLAVIX) 75 MG tablet Take 75 mg by mouth daily. 09/14/16  Yes [provider]  diclofenac sodium (VOLTAREN) 1 % GEL Apply 2 g topically 4 (four) times daily. 07/14/18  Yes [provider]  diltiazem (CARDIZEM CD) 120 MG 24 hr capsule Take 1 capsule (120 mg total) by mouth daily. 09/02/17  Yes Iran Ouch, MD  doxazosin (CARDURA) 4 MG tablet Take 4 mg by mouth daily.   Yes [provider]  famotidine (PEPCID) 40 MG tablet Take 40 mg by mouth daily.   Yes [provider]  finasteride (PROSCAR) 5 MG tablet Take 5 mg by mouth daily.   Yes [provider]  levothyroxine (SYNTHROID, LEVOTHROID) 50 MCG  tablet Take 50 mcg by mouth daily before breakfast.   Yes [provider]  polyethylene glycol (MIRALAX / GLYCOLAX) packet Take 17 g by mouth daily.   Yes [provider]  potassium chloride SA (K-DUR,KLOR-CON) 20 MEQ tablet Take 20 mEq by mouth 2 (two) times daily.   Yes [provider]  acetaminophen (TYLENOL) 500 MG tablet Take 500 mg by mouth daily as needed.    [provider]  fluticasone (FLONASE) 50 MCG/ACT nasal spray Place 1 spray into both nostrils as needed for allergies or rhinitis.    [provider]    Inpatient Medications: Scheduled Meds: . clopidogrel  75 mg Oral Daily  . enoxaparin (LOVENOX) injection  40 mg Subcutaneous Q24H  . famotidine  10 mg Oral Daily  . finasteride  5 mg Oral Daily  . [START ON 08/09/2018] levothyroxine  50 mcg Oral QAC breakfast  . polyethylene glycol  17 g  Oral Daily  . potassium chloride SA  20 mEq Oral BID   Continuous Infusions: . sodium chloride    . sodium chloride    . sodium chloride     PRN Meds: acetaminophen **OR** acetaminophen, fluticasone, ondansetron **OR** ondansetron (ZOFRAN) IV  Allergies:    Allergies  Allergen Reactions  . Morphine And Related   . Phenol     Other reaction(s): Unknown  . Tetracyclines & Related     Other reaction(s): UNKNOWN    Social History:   Social History   Socioeconomic History  . Marital status: Married    Spouse name: Not on file  . Number of children: Not on file  . Years of education: Not on file  . Highest education level: Not on file  Occupational History  . Not on file  Social Needs  . Financial resource strain: Not on file  . Food insecurity:    Worry: Not on file    Inability: Not on file  . Transportation needs:    Medical: Not on file    Non-medical: Not on file  Tobacco Use  . Smoking status: Former Smoker    Years: 4.00    Types: Cigarettes  . Smokeless tobacco: Never Used  Substance and Sexual Activity  . Alcohol  use: Yes    Comment: occasional  . Drug use: No  . Sexual activity: Not on file  Lifestyle  . Physical activity:    Days per week: Not on file    Minutes per session: Not on file  . Stress: Not on file  Relationships  . Social connections:    Talks on phone: Not on file    Gets together: Not on file    Attends religious service: Not on file    Active member of club or organization: Not on file    Attends meetings of clubs or organizations: Not on file    Relationship status: Not on file  . Intimate partner violence:    Fear of current or ex partner: Not on file    Emotionally abused: Not on file    Physically abused: Not on file    Forced sexual activity: Not on file  Other Topics Concern  . Not on file  Social History Narrative  . Not on file    Family History:    Family History  Problem Relation Age of Onset  . Hypertension Mother   . Dementia Mother   . Myasthenia gravis Father   . Lung cancer Sister      ROS:  Please see the history of present illness.  Review of Systems  Respiratory: Positive for shortness of breath.   Cardiovascular: Positive for palpitations. Negative for chest pain and leg swelling.  Gastrointestinal: Positive for abdominal pain. Negative for nausea and vomiting.  Musculoskeletal: Positive for falls.  Neurological: Positive for dizziness, loss of consciousness and weakness.       Not witnessed LOC, estimated less than 1 second "swimmy headed"  All other systems reviewed and are negative.   All other ROS reviewed and negative.     Physical Exam/Data:   Vitals:   08/08/18 1030 08/08/18 1110 08/08/18 1130 08/08/18 1131  BP: (!) 140/46 114/63  116/60  Pulse: (!) 31 (!) 31 (!) 39 (!) 32  Resp: 10 19 11 12   Temp:      SpO2: 96% 95% 96% 97%  Weight:      Height:       No intake or output  data in the 24 hours ending 08/08/18 1207 Filed Weights   08/08/18 0944  Weight: 94.8 kg   Body mass index is 29.15 kg/m.  General:  Elderly  gentleman in NAD HEENT: normal Neck: no JVD Vascular: Radial pulses 2+ bilaterally  Cardiac:  bradycardic; 1/6 systolic murmur Lungs:  clear to auscultation bilaterally, no wheezing, rhonchi or rales  Abd: soft, nontender, no hepatomegaly  Ext: no b/l LE edema Musculoskeletal:  No deformities Skin: warm and dry  Neuro:  no focal abnormalities noted Psych:  Normal affect   EKG:  The EKG was personally reviewed and demonstrates:  CHB Telemetry:  Telemetry was personally reviewed and demonstrates:  CHB, rates in 30s, ectopy  Relevant CV Studies:  Pending updated echo  TTE  10/26/2016 Study Conclusions - Left ventricle: The cavity size was normal. There was moderate   concentric hypertrophy. Systolic function was normal. The   estimated ejection fraction was in the range of 55% to 60%. Wall   motion was normal; there were no regional wall motion   abnormalities. Doppler parameters are consistent with abnormal   left ventricular relaxation (grade 1 diastolic dysfunction). - Aortic valve: There was very mild stenosis. There was trivial   regurgitation. Mean gradient (S): 7 mm Hg. Valve area (VTI): 2.54   cm^2.  12/06/2013 Holter 48h monitor under the EKG tab  Laboratory Data:  Chemistry Recent Labs  Lab 08/08/18 0949  NA 133*  K 4.2  CL 101  CO2 23  GLUCOSE 120*  BUN 48*  CREATININE 2.25*  CALCIUM 8.7*  GFRNONAA 25*  GFRAA 30*  ANIONGAP 9    No results for input(s): PROT, ALBUMIN, AST, ALT, ALKPHOS, BILITOT in the last 168 hours. Hematology Recent Labs  Lab 08/08/18 0949  WBC 8.1  RBC 3.72*  HGB 11.4*  HCT 35.1*  MCV 94.4  MCH 30.6  MCHC 32.5  RDW 13.0  PLT 153   Cardiac Enzymes Recent Labs  Lab 08/08/18 0949  TROPONINI 0.06*   No results for input(s): TROPIPOC in the last 168 hours.  BNPNo results for input(s): BNP, PROBNP in the last 168 hours.  DDimer No results for input(s): DDIMER in the last 168 hours.  Radiology/Studies:  Dg Chest Portable  1 View  Result Date: 08/08/2018 CLINICAL DATA:  85 year old who had a syncopal episode while sitting on a couch at home and presents with bradycardia (heart rate 30). EXAM: PORTABLE CHEST 1 VIEW COMPARISON:  08/31/2017. FINDINGS: External pacing pads. Cardiac silhouette mildly to moderately enlarged for the AP portable technique, unchanged. Stable chronic elevation of the RIGHT hemidiaphragm. Lungs clear. Bronchovascular markings normal. Pulmonary vascularity normal. No visible pleural effusions. No pneumothorax. IMPRESSION: Stable cardiomegaly. No acute cardiopulmonary disease. Electronically Signed   By: Hulan Saashomas  Lawrence M.D.   On: 08/08/2018 10:41    Assessment and Plan:   Complete Heart Block with h/o PVCs and syncopal episodes - H/o PVCs, [facility] / Dr. Kirke CorinArida. Holter as in HPI. PTA diltiazem - Patient remains asymptomatic with rates in the low 30s as above and due to CHB.  [facility]. He has been placed on temporary pacer pads and is n.p.o. in preparation for transfer.  Pacemaker implantation reviewed with both patient and daughter and both are agreeable to transfer.  Patient is hemodynamically stable.  - Patient reported last diltiazem taken yesterday 08/07/2018 at "sometime in the morning."  Of note, he is also on Plavix 75 mg in the  setting of remote stroke as above.  He is not on any anticoagulation.  Continue to hold AV nodal blocking agents in preparation for pacemaker implantation.   - Daily BMET and CBC recommended. Cr 2.25 (08/2017 Cr 1.81), K 4.2. Hgb 11.4 (08/2017 13.1). TSH labs pending update, given known hypothyroidism.  Echocardiogram pending with known G1DD, preserved EF, known AS.  CXR showed no acute disease. Continue to monitor on telemetry and monitor vitals.  Internal medicine has been notified of the transfer and EMTALA form completed.  [facility] has also been notified of the transfer and [facility] called.    Hypothyroidism - Updated TSH with 10/2017 TSH 5.574. On synthroid.  HTN - Labile BP/ hypotensive  CKD - Cr 2.25. Baseline Cr uncertain with last Cr 08/2017 at 1.81. Pending Mg update   HLD - No chest pain. Last LDL 104 on 10/2017. Consider recheck. Not on PTA statin.  Carotid Stenosis - Followed by PCP  H/o stroke (07/2016) - As in HPI. On Plavix 75mg  daily.  For questions or updates, please contact [facility] Please consult [email or web address] for contact info under     Signed, Lennon Alstrom, PA-C  08/08/2018 12:07 PM

## 2018-08-08 NOTE — Progress Notes (Signed)
   CHIEF COMPLAINT:   Chief Complaint  Patient presents with  . Loss of Consciousness    Subjective  Admitted to SD/ICU for CHB Had syncopal episode with dizziness EKG c/w CHB with low BP HR 33 transcutaneous pacer pads placed Cardiology consulted  Cardiology placing Temp pacemaker wire    Review of Systems:  Gen:  Denies  fever, sweats, chills weigh loss  HEENT: Denies blurred vision, double vision, ear pain, eye pain, hearing loss, nose bleeds, sore throat Cardiac:  No dizziness, chest pain or heaviness, chest tightness,edema, No JVD Resp:   No cough, -sputum production, -shortness of breath,-wheezing, -hemoptysis,  Gi: Denies swallowing difficulty, stomach pain, nausea or vomiting, diarrhea, constipation, bowel incontinence Gu:  Denies bladder incontinence, burning urine Ext:   Denies Joint pain, stiffness or swelling Skin: Denies  skin rash, easy bruising or bleeding or hives Endoc:  Denies polyuria, polydipsia , polyphagia or weight change Psych:   Denies depression, insomnia or hallucinations  Other:  All other systems negative  Objective   Examination:  General exam: Appears calm and comfortable  Respiratory system: Clear to auscultation. Respiratory effort normal. HEENT: [place]/AT, PERRLA, no thrush, no stridor. Cardiovascular system: S1 & S2 heard, RRR. No JVD, murmurs, rubs, gallops or clicks. No pedal edema. Gastrointestinal system: Abdomen is nondistended, soft and nontender. No organomegaly or masses felt. Normal bowel sounds heard. Central nervous system: Alert and oriented. No focal neurological deficits. Extremities: Symmetric 5 x 5 power. Skin: No rashes, lesions or ulcers Psychiatry: Judgement and insight appear normal. Mood & affect appropriate.   VITALS:  height is 5\' 11"  (1.803 m) and weight is 94.8 kg. His oral temperature is 98.1 F (36.7 C). His blood pressure is 116/60 and his pulse is 32 (abnormal). His respiration is 12 and oxygen saturation is  97%.   I personally reviewed Labs under Results section.  Radiology Reports Dg Chest Portable 1 View  Result Date: 08/08/2018 CLINICAL DATA:  85 year old who had a syncopal episode while sitting on a couch at home and presents with bradycardia (heart rate 30). EXAM: PORTABLE CHEST 1 VIEW COMPARISON:  08/31/2017. FINDINGS: External pacing pads. Cardiac silhouette mildly to moderately enlarged for the AP portable technique, unchanged. Stable chronic elevation of the RIGHT hemidiaphragm. Lungs clear. Bronchovascular markings normal. Pulmonary vascularity normal. No visible pleural effusions. No pneumothorax. IMPRESSION: Stable cardiomegaly. No acute cardiopulmonary disease. Electronically Signed   By: Hulan Saashomas  Lawrence M.D.   On: 08/08/2018 10:41    Assessment/Plan:  Acute CHB-plan for permanent pacemaker at some point At this time getting temp pacemaker Cont SD monitoring Follow cardiology recs [facility] and [facility]  Lucie LeatherKurian David Stevey Stapleton, M.D.  [facility]  Medical Director [facility] Medical Director [facility]

## 2018-08-08 NOTE — Brief Op Note (Signed)
BRIEF CARDIAC CATHETERIZATION NOTE  08/08/2018  4:01 PM  PATIENT:  Travis [name].  85 y.o. male  PRE-OPERATIVE DIAGNOSIS:  Complete heart block  POST-OPERATIVE DIAGNOSIS:  * No post-op diagnosis entered *  PROCEDURE:  Procedure(s): TEMPORARY PACEMAKER (N/A)  SURGEON:  Surgeon(s) and Role:    Yvonne Kendall, MD - Primary  FINDINGS: 1. Successful placement of 56F transvenous pacing wire via the right internal jugular vein.  RECOMMENDATIONS: 1. PCXR when patient arrives in ICU. 2. Awaiting transfer to [facility] for EP eval/PPM placement.

## 2018-08-08 NOTE — ED Provider Notes (Signed)
[facility] Emergency Department Provider Note    First MD Initiated Contact with Patient 08/08/18 [phone]     (approximate)  I have reviewed the triage vital signs and the nursing notes.   HISTORY  Chief Complaint Loss of Consciousness    HPI Travis EllisGlenn D Woodyard [name] is a 85 y.o. male below listed past medical history presents to the ER for evaluation of lightheadedness, weakness and fainting spells.  States he had another episode this morning.  Was seen by PCP yesterday found to be mildly hypotensive.  Does not recall what his heart rate was.  Is not having any chest pain or shortness of breath.  No headaches.  Does feel weak.  Started feeling weak several days ago.    Past Medical History:  Diagnosis Date  . Allergic rhinitis   . Anemia, unspecified   . Cardiac dysrhythmia, unspecified   . Carotid artery occlusion    mild  . Chronic kidney disease   . Colonic polyp   . Dizziness and giddiness   . Heel spur   . Hyperlipidemia   . Hypertension   . Hypothyroidism   . OA (osteoarthritis)   . Renal insufficiency   . Stroke (HCC) 07/2016   mini   . Tinea cruris    Family History  Problem Relation Age of Onset  . Hypertension Mother   . Dementia Mother   . Myasthenia gravis Father   . Lung cancer Sister    Past Surgical History:  Procedure Laterality Date  . APPENDECTOMY    . BACK SURGERY    . CIRCUMCISION    . GANGLION CYST EXCISION    . MOHS SURGERY     Patient Active Problem List   Diagnosis Date Noted  . Complete heart block (HCC) 08/08/2018  . Lumbar spondylosis 03/22/2017  . Chronic bilateral low back pain without sciatica 03/22/2017  . Facet arthropathy, lumbar 03/22/2017  . PVC (premature ventricular contraction) 11/23/2013  . Hypertension       Prior to Admission medications   Medication Sig Start Date End Date Taking? Authorizing Provider  celecoxib (CELEBREX) 200 MG capsule Take 200 mg by mouth daily.   Yes [provider]  clopidogrel (PLAVIX) 75 MG tablet Take 75 mg by mouth daily. 09/14/16  Yes [provider]  diclofenac sodium (VOLTAREN) 1 % GEL Apply 2 g topically 4 (four) times daily. 07/14/18  Yes [provider]  diltiazem (CARDIZEM CD) 120 MG 24 hr capsule Take 1 capsule (120 mg total) by mouth daily. 09/02/17  Yes [place] OuchArida, Muhammad A, MD  doxazosin (CARDURA) 4 MG tablet Take 4 mg by mouth daily.   Yes [provider]  famotidine (PEPCID) 40 MG tablet Take 40 mg by mouth daily.   Yes [provider]  finasteride (PROSCAR) 5 MG tablet Take 5 mg by mouth daily.   Yes [provider]  levothyroxine (SYNTHROID, LEVOTHROID) 50 MCG tablet Take 50 mcg by mouth daily before breakfast.   Yes [provider]  polyethylene glycol (MIRALAX / GLYCOLAX) packet Take 17 g by mouth daily.   Yes [provider]  potassium chloride SA (K-DUR,KLOR-CON) 20 MEQ tablet Take 20 mEq by mouth 2 (two) times daily.   Yes [provider]  acetaminophen (TYLENOL) 500 MG tablet Take 500 mg by mouth daily as needed.    [provider]  fluticasone (FLONASE) 50 MCG/ACT nasal spray Place 1 spray into both nostrils as needed for allergies or rhinitis.  [provider]    Allergies Morphine and related; Phenol; and Tetracyclines & related    Social History Social History   Tobacco Use  . Smoking status: Former Smoker    Years: 4.00    Types: Cigarettes  . Smokeless tobacco: Never Used  Substance Use Topics  . Alcohol use: Yes    Comment: occasional  . Drug use: No    Review of Systems Patient denies headaches, rhinorrhea, blurry vision, numbness, shortness of breath, chest pain, edema, cough, abdominal pain, nausea, vomiting, diarrhea, dysuria, fevers, rashes or hallucinations unless otherwise stated above in HPI. ____________________________________________   PHYSICAL EXAM:  VITAL SIGNS: Vitals:   08/08/18 1130  08/08/18 1131  BP:  116/60  Pulse: (!) 39 (!) 32  Resp: 11 12  Temp:    SpO2: 96% 97%    Constitutional: Alert and oriented.  Eyes: Conjunctivae are normal.  Head: Atraumatic. Nose: No congestion/rhinnorhea. Mouth/Throat: Mucous membranes are moist.   Neck: No stridor. Painless ROM.  Cardiovascular: bradycardic rate, regular rhythm. Grossly normal heart sounds.  Good peripheral circulation. Respiratory: Normal respiratory effort.  No retractions. Lungs CTAB. Gastrointestinal: Soft and nontender. No distention. No abdominal bruits. No CVA tenderness. Genitourinary:  Musculoskeletal: No lower extremity tenderness nor edema.  No joint effusions. Neurologic:  Normal speech and language. No gross focal neurologic deficits are appreciated. No facial droop Skin:  Skin is warm, dry and intact. No rash noted. Psychiatric: Mood and affect are normal. Speech and behavior are normal.  ____________________________________________   LABS (all labs ordered are listed, but only abnormal results are displayed)  Results for orders placed or performed during the hospital encounter of 08/08/18 (from the past 24 hour(s))  Basic metabolic panel     Status: Abnormal   Collection Time: 08/08/18  9:49 AM  Result Value Ref Range   Sodium 133 (L) 135 - 145 mmol/L   Potassium 4.2 3.5 - 5.1 mmol/L   Chloride 101 98 - 111 mmol/L   CO2 23 22 - 32 mmol/L   Glucose, Bld 120 (H) 70 - 99 mg/dL   BUN 48 (H) 8 - 23 mg/dL   Creatinine, Ser 1.61 (H) 0.61 - 1.24 mg/dL   Calcium 8.7 (L) 8.9 - 10.3 mg/dL   GFR calc non Af Amer 25 (L) >60 mL/min   GFR calc Af Amer 30 (L) >60 mL/min   Anion gap 9 5 - 15  CBC     Status: Abnormal   Collection Time: 08/08/18  9:49 AM  Result Value Ref Range   WBC 8.1 4.0 - 10.5 K/uL   RBC 3.72 (L) 4.22 - 5.81 MIL/uL   Hemoglobin 11.4 (L) 13.0 - 17.0 g/dL   HCT 09.6 (L) 04.5 - 40.9 %   MCV 94.4 80.0 - 100.0 fL   MCH 30.6 26.0 - 34.0 pg   MCHC 32.5 30.0 - 36.0 g/dL   RDW 81.1  91.4 - 78.2 %   Platelets 153 150 - 400 K/uL   nRBC 0.0 0.0 - 0.2 %  Troponin I - ONCE - STAT     Status: Abnormal   Collection Time: 08/08/18  9:49 AM  Result Value Ref Range   Troponin I 0.06 (HH) <0.03 ng/mL  TSH     Status: Abnormal   Collection Time: 08/08/18  9:49 AM  Result Value Ref Range   TSH 4.568 (H) 0.350 - 4.500 uIU/mL  Glucose, capillary     Status: Abnormal   Collection Time: 08/08/18 10:01 AM  Result Value Ref Range   Glucose-Capillary 104 (H) 70 - 99 mg/dL  Glucose, capillary     Status: Abnormal   Collection Time: 08/08/18 11:58 AM  Result Value Ref Range   Glucose-Capillary 107 (H) 70 - 99 mg/dL  MRSA PCR Screening     Status: None   Collection Time: 08/08/18 12:15 PM  Result Value Ref Range   MRSA by PCR NEGATIVE NEGATIVE  Magnesium     Status: None   Collection Time: 08/08/18 12:49 PM  Result Value Ref Range   Magnesium 2.1 1.7 - 2.4 mg/dL  Phosphorus     Status: None   Collection Time: 08/08/18 12:49 PM  Result Value Ref Range   Phosphorus 3.6 2.5 - 4.6 mg/dL  Troponin I - Now Then Q6H     Status: Abnormal   Collection Time: 08/08/18 12:49 PM  Result Value Ref Range   Troponin I 0.06 (HH) <0.03 ng/mL   ____________________________________________  EKG My review and personal interpretation at Time: 9:48   Indication: syncope  Rate: 30  Rhythm: complete heart block Axis: left Other: lbbb, abnml ekg ____________________________________________  RADIOLOGY  I personally reviewed all radiographic images ordered to evaluate for the above acute complaints and reviewed radiology reports and findings.  These findings were personally discussed with the patient.  Please see medical record for radiology report.  ____________________________________________   PROCEDURES  Procedure(s) performed:  .Critical Care Performed by: Willy Eddyobinson, Sharicka Pogorzelski, MD Authorized by: Willy Eddyobinson, Lavera Vandermeer, MD   Critical care provider statement:    Critical care time (minutes):   35   Critical care time was exclusive of:  Separately billable procedures and treating other patients   Critical care was necessary to treat or prevent imminent or life-threatening deterioration of the following conditions:  Cardiac failure   Critical care was time spent personally by me on the following activities:  Development of treatment plan with patient or surrogate, discussions with consultants, evaluation of patient's response to treatment, examination of patient, obtaining history from patient or surrogate, ordering and performing treatments and interventions, ordering and review of laboratory studies, ordering and review of radiographic studies, pulse oximetry, re-evaluation of patient's condition and review of old charts      Critical Care performed: yes ____________________________________________   INITIAL IMPRESSION / ASSESSMENT AND PLAN / ED COURSE  Pertinent labs & imaging results that were available during my care of the patient were reviewed by me and considered in my medical decision making (see chart for details).   DDX: Complete heart block, dysrhythmia, medication effect, hyperkalemia, AKI, ACS  Travis EllisGlenn D Perkey [name] is a 85 y.o. who presents to the ED with symptoms as described above.  Patient profoundly bradycardic with EKG evidence showing complete heart block.  Patient placed immediately on pacer pads and telemetry monitoring.  Blood will be sent for the above differential.  Will consult cardiology.  Clinical Course as of Aug 08 1413  Tue Aug 08, 2018  1019 Patient remains asymptomatic while sitting in bed.  There is no response to atropine.  Blood pressure fortunately is stable.   [PR]  1043 Troponin mildly elevated to 0.06.  Does have evidence of AKI but fortunately his potassium is normal.  Will give gentle IV hydration.  He is currently pain-free so doubt ACS.  Will consult with cardiology.  Patient still on pacer pads and hemodynamic monitoring.  Will require  admission the hospital further medical management.   [PR]    Clinical Course User Index [PR] Roxan Hockeyobinson,  Luisa Hart, MD     As part of my medical decision making, I reviewed the following data within the electronic MEDICAL RECORD NUMBER Nursing notes reviewed and incorporated, Labs reviewed, notes from prior ED visits and [place] Controlled Substance Database   ____________________________________________   FINAL CLINICAL IMPRESSION(S) / ED DIAGNOSES  Final diagnoses:  Complete heart block (HCC)  AKI (acute kidney injury) (HCC)      NEW MEDICATIONS STARTED DURING THIS VISIT:  Current Discharge Medication List       Note:  This document was prepared using Dragon voice recognition software and may include unintentional dictation errors.    Willy Eddy, MD 08/08/18 1415

## 2018-08-08 NOTE — ED Notes (Signed)
Date and time results received: 08/08/18 10:38 AM  Test: troponin I Critical Value: 0.06  Name of Provider Notified: Dr. Roxan Hockeyobinson  Orders Received? Or Actions Taken?: no new orders at this time

## 2018-08-08 NOTE — H&P (Signed)
Cardiology Admission History and Physical:   Patient ID: Travis EllisGlenn [name] Pallett [name] MRN: [id number]; DOB: 09-14-1929   Admission date: (Not on file)  Primary Care Provider: Marguarite ArbourSparks, Jeffrey D, MD Primary Cardiologist: Dr. Kirke [name], [facility] Primary Electrophysiologist:  None   Chief Complaint:  Complete Heart Block  Patient Profile:   Travis EllisGlenn [name] Rybarczyk [name] is a 85 y.o. male with with a hx of PVCs / ectopy, syncopal episodes, hypertension, mild aortic stenosis, G1DD, CKD, hyperlipidemia, L carotid stenosis (followed by PCP), hypothyroidism (synthroid), previous stroke / TIA February 2018, and remote history of smoking cigarettes who is was [facility] to [facility] for pacemaker evaluation and implant in setting of complete heart block s/p temporary pacer.  History of Present Illness:   Mr. Travis [name]  is an 85 year old male with PMH as above and including symptomatic PVCs (treated with diltiazem), past syncopal episodes, hypothyroid, and remote history of stroke February 2018.  The episode was reportedly sudden and not preceded by any symptoms. He was confused after the episode and it was witnessed by his son-in-law. He was taken to the emergency room and brain MRI showed small subacute lacunar infarct in the anterior left MCA territory  11/2013 Holter monitor showed PACs (close to 4000) and PVCs (1400).  In 07/2016, he reportedly had a stroke/TIA.  Echocardiogram later that year in May 2018 showed EF 55% to 60% with mild aortic stenosis.  Mean gradient was 7 mmHg.    In March 2019, he reportedly had a severe viral illness with a syncopal episode and [facility] ED.  His labs at that time showed worsening chronic kidney disease and suspicion for dehydration.  He noted significant dizziness and was therefore seen by neurology with no acute findings.   Last seen in the office 11/11/2017.  At that time, he reported his symptoms of dizziness were almost completely resolved.  He denied  orthostatic hypotension.  He denied chest pains or shortness of breath. EKG 11/11/17 showed NSR 72 bpm with right bundle branch block. It was felt that his PVC symptoms were well controlled with diltiazem with plan for continued medical management.  Plan was also for repeat echocardiogram in 2 to 3 years for his mild aortic stenosis. PTA medications were continued including Plavix 75 mg daily (s/p stroke), Lotensin HCT 20-12.5 mg daily, Cardizem CD 120 mg qd, Cardura 4 mg qd, Proscar 5 mg qd, Synthroid 50 MCG daily before breakfast, KCl 20 mEq twice daily, and Zantac 75 mg PRN.     Of note, patient reported recent [facility] blood [facility] for which he had recently had stopped his losartan but otherwise no reported medication changes.  He also reported an 8 pound weight loss that was unintentional and due to following weight watchers.  On 08/08/2018, patient [facility] ED with complaint of "feeling swimmy in the head."The patient stated that he started to feel poorly approximately 3 weeks ago at nighttime, during which time the daughter felt like he had " a seizure."  He stated this feeling progressed over the next few weeks with another episode of feeling poorly and "swimmy in the head" occurring 2 nights ago in the evening (2/9).  Per patient, he "fell out" at that time, but without LOC (LOC estimated at approximately 1 second or less) and episode not witnessed by daughter or son-in-law, both of whom were in the next room and came to his assistance right away.  Patient experienced this episode at rest (sitting  on the couch) and denied any other symptoms, except stomach tightness but without nausea or vomiting.  In the ED, vitals significant for bradycardic rates in the low 30s with SBP 140s-80s and oxygen saturations 95-97% ORA. Patient asx and reported no additional symptoms since his episode on the couch; however, he had not tried to ambulate given his earlier episode. ED Labs  significant for K 4.2, Cr 2.25, Troponin 0.06, WBC 8.1, Hgb 11.4. Pending TSH and repeat echo.  EKG significant for complete heart block with with telemetry showing continued rates in the 30s.  In the ED, he received saline bolus and atropine. Given patient's complete heart block, cardiology was consulted. After discussing the case with EP, it was decided the patient would be transferred to [facility] for further evaluation and placement of pacemaker.    As above, patient presented with symptomatic complete heart block and though hemodynamically stable while awaiting transfer, intermittent dizziness with accompanying prolonged ventricular standstill was found to be concerning. The decision was made to proceed with temporary transvenous pacemaker wire.  The procedure as well as its risks and benefits was discussed with the patient.  He was agreeable to proceeding and is to be transferred today 2/11 to [facility].  Past Medical History:  Diagnosis Date  . Allergic rhinitis   . Anemia, unspecified   . Cardiac dysrhythmia, unspecified   . Carotid artery occlusion    mild  . Chronic kidney disease   . Colonic polyp   . Dizziness and giddiness   . Heel spur   . Hyperlipidemia   . Hypertension   . Hypothyroidism   . OA (osteoarthritis)   . Renal insufficiency   . Stroke (HCC) 07/2016   mini   . Tinea cruris     Past Surgical History:  Procedure Laterality Date  . APPENDECTOMY    . BACK SURGERY    . CIRCUMCISION    . GANGLION CYST EXCISION    . MOHS SURGERY       Medications Prior to Admission: Prior to Admission medications   Medication Sig Start Date End Date Taking? Authorizing Provider  acetaminophen (TYLENOL) 500 MG tablet Take 500 mg by mouth daily as needed.    [provider]  celecoxib (CELEBREX) 200 MG capsule Take 200 mg by mouth daily.    [provider]  diclofenac sodium (VOLTAREN) 1 % GEL Apply 2 g topically 4 (four) times daily. 07/14/18   [provider]  doxazosin (CARDURA) 4 MG tablet Take 4 mg by mouth daily.    [provider]  famotidine (PEPCID) 40 MG tablet Take 40 mg by mouth daily.    [provider]  finasteride (PROSCAR) 5 MG tablet Take 5 mg by mouth daily.    [provider]  fluticasone (FLONASE) 50 MCG/ACT nasal spray Place 1 spray into both nostrils as needed for allergies or rhinitis.    [provider]  levothyroxine (SYNTHROID, LEVOTHROID) 50 MCG tablet Take 50 mcg by mouth daily before breakfast.    [provider]  polyethylene glycol (MIRALAX / GLYCOLAX) packet Take 17 g by mouth daily.    [provider]  potassium chloride SA (K-DUR,KLOR-CON) 20 MEQ tablet Take 20 mEq by mouth 2 (two) times daily.    [provider]     Allergies:    Allergies  Allergen Reactions  . Morphine And Related   . Phenol     Other reaction(s): Unknown  . Tetracyclines & Related  Other reaction(s): UNKNOWN    Social History:   Social History   Socioeconomic History  . Marital status: Married    Spouse name: Not on file  . Number of children: Not on file  . Years of education: Not on file  . Highest education level: Not on file  Occupational History  . Not on file  Social Needs  . Financial resource strain: Not on file  . Food insecurity:    Worry: Not on file    Inability: Not on file  . Transportation needs:    Medical: Not on file    Non-medical: Not on file  Tobacco Use  . Smoking status: Former Smoker    Years: 4.00    Types: Cigarettes  . Smokeless tobacco: Never Used  Substance and Sexual Activity  . Alcohol use: Yes    Comment: occasional  . Drug use: No  . Sexual activity: Not on file  Lifestyle  . Physical activity:    Days per week: Not on file    Minutes per session: Not on file  . Stress: Not on file  Relationships  . Social connections:    Talks on phone: Not on file    Gets together: Not on file    Attends religious service: Not  on file    Active member of club or organization: Not on file    Attends meetings of clubs or organizations: Not on file    Relationship status: Not on file  . Intimate partner violence:    Fear of current or ex partner: Not on file    Emotionally abused: Not on file    Physically abused: Not on file    Forced sexual activity: Not on file  Other Topics Concern  . Not on file  Social History Narrative  . Not on file    Family History:   The patient's family history includes Dementia in his mother; Hypertension in his mother; Lung cancer in his sister; Myasthenia gravis in his father.    ROS:  Please see the history of present illness.  Review of Systems  Constitutional: Positive for malaise/fatigue.  Respiratory: Positive for shortness of breath.   Cardiovascular: Positive for palpitations. Negative for chest pain.  Gastrointestinal: Positive for abdominal pain.  Musculoskeletal: Positive for falls.       Fall not witnessed. LOC less than a second per patient  Neurological: Positive for dizziness, loss of consciousness and weakness.       LOC less than a second, not witnessed  All other systems reviewed and are negative.  All other ROS reviewed and negative.     Physical Exam/Data:  There were no vitals filed for this visit. No intake or output data in the 24 hours ending 08/08/18 1814 There were no vitals filed for this visit. There is no height or weight on file to calculate BMI.  General:  Well nourished, well developed, in no acute distress HEENT: normal Neck: no JVD Endocrine:  No thryomegaly Vascular: radial pulses 2+ bilaterally, pedal 2+ bilaterally  Cardiac:  Bradycardic, regular; 2/6 systolic murmur Lungs:  clear to auscultation bilaterally, no wheezing, rhonchi or rales  Abd: soft, nontender, no hepatomegaly  Ext: no b/l LE edema Musculoskeletal:  No deformities, BUE and BLE strength normal and equal Skin: warm and dry  Neuro: no focal abnormalities  noted Psych:  Normal affect    EKG:  EKG shows normal sinus rhythm with complete heart block and ventricular escape.    Relevant  CV Studies: Pending updated echo  TTE  10/26/2016 Study Conclusions - Left ventricle: The cavity size was normal. There was moderate concentric hypertrophy. Systolic function was normal. The estimated ejection fraction was in the range of 55% to 60%. Wall motion was normal; there were no regional wall motion abnormalities. Doppler parameters are consistent with abnormal left ventricular relaxation (grade 1 diastolic dysfunction). - Aortic valve: There was very mild stenosis. There was trivial regurgitation. Mean gradient (S): 7 mm Hg. Valve area (VTI): 2.54 cm^2.  12/06/2013 Holter 48h monitor under the EKG tab   Laboratory Data:  Chemistry Recent Labs  Lab 08/08/18 0949  NA 133*  K 4.2  CL 101  CO2 23  GLUCOSE 120*  BUN 48*  CREATININE 2.25*  CALCIUM 8.7*  GFRNONAA 25*  GFRAA 30*  ANIONGAP 9    No results for input(s): PROT, ALBUMIN, AST, ALT, ALKPHOS, BILITOT in the last 168 hours. Hematology Recent Labs  Lab 08/08/18 0949  WBC 8.1  RBC 3.72*  HGB 11.4*  HCT 35.1*  MCV 94.4  MCH 30.6  MCHC 32.5  RDW 13.0  PLT 153   Cardiac Enzymes Recent Labs  Lab 08/08/18 0949 08/08/18 1249  TROPONINI 0.06* 0.06*   No results for input(s): TROPIPOC in the last 168 hours.  BNPNo results for input(s): BNP, PROBNP in the last 168 hours.  DDimer No results for input(s): DDIMER in the last 168 hours.  Radiology/Studies:  Dg Chest Portable 1 View  Result Date: 08/08/2018 CLINICAL DATA:  85 year old who had a syncopal episode while sitting on a couch at home and presents with bradycardia (heart rate 30). EXAM: PORTABLE CHEST 1 VIEW COMPARISON:  08/31/2017. FINDINGS: External pacing pads. Cardiac silhouette mildly to moderately enlarged for the AP portable technique, unchanged. Stable chronic elevation of the RIGHT  hemidiaphragm. Lungs clear. Bronchovascular markings normal. Pulmonary vascularity normal. No visible pleural effusions. No pneumothorax. IMPRESSION: Stable cardiomegaly. No acute cardiopulmonary disease. Electronically Signed   By: Hulan Saashomas  Lawrence M.[name].   On: 08/08/2018 10:41    Assessment and Plan:   Complete Heart Block with h/o PVCs and syncopal episodes - H/o PVCs, [facility] Dr. Kirke [name]. Pending transfer for PPM. - While pending transfer, patient was hemodynamically stable but intermittent dizziness and prolonged ventricular standstill of 8 seconds in ICU; therefore, the decision was made for temporary transvenous pacemaker wire before [facility]  for formal permanent pacemaker evaluation and likely permanent pacemaker implantation.  Risks and benefits of the procedure reviewed and patient was consented and agreeable to proceeding.  Case discussed with Dr. Graciela HusbandsKlein (EP). Temporary pacemaker wire inserted without complication. - N.p.o. in preparation for transfer.  - Last diltiazem 120mg  daily taken yesterday 08/07/2018 at "sometime in the morning."  Continue to hold. Of note, he is also on Plavix 75 mg in the setting of remote stroke as above. Continue to hold AV nodal blocking agents in preparation for pacemaker implantation.   - Daily BMET and CBC recommended. Cr 2.25 (08/2017 Cr 1.81), K 4.2. Hgb 11.4 (08/2017 13.1). Troponin 0.06x2. WBC 8.1. NA 133.  Echocardiogram pending with known G1DD, preserved EF, known AS.  CXR showed no acute disease. Internal medicine has been notified of the transfer and EMTALA form completed.  [facility] has also been notified of the transfer and [facility] called.   Elevated Troponin - Ischemic work-up deferred as the patient was without symptoms of myocardial ischemia and minimal troponin elevation was nonspecific and likely represents supply-demand mismatch  in the setting of complete heart block and chronic kidney disease.     Hypothyroidism - 10/2017 TSH 5.574. Pending update. On synthroid.  HTN - Labile BP  CKD - Cr 2.25. Baseline Cr uncertain with last Cr 08/2017 at 1.81. Pending Mg update   HLD - No chest pain. Last LDL 104 on 10/2017. Consider recheck. Not on PTA statin.    Carotid Stenosis - Followed by PCP  H/o stroke (07/2016) - As in HPI. On Plavix 75mg  daily.    Severity of Illness: The appropriate patient status for this patient is INPATIENT. Inpatient status is judged to be reasonable and necessary in order to provide the required intensity of service to ensure the patient's safety. The patient's presenting symptoms, physical exam findings, and initial radiographic and laboratory data in the context of their chronic comorbidities is felt to place them at high risk for further clinical deterioration. Furthermore, it is not anticipated that the patient will be medically stable for discharge from the hospital within 2 midnights of admission. The following factors support the patient status of inpatient.   " The patient's presenting symptoms include as above . " The worrisome physical exam findings include as above. " The initial radiographic and laboratory data are worrisome because of as above. " The chronic co-morbidities include as above.   * I certify that at the point of admission it is my clinical judgment that the patient will require inpatient hospital care spanning beyond 2 midnights from the point of admission due to high intensity of service, high risk for further deterioration and high frequency of surveillance required.*    For questions or updates, please contact [facility] Please consult [email or web address] for contact info under        Signed, Lennon Alstrom, PA-C  08/08/2018 6:14 PM

## 2018-08-08 NOTE — Progress Notes (Signed)
Spoke with Dr End regarding patient not receiving his Plavix. MD aware states to wait and give once he comes back to ICU.

## 2018-08-08 NOTE — Progress Notes (Signed)
PHARMACIST - PHYSICIAN COMMUNICATION  CONCERNING:  Enoxaparin (Lovenox) for DVT Prophylaxis    RECOMMENDATION: Patient was prescribed enoxaprin 40mg  q24 hours for VTE prophylaxis.   Filed Weights   08/08/18 0944  Weight: 209 lb (94.8 kg)    Body mass index is 29.15 kg/m.  Estimated Creatinine Clearance: 27.7 mL/min (A) (by C-G formula based on SCr of 2.25 mg/dL (H)).   Patient is candidate for enoxaparin 30mg  every 24 hours based on CrCl <42ml/min   DESCRIPTION: Pharmacy has adjusted enoxaparin dose per [facility] policy.  Patient is now receiving enoxaparin 30mg  every 24 hours.  Gardner Candle, PharmD, BCPS Clinical Pharmacist 08/08/2018 1:43 PM

## 2018-08-08 NOTE — Progress Notes (Addendum)
Family Meeting Note  Advance Directiveyes Today a meeting took place with the patient and daughter patient presents with syncope and dizziness along with acute on chronic renal failure. He was hypotensive found to have heart rate in the 30s. Being admitted for complete heart block. Has multiple other comorbidities. He is otherwise functional at home. Daughter in the ER room. Code status discussed patient would want to be full code. Time spent 16 minutes Enedina Finner, MD

## 2018-08-08 NOTE — Progress Notes (Signed)
Spoke with Dr End regarding external/temporary pacemaker. Per MD due to patient being asymptomatic at this time, it is not needed. He did state that he has talked with his fellow Cardiologist at [facility] and they are in agreement. Transfering patient due to Dr. Okey Dupre does not place permanent pacemakers.

## 2018-08-08 NOTE — ED Triage Notes (Signed)
Pt arrived with via ems from after syncopal episode while pt was on the couch. Pt arrives with a HR of 30. Pt arrives with no complaints of pain but reports dizziness for "a few days." Pt saw his PCP yesterday with no acute finding. Syncopal episode was witnessed by son in law who reports episode "lasted a few seconds."

## 2018-08-08 NOTE — Progress Notes (Signed)
Patient to [facility] accompanied by ICU RN, brought directly into cath lab for temp pacemaker placement. Patient to return directly to ICU post-procedure.

## 2018-08-08 NOTE — Progress Notes (Signed)
Pt arrived to [facility] @ 1810. MD on call for cards paged for orders.

## 2018-08-08 NOTE — H&P (Signed)
[facility] - [place] at [facility]   PATIENT NAME: Travis [name]    MR#:  [id number]  DATE OF BIRTH:  [birth year]  DATE OF ADMISSION:  08/08/2018  PRIMARY CARE PHYSICIAN: Marguarite Arbour, MD   REQUESTING/REFERRING PHYSICIAN: Dr. Roxan Hockey  CHIEF COMPLAINT:   Dizziness and syncopal episode witnessed by son-in-law at home today HISTORY OF PRESENT ILLNESS:  Travis [name]  is a 85 y.o. male with a known history of PVC/PACs, history of dizziness chronic, chronic kidney disease stage II, hypertension, hypothyroidism comes to the emergency room after he had a syncopal episode and felt extremely dizzy this morning that was witnessed by his son-in-law. Patient was feeling dizzy yesterday went to see primary care physician his labs showed elevated creatinine of 2.2. He has baseline CKD stage II creatinine around 1.4-1.5. In the ER patient was found to have heart rate of 3231. EKG was suspicious for complete heart block/third-degree block. Blood pressure was 89/70. Patient is sitting up at present asymptomatic.  Cardiology Dr. end is here to see patient. Will admit to ICU for further evaluation of management.  PAST MEDICAL HISTORY:   Past Medical History:  Diagnosis Date  . Allergic rhinitis   . Anemia, unspecified   . Cardiac dysrhythmia, unspecified   . Carotid artery occlusion    mild  . Chronic kidney disease   . Colonic polyp   . Dizziness and giddiness   . Heel spur   . Hyperlipidemia   . Hypertension   . Hypothyroidism   . OA (osteoarthritis)   . Renal insufficiency   . Stroke (HCC) 07/2016   mini   . Tinea cruris     PAST SURGICAL HISTOIRY:   Past Surgical History:  Procedure Laterality Date  . APPENDECTOMY    . BACK SURGERY    . CIRCUMCISION    . GANGLION CYST EXCISION    . MOHS SURGERY      SOCIAL HISTORY:   Social History   Tobacco Use  . Smoking status: Former Smoker    Years: 4.00    Types: Cigarettes  . Smokeless tobacco:  Never Used  Substance Use Topics  . Alcohol use: Yes    Comment: occasional    FAMILY HISTORY:   Family History  Problem Relation Age of Onset  . Hypertension Mother   . Dementia Mother   . Myasthenia gravis Father   . Lung cancer Sister     DRUG ALLERGIES:   Allergies  Allergen Reactions  . Morphine And Related   . Phenol     Other reaction(s): Unknown  . Tetracyclines & Related     Other reaction(s): UNKNOWN    REVIEW OF SYSTEMS:  Review of Systems  Constitutional: Negative for chills, fever and weight loss.  HENT: Negative for ear discharge, ear pain and nosebleeds.   Eyes: Negative for blurred vision, pain and discharge.  Respiratory: Negative for sputum production, shortness of breath, wheezing and stridor.   Cardiovascular: Negative for chest pain, palpitations, orthopnea and PND.  Gastrointestinal: Negative for abdominal pain, diarrhea, nausea and vomiting.  Genitourinary: Negative for frequency and urgency.  Musculoskeletal: Negative for back pain and joint pain.  Neurological: Positive for dizziness and weakness. Negative for sensory change, speech change and focal weakness.  Psychiatric/Behavioral: Negative for depression and hallucinations. The patient is not nervous/anxious.      MEDICATIONS AT HOME:   Prior to Admission medications   Medication Sig Start Date End Date Taking? Authorizing Provider  celecoxib (CELEBREX)  200 MG capsule Take 200 mg by mouth daily.   Yes [provider]  clopidogrel (PLAVIX) 75 MG tablet Take 75 mg by mouth daily. 09/14/16  Yes [provider]  diltiazem (CARDIZEM CD) 120 MG 24 hr capsule Take 1 capsule (120 mg total) by mouth daily. 09/02/17  Yes Iran OuchArida, Muhammad A, MD  doxazosin (CARDURA) 4 MG tablet Take 4 mg by mouth daily.   Yes [provider]  finasteride (PROSCAR) 5 MG tablet Take 5 mg by mouth daily.   Yes [provider]  levothyroxine (SYNTHROID, LEVOTHROID) 50 MCG tablet Take 50 mcg  by mouth daily before breakfast.   Yes [provider]  polyethylene glycol (MIRALAX / GLYCOLAX) packet Take 17 g by mouth daily.   Yes [provider]  potassium chloride SA (K-DUR,KLOR-CON) 20 MEQ tablet Take 20 mEq by mouth 2 (two) times daily.   Yes [provider]  acetaminophen (TYLENOL) 500 MG tablet Take 500 mg by mouth daily as needed.    [provider]  fluticasone (FLONASE) 50 MCG/ACT nasal spray Place 1 spray into both nostrils as needed for allergies or rhinitis.    [provider]      VITAL SIGNS:  Blood pressure 116/60, pulse (!) 32, temperature 99.1 F (37.3 C), resp. rate 12, height 5\' 11"  (1.803 m), weight 94.8 kg, SpO2 97 %.  PHYSICAL EXAMINATION:  GENERAL:  85 y.o.-year-old patient lying in the bed with no acute distress.  EYES: Pupils equal, round, reactive to light and accommodation. No scleral icterus. Extraocular muscles intact.  HEENT: Head atraumatic, normocephalic. Oropharynx and nasopharynx clear.  NECK:  Supple, no jugular venous distention. No thyroid enlargement, no tenderness.  LUNGS: Normal breath sounds bilaterally, no wheezing, rales,rhonchi or crepitation. No use of accessory muscles of respiration.  CARDIOVASCULAR: S1, S2 normal. No murmurs, rubs, or gallops.  ABDOMEN: Soft, nontender, nondistended. Bowel sounds present. No organomegaly or mass.  EXTREMITIES: No pedal edema, cyanosis, or clubbing.  NEUROLOGIC: Cranial nerves II through XII are intact. Muscle strength 5/5 in all extremities. Sensation intact. Gait not checked.  PSYCHIATRIC: The patient is alert and oriented x 3.  SKIN: No obvious rash, lesion, or ulcer.   LABORATORY PANEL:   CBC Recent Labs  Lab 08/08/18 0949  WBC 8.1  HGB 11.4*  HCT 35.1*  PLT 153   ------------------------------------------------------------------------------------------------------------------  Chemistries  Recent Labs  Lab 08/08/18 0949  NA 133*  K 4.2   CL 101  CO2 23  GLUCOSE 120*  BUN 48*  CREATININE 2.25*  CALCIUM 8.7*   ------------------------------------------------------------------------------------------------------------------  Cardiac Enzymes Recent Labs  Lab 08/08/18 0949  TROPONINI 0.06*   ------------------------------------------------------------------------------------------------------------------  RADIOLOGY:  Dg Chest Portable 1 View  Result Date: 08/08/2018 CLINICAL DATA:  85 year old who had a syncopal episode while sitting on a couch at home and presents with bradycardia (heart rate 30). EXAM: PORTABLE CHEST 1 VIEW COMPARISON:  08/31/2017. FINDINGS: External pacing pads. Cardiac silhouette mildly to moderately enlarged for the AP portable technique, unchanged. Stable chronic elevation of the RIGHT hemidiaphragm. Lungs clear. Bronchovascular markings normal. Pulmonary vascularity normal. No visible pleural effusions. No pneumothorax. IMPRESSION: Stable cardiomegaly. No acute cardiopulmonary disease. Electronically Signed   By: Hulan Saashomas  Lawrence M.D.   On: 08/08/2018 10:41    EKG:   A V dissociation IMPRESSION AND PLAN:  Hillary BowGlenn Schrieber  is a [age] y.o. male with a known history of PVC/PACs, history of dizziness chronic, chronic kidney disease stage II, hypertension, hypothyroidism comes to the emergency  room after he had a syncopal episode and felt extremely dizzy this morning that was witnessed by his son-in-law.  1. Syncope/dizziness suspected due to complete heart block/acute on chronic renal failure and hypertension -admit to ICU. Discussed with Dr. Jayme Cloud. -Cardiology consultation with Dr. Okey Dupre -hold Cardizem -IV fluids -check magnesium and phosphorous  2. Elevated troponin without chest pain in the setting of acute on chronic renal failure appears demand ischemia with hypotension and AV dissociation/heart block -cycle cardiac enzymes times three -continue aspirin Plavix  3. Hypotension/acute on chronic  renal failure -IV fluids -hold nephrotoxins -monitor input output -baseline creatinine 1.5 -creatinine 2.22--- 2.25  4. Hyperlipidemia continue statins  5. Hypothyroidism continue Synthroid  Discussed with daughter   all the records are reviewed and case discussed with ED provider.   CODE STATUS: full  TOTAL TIME TAKING CARE OF THIS PATIENT: *50* minutes.    Enedina Finner M.D on 08/08/2018 at 11:36 AM  Between 7am to 6pm - Pager - [phone]  After 6pm go to [email or web address] - password [email or web address] [facility]  [facility]  Office  [phone]  CC: Primary care physician; Marguarite Arbour, MD

## 2018-08-08 NOTE — Progress Notes (Signed)
Paged Dr End regarding patients heart rate. Patient is asymptomatic. No orders for temporary pacemaker? Awaiting for a return phone call.

## 2018-08-08 NOTE — Progress Notes (Signed)
Patient sat up on the side of the bed to use urinal. Once back in bed and laid flat to be pulled up had 2 episodes of dizziness. Vitals did not fluctuate. Dizziness lasted for a few seconds per patient. Dr End notified. He will be coming up to evaluate patient.

## 2018-08-08 NOTE — Interval H&P Note (Signed)
History and Physical Interval Note:  08/08/2018 2:51 PM  Travis [name].  has presented today for cardiac catheterization, with the diagnosis of complete heart block. The various methods of treatment have been discussed with the patient and family. After consideration of risks, benefits and other options for treatment, the patient has consented to  Procedure(s): TEMPORARY PACEMAKER (N/A) as a surgical intervention .  The patient's history has been reviewed, patient examined, no change in status, stable for surgery.  I have reviewed the patient's chart and labs.  Questions were answered to the patient's satisfaction.     Debara Kamphuis

## 2018-08-08 NOTE — Discharge Summary (Signed)
[facility] -  at [facility]   PATIENT NAME: Travis [name]    MR#:  [id number]  DATE OF BIRTH:  [birth year]  DATE OF ADMISSION:  08/08/2018 ADMITTING PHYSICIAN: Enedina Finner, MD  DATE OF DISCHARGE: 08/08/2018  PRIMARY CARE PHYSICIAN: Marguarite Arbour, MD    ADMISSION DIAGNOSIS:  Complete heart block (HCC) [I44.2] AKI (acute kidney injury) (HCC) [N17.9]  DISCHARGE DIAGNOSIS:  Complete heart block--s/p tranvenous pacing wire via right Internal jugular vein by Dr End  SECONDARY DIAGNOSIS:   Past Medical History:  Diagnosis Date  . Allergic rhinitis   . Anemia, unspecified   . Cardiac dysrhythmia, unspecified   . Carotid artery occlusion    mild  . Chronic kidney disease   . Colonic polyp   . Dizziness and giddiness   . Heel spur   . Hyperlipidemia   . Hypertension   . Hypothyroidism   . OA (osteoarthritis)   . Renal insufficiency   . Stroke (HCC) 07/2016   mini   . Tinea cruris     HOSPITAL COURSE:   Travis [name]  is a 85 y.o. male with a known history of PVC/PACs, history of dizziness chronic, chronic kidney disease stage II, hypertension, hypothyroidism comes to the emergency room after he had a syncopal episode and felt extremely dizzy this morning that was witnessed by his son-in-law.  1. Syncope/dizziness suspected due to complete heart block status post transvenous pacing via right Internal jugular vein - Discussed with Dr. Jayme Cloud. -Cardiology consultation with Dr. Okey Dupre appreciated. It is being transferred to [facility]: for pacemaker placement. -hold Cardizem -IV fluids -check magnesium and phosphorous  2. Elevated troponin without chest pain in the setting of acute on chronic renal failure appears demand ischemia with hypotension and AV dissociation/heart block -cycle cardiac enzymes times three -on aspirin Plavix (hold since pt getting PM)  3. Hypotension/acute on chronic renal failure -IV fluids -hold nephrotoxins -monitor  input output -baseline creatinine 1.5 -creatinine 2.22--- 2.25  4. Hyperlipidemia continue statins  5. Hypothyroidism continue Synthroid  Patient has been accepted by Dr. Raul Del at [facility]. CONSULTS OBTAINED:  Treatment Team:  Yvonne Kendall, MD  DRUG ALLERGIES:   Allergies  Allergen Reactions  . Morphine And Related   . Phenol     Other reaction(s): Unknown  . Tetracyclines & Related     Other reaction(s): UNKNOWN    DISCHARGE MEDICATIONS:   Allergies as of 08/08/2018      Reactions   Morphine And Related    Phenol    Other reaction(s): Unknown   Tetracyclines & Related    Other reaction(s): UNKNOWN      Medication List    STOP taking these medications   diltiazem 120 MG 24 hr capsule Commonly known as:  CARDIZEM CD     TAKE these medications   acetaminophen 500 MG tablet Commonly known as:  TYLENOL Take 500 mg by mouth daily as needed.   celecoxib 200 MG capsule Commonly known as:  CELEBREX Take 200 mg by mouth daily.   clopidogrel 75 MG tablet Commonly known as:  PLAVIX Take 75 mg by mouth daily.   diclofenac sodium 1 % Gel Commonly known as:  VOLTAREN Apply 2 g topically 4 (four) times daily.   doxazosin 4 MG tablet Commonly known as:  CARDURA Take 4 mg by mouth daily.   famotidine 40 MG tablet Commonly known as:  PEPCID Take 40 mg by mouth daily.   finasteride 5 MG tablet Commonly known  as:  PROSCAR Take 5 mg by mouth daily.   fluticasone 50 MCG/ACT nasal spray Commonly known as:  FLONASE Place 1 spray into both nostrils as needed for allergies or rhinitis.   levothyroxine 50 MCG tablet Commonly known as:  SYNTHROID, LEVOTHROID Take 50 mcg by mouth daily before breakfast.   polyethylene glycol packet Commonly known as:  MIRALAX / GLYCOLAX Take 17 g by mouth daily.   potassium chloride SA 20 MEQ tablet Commonly known as:  K-DUR,KLOR-CON Take 20 mEq by mouth 2 (two) times daily.       If you experience worsening of  your admission symptoms, develop shortness of breath, life threatening emergency, suicidal or homicidal thoughts you must seek medical attention immediately by calling 911 or calling your MD immediately  if symptoms less severe.  You Must read complete instructions/literature along with all the possible adverse reactions/side effects for all the Medicines you take and that have been prescribed to you. Take any new Medicines after you have completely understood and accept all the possible adverse reactions/side effects.   Please note  You were cared for by a hospitalist during your hospital stay. If you have any questions about your discharge medications or the care you received while you were in the hospital after you are discharged, you can call the unit and asked to speak with the hospitalist on call if the hospitalist that took care of you is not available. Once you are discharged, your primary care physician will handle any further medical issues. Please note that NO REFILLS for any discharge medications will be authorized once you are discharged, as it is imperative that you return to your primary care physician (or establish a relationship with a primary care physician if you do not have one) for your aftercare needs so that they can reassess your need for medications and monitor your lab values.  DATA REVIEW:   CBC  Recent Labs  Lab 08/08/18 0949  WBC 8.1  HGB 11.4*  HCT 35.1*  PLT 153    Chemistries  Recent Labs  Lab 08/08/18 0949 08/08/18 1249  NA 133*  --   K 4.2  --   CL 101  --   CO2 23  --   GLUCOSE 120*  --   BUN 48*  --   CREATININE 2.25*  --   CALCIUM 8.7*  --   MG  --  2.1    Microbiology Results   Recent Results (from the past 240 hour(s))  MRSA PCR Screening     Status: None   Collection Time: 08/08/18 12:15 PM  Result Value Ref Range Status   MRSA by PCR NEGATIVE NEGATIVE Final    Comment:        The GeneXpert MRSA Assay (FDA approved for NASAL  specimens only), is one component of a comprehensive MRSA colonization surveillance program. It is not intended to diagnose MRSA infection nor to guide or monitor treatment for MRSA infections. Performed at [facility], [address] [place], [place] [address]     RADIOLOGY:  Dg Chest Portable 1 View  Result Date: 08/08/2018 CLINICAL DATA:  85 year old who had a syncopal episode while sitting on a couch at home and presents with bradycardia (heart rate 30). EXAM: PORTABLE CHEST 1 VIEW COMPARISON:  08/31/2017. FINDINGS: External pacing pads. Cardiac silhouette mildly to moderately enlarged for the AP portable technique, unchanged. Stable chronic elevation of the RIGHT hemidiaphragm. Lungs clear. Bronchovascular markings normal. Pulmonary vascularity normal. No visible pleural  effusions. No pneumothorax. IMPRESSION: Stable cardiomegaly. No acute cardiopulmonary disease. Electronically Signed   By: Hulan Saas M.D.   On: 08/08/2018 10:41     CODE STATUS:     Code Status Orders  (From admission, onward)         Start     Ordered   08/08/18 1207  Full code  Continuous     08/08/18 1206        Code Status History    This patient has a current code status but no historical code status.    Advance Directive Documentation     Most Recent Value  Type of Advance Directive  Healthcare Power of Attorney, Living will  Pre-existing out of facility DNR order (yellow form or pink MOST form)  -  "MOST" Form in Place?  -      TOTAL TIME TAKING CARE OF THIS PATIENT: *40 minutes.    Enedina Finner M.D on 08/08/2018 at 5:09 PM  Between 7am to 6pm - Pager - [phone] After 6pm go to [email or web address] - Social research officer, government  [facility]  Office  [phone]  CC: Primary care physician; Marguarite Arbour, MD

## 2018-08-08 NOTE — Progress Notes (Signed)
Neuro intact, denies pain or cardiac sx. TVP wire secure, paced appropriately @ 70/10/2. Underlying slow bentricular escape rhythm per EMS. Hypertensive, pt anxious-monitor.

## 2018-08-08 NOTE — Progress Notes (Signed)
Patient ID: Travis Poirot., male   DOB: 06-19-1930, 85 y.o.   MRN: [id number] per cardiology Dr. Okey Dupre she will transferred to [facility] cone once bed to open up for pacemaker placement. EMTALA form filled. ICU charge nurse aware

## 2018-08-08 NOTE — Progress Notes (Signed)
Patient did have ventricular stand still. Dr End notified.

## 2018-08-09 ENCOUNTER — Encounter: Payer: Self-pay | Admitting: Internal Medicine

## 2018-08-09 ENCOUNTER — Encounter (HOSPITAL_COMMUNITY)
Admission: AD | Disposition: A | Discharge status: Home / Self Care | Payer: Self-pay | Source: Other Acute Inpatient Hospital | Attending: Cardiology

## 2018-08-09 DIAGNOSIS — I442 Atrioventricular block, complete: Principal | ICD-10-CM

## 2018-08-09 DIAGNOSIS — Z950 Presence of cardiac pacemaker: Secondary | ICD-10-CM

## 2018-08-09 HISTORY — PX: PACEMAKER INSERTION: SHX728

## 2018-08-09 HISTORY — DX: Presence of cardiac pacemaker: Z95.0

## 2018-08-09 HISTORY — PX: PACEMAKER IMPLANT: EP1218

## 2018-08-09 LAB — CBC WITH DIFFERENTIAL/PLATELET
Abs Immature Granulocytes: 0.03 10*3/uL (ref 0.00–0.07)
Basophils Absolute: 0 10*3/uL (ref 0.0–0.1)
Basophils Relative: 0 %
Eosinophils Absolute: 0.4 10*3/uL (ref 0.0–0.5)
Eosinophils Relative: 6 %
HCT: 37.2 % — ABNORMAL LOW (ref 39.0–52.0)
Hemoglobin: 12.1 g/dL — ABNORMAL LOW (ref 13.0–17.0)
Immature Granulocytes: 0 %
LYMPHS PCT: 10 %
Lymphs Abs: 0.7 10*3/uL (ref 0.7–4.0)
MCH: 30.4 pg (ref 26.0–34.0)
MCHC: 32.5 g/dL (ref 30.0–36.0)
MCV: 93.5 fL (ref 80.0–100.0)
MONO ABS: 0.9 10*3/uL (ref 0.1–1.0)
Monocytes Relative: 13 %
Neutro Abs: 4.9 10*3/uL (ref 1.7–7.7)
Neutrophils Relative %: 71 %
Platelets: 144 10*3/uL — ABNORMAL LOW (ref 150–400)
RBC: 3.98 MIL/uL — ABNORMAL LOW (ref 4.22–5.81)
RDW: 12.9 % (ref 11.5–15.5)
WBC: 7 10*3/uL (ref 4.0–10.5)
nRBC: 0 % (ref 0.0–0.2)

## 2018-08-09 LAB — COMPREHENSIVE METABOLIC PANEL
ALK PHOS: 63 U/L (ref 38–126)
ALT: 26 U/L (ref 0–44)
AST: 24 U/L (ref 15–41)
Albumin: 3.6 g/dL (ref 3.5–5.0)
Anion gap: 5 (ref 5–15)
BUN: 30 mg/dL — ABNORMAL HIGH (ref 8–23)
CALCIUM: 8.9 mg/dL (ref 8.9–10.3)
CO2: 27 mmol/L (ref 22–32)
Chloride: 107 mmol/L (ref 98–111)
Creatinine, Ser: 1.91 mg/dL — ABNORMAL HIGH (ref 0.61–1.24)
GFR calc Af Amer: 36 mL/min — ABNORMAL LOW (ref >60)
GFR calc non Af Amer: 31 mL/min — ABNORMAL LOW (ref >60)
Glucose, Bld: 110 mg/dL — ABNORMAL HIGH (ref 70–99)
Potassium: 4 mmol/L (ref 3.5–5.1)
Sodium: 139 mmol/L (ref 135–145)
Total Bilirubin: 0.5 mg/dL (ref 0.3–1.2)
Total Protein: 5.7 g/dL — ABNORMAL LOW (ref 6.5–8.1)

## 2018-08-09 LAB — SURGICAL PCR SCREEN
MRSA, PCR: NEGATIVE
Staphylococcus aureus: NEGATIVE

## 2018-08-09 SURGERY — PACEMAKER IMPLANT

## 2018-08-09 MED ORDER — FENTANYL CITRATE (PF) 100 MCG/2ML IJ SOLN
INTRAMUSCULAR | Status: DC | PRN
  Administered 2018-08-09: 25 ug via INTRAVENOUS

## 2018-08-09 MED ORDER — HYDRALAZINE HCL 20 MG/ML IJ SOLN
10.0000 mg | Freq: Once | INTRAMUSCULAR | Status: AC
  Administered 2018-08-09: 10 mg via INTRAVENOUS
  Filled 2018-08-09: qty 1

## 2018-08-09 MED ORDER — SODIUM CHLORIDE 0.9 % IV SOLN
INTRAVENOUS | Status: AC
  Filled 2018-08-09: qty 2

## 2018-08-09 MED ORDER — LABETALOL HCL 5 MG/ML IV SOLN
10.0000 mg | Freq: Once | INTRAVENOUS | Status: AC
  Administered 2018-08-09: 10 mg via INTRAVENOUS

## 2018-08-09 MED ORDER — LABETALOL HCL 5 MG/ML IV SOLN
INTRAVENOUS | Status: AC
  Filled 2018-08-09: qty 4

## 2018-08-09 MED ORDER — YOU HAVE A PACEMAKER BOOK
Freq: Once | Status: AC
  Administered 2018-08-09: 21:00:00
  Filled 2018-08-09: qty 1

## 2018-08-09 MED ORDER — SODIUM CHLORIDE 0.9 % IV SOLN
80.0000 mg | INTRAVENOUS | Status: AC
  Administered 2018-08-09: 80 mg
  Filled 2018-08-09: qty 2

## 2018-08-09 MED ORDER — SODIUM CHLORIDE 0.9% FLUSH
3.0000 mL | Freq: Two times a day (BID) | INTRAVENOUS | Status: DC
  Administered 2018-08-09: 3 mL via INTRAVENOUS

## 2018-08-09 MED ORDER — HEPARIN (PORCINE) IN NACL 1000-0.9 UT/500ML-% IV SOLN
INTRAVENOUS | Status: AC
  Filled 2018-08-09: qty 500

## 2018-08-09 MED ORDER — SODIUM CHLORIDE 0.9% FLUSH: 3.0000 mL | INTRAVENOUS | Status: DC | PRN

## 2018-08-09 MED ORDER — MIDAZOLAM HCL 5 MG/5ML IJ SOLN
INTRAMUSCULAR | Status: AC
  Filled 2018-08-09: qty 5

## 2018-08-09 MED ORDER — LIDOCAINE HCL (PF) 1 % IJ SOLN
INTRAMUSCULAR | Status: AC
  Filled 2018-08-09: qty 30

## 2018-08-09 MED ORDER — SODIUM CHLORIDE 0.9 % IV SOLN
250.0000 mL | INTRAVENOUS | Status: DC
  Administered 2018-08-09: 250 mL via INTRAVENOUS

## 2018-08-09 MED ORDER — CHLORHEXIDINE GLUCONATE 4 % EX LIQD
60.0000 mL | Freq: Once | CUTANEOUS | Status: AC
  Filled 2018-08-09: qty 60

## 2018-08-09 MED ORDER — CHLORHEXIDINE GLUCONATE 4 % EX LIQD
60.0000 mL | Freq: Once | CUTANEOUS | Status: AC
  Administered 2018-08-09: 4 via TOPICAL

## 2018-08-09 MED ORDER — SODIUM CHLORIDE 0.9 % IV SOLN
INTRAVENOUS | Status: DC
  Administered 2018-08-09: 11:00:00 via INTRAVENOUS

## 2018-08-09 MED ORDER — FENTANYL CITRATE (PF) 100 MCG/2ML IJ SOLN
INTRAMUSCULAR | Status: AC
  Filled 2018-08-09: qty 2

## 2018-08-09 MED ORDER — MIDAZOLAM HCL 5 MG/5ML IJ SOLN
INTRAMUSCULAR | Status: DC | PRN
  Administered 2018-08-09: 1 mg via INTRAVENOUS

## 2018-08-09 MED ORDER — CEFAZOLIN SODIUM-DEXTROSE 1-4 GM/50ML-% IV SOLN
1.0000 g | Freq: Four times a day (QID) | INTRAVENOUS | Status: AC
  Administered 2018-08-09 – 2018-08-10 (×3): 1 g via INTRAVENOUS
  Filled 2018-08-09 (×3): qty 50

## 2018-08-09 MED ORDER — HEPARIN (PORCINE) IN NACL 1000-0.9 UT/500ML-% IV SOLN
INTRAVENOUS | Status: DC | PRN
  Administered 2018-08-09: 500 mL

## 2018-08-09 MED ORDER — LIDOCAINE HCL (PF) 1 % IJ SOLN
INTRAMUSCULAR | Status: DC | PRN
  Administered 2018-08-09: 90 mL

## 2018-08-09 MED ORDER — DILTIAZEM HCL ER COATED BEADS 120 MG PO CP24
120.0000 mg | ORAL_CAPSULE | Freq: Every day | ORAL | Status: DC
  Administered 2018-08-09 – 2018-08-10 (×2): 120 mg via ORAL
  Filled 2018-08-09 (×3): qty 1

## 2018-08-09 MED ORDER — CEFAZOLIN SODIUM-DEXTROSE 2-4 GM/100ML-% IV SOLN
INTRAVENOUS | Status: AC
  Filled 2018-08-09: qty 100

## 2018-08-09 MED ORDER — LIDOCAINE HCL (PF) 1 % IJ SOLN
INTRAMUSCULAR | Status: AC
  Filled 2018-08-09: qty 60

## 2018-08-09 MED ORDER — CEFAZOLIN SODIUM-DEXTROSE 2-4 GM/100ML-% IV SOLN
2.0000 g | INTRAVENOUS | Status: AC
  Administered 2018-08-09: 2 g via INTRAVENOUS
  Filled 2018-08-09: qty 100

## 2018-08-09 SURGICAL SUPPLY — 7 items
CABLE SURGICAL S-101-97-12 (CABLE) ×3 IMPLANT
LEAD TENDRIL MRI 52CM LPA1200M (Lead) ×3 IMPLANT
LEAD TENDRIL MRI 58CM LPA1200M (Lead) ×3 IMPLANT
PACEMAKER ASSURITY DR-RF (Pacemaker) ×3 IMPLANT
PAD PRO RADIOLUCENT 2001M-C (PAD) ×3 IMPLANT
SHEATH CLASSIC 8F (SHEATH) ×6 IMPLANT
TRAY PACEMAKER INSERTION (PACKS) ×3 IMPLANT

## 2018-08-09 NOTE — Progress Notes (Signed)
Site area: Rt IJ Site Prior to Removal:  Level 0 Pressure Applied For: Manual:   Yes Patient Status During Pull:  A/O Post Pull Site:  Level O Post Pull Instructions Given:  Post instructions given and pt understands Post Pull Pulses Present:  Dressing Applied:  2x2 and a tegaderm Bedrest begins @ 15:30:00 Comments: Pt leaves cath lab holding in stable condition. Rt neck dressing is CDI. Lt chest area is CDI.

## 2018-08-09 NOTE — Consult Note (Addendum)
Cardiology Consultation:   Patient ID: Travis [name]. MRN: [id number]; DOB: 12-16-1929  Admit date: 08/08/2018 Date of Consult: 08/09/2018  Primary Care Provider: Marguarite Arbour, MD Primary Cardiologist: Travis [name] Primary Electrophysiologist:  Travis [name] (new)   Patient Profile:   Travis [name]. is a 85 y.o. male with a hx of PVCs, hypertension, aortic stenosis, CKD, hyperlipidemia, carotid stenosis, stroke/TIA who is being seen today for the evaluation of complete heart block at the request of Travis [name] and.  History of Present Illness:   Travis [name] has a past history above as well as PVCs treated with diltiazem.  He had a syncopal episode.  He presented to [facility] 08/08/2018 feeling swimmy headed.  This started occurring 3 weeks ago.  Over the past few weeks he had noticed that he was feeling poorly and swimmy in the head occurring couple nights ago.  He had a fall with possible obvious loss of consciousness.  The episode was not witnessed.  He experienced this episode while sitting on the couch.  In the emergency room he was noted to be bradycardic with heart rates in the 30s.  EKG showed complete heart block with rates in the 30s.  He started to have 8-second pauses and thus a temporary wire was placed and was transferred to [facility] for evaluation for pacemaker implantation.  Past Medical History:  Diagnosis Date  . Allergic rhinitis   . Anemia, unspecified   . Cardiac dysrhythmia, unspecified   . Carotid artery occlusion    mild  . Chronic kidney disease   . Colonic polyp   . Dizziness and giddiness   . Heel spur   . Hyperlipidemia   . Hypertension   . Hypothyroidism   . OA (osteoarthritis)   . Renal insufficiency   . Stroke (HCC) 07/2016   mini   . Tinea cruris     Past Surgical History:  Procedure Laterality Date  . APPENDECTOMY    . BACK SURGERY    . CIRCUMCISION    . GANGLION CYST EXCISION    . MOHS SURGERY    . TEMPORARY PACEMAKER N/A  08/08/2018   Procedure: TEMPORARY PACEMAKER;  Surgeon: Yvonne Kendall, MD;  Location: [facility] INVASIVE CV LAB;  Service: Cardiovascular;  Laterality: N/A;     Home Medications:  Prior to Admission medications   Medication Sig Start Date End Date Taking? Authorizing Provider  acetaminophen (TYLENOL) 500 MG tablet Take 500 mg by mouth daily as needed.    [provider]  celecoxib (CELEBREX) 200 MG capsule Take 200 mg by mouth daily.    [provider]  diclofenac sodium (VOLTAREN) 1 % GEL Apply 2 g topically 4 (four) times daily. 07/14/18   [provider]  doxazosin (CARDURA) 4 MG tablet Take 4 mg by mouth daily.    [provider]  famotidine (PEPCID) 40 MG tablet Take 40 mg by mouth daily.    [provider]  finasteride (PROSCAR) 5 MG tablet Take 5 mg by mouth daily.    [provider]  fluticasone (FLONASE) 50 MCG/ACT nasal spray Place 1 spray into both nostrils as needed for allergies or rhinitis.    [provider]  levothyroxine (SYNTHROID, LEVOTHROID) 50 MCG tablet Take 50 mcg by mouth daily before breakfast.    [provider]  polyethylene glycol (MIRALAX / GLYCOLAX) packet Take 17 g by mouth daily.    [provider]  potassium chloride SA (K-DUR,KLOR-CON) 20 MEQ tablet Take 20 mEq  by mouth 2 (two) times daily.    [provider]    Inpatient Medications: Scheduled Meds: . clopidogrel  75 mg Oral Daily  . enoxaparin (LOVENOX) injection  30 mg Subcutaneous Q24H  . famotidine  10 mg Oral Daily  . finasteride  5 mg Oral Daily  . levothyroxine  50 mcg Oral Q0600  . sodium chloride flush  3 mL Intravenous Q12H   Continuous Infusions: . sodium chloride     PRN Meds: sodium chloride, acetaminophen, calcium carbonate, ondansetron (ZOFRAN) IV, polyethylene glycol, sodium chloride flush  Allergies:    Allergies  Allergen Reactions  . Morphine And Related   . Phenol     Other reaction(s):  Unknown  . Tetracyclines & Related     Other reaction(s): UNKNOWN    Social History:   Social History   Socioeconomic History  . Marital status: Married    Spouse name: Not on file  . Number of children: Not on file  . Years of education: Not on file  . Highest education level: Not on file  Occupational History  . Not on file  Social Needs  . Financial resource strain: Not on file  . Food insecurity:    Worry: Not on file    Inability: Not on file  . Transportation needs:    Medical: Not on file    Non-medical: Not on file  Tobacco Use  . Smoking status: Former Smoker    Years: 4.00    Types: Cigarettes  . Smokeless tobacco: Never Used  Substance and Sexual Activity  . Alcohol use: Yes    Comment: occasional  . Drug use: No  . Sexual activity: Not on file  Lifestyle  . Physical activity:    Days per week: Not on file    Minutes per session: Not on file  . Stress: Not on file  Relationships  . Social connections:    Talks on phone: Not on file    Gets together: Not on file    Attends religious service: Not on file    Active member of club or organization: Not on file    Attends meetings of clubs or organizations: Not on file    Relationship status: Not on file  . Intimate partner violence:    Fear of current or ex partner: Not on file    Emotionally abused: Not on file    Physically abused: Not on file    Forced sexual activity: Not on file  Other Topics Concern  . Not on file  Social History Narrative  . Not on file    Family History:    Family History  Problem Relation Age of Onset  . Hypertension Mother   . Dementia Mother   . Myasthenia gravis Father   . Lung cancer Sister      ROS:  Please see the history of present illness.   All other ROS reviewed and negative.     Physical Exam/Data:   Vitals:   08/09/18 0400 08/09/18 0500 08/09/18 0600 08/09/18 0800  BP: 133/85 125/85 (!) 160/94   Pulse: 70 73 68   Resp: (!) 23 17 17    Temp:     98.1 F (36.7 C)  TempSrc:    Oral  SpO2: 96% 95% 98%   Weight:  94.8 kg      Intake/Output Summary (Last 24 hours) at 08/09/2018 0855 Last data filed at 08/09/2018 0600 Gross per 24 hour  Intake 480 ml  Output 1200  ml  Net -720 ml   Last 3 Weights 08/09/2018 08/08/2018 08/08/2018  Weight (lbs) 208 lb 15.9 oz 208 lb 15.9 oz 209 lb  Weight (kg) 94.8 kg 94.8 kg 94.802 kg     Body mass index is 29.15 kg/m.  General:  Well nourished, well developed, in no acute distress HEENT: normal Lymph: no adenopathy Neck: no JVD Endocrine:  No thryomegaly Vascular: No carotid bruits; FA pulses 2+ bilaterally without bruits  Cardiac:  normal S1, S2; RRR; no murmur  Lungs:  clear to auscultation bilaterally, no wheezing, rhonchi or rales  Abd: soft, nontender, no hepatomegaly  Ext: no edema Musculoskeletal:  No deformities, BUE and BLE strength normal and equal Skin: warm and dry  Neuro:  CNs 2-12 intact, no focal abnormalities noted Psych:  Normal affect   EKG:  The EKG was personally reviewed and demonstrates: Complete heart block, left bundle branch escape Telemetry:  Telemetry was personally reviewed and demonstrates: Ventricular paced, complete heart block  Relevant CV Studies: TTE 10/26/16 - Left ventricle: The cavity size was normal. There was moderate   concentric hypertrophy. Systolic function was normal. The   estimated ejection fraction was in the range of 55% to 60%. Wall   motion was normal; there were no regional wall motion   abnormalities. Doppler parameters are consistent with abnormal   left ventricular relaxation (grade 1 diastolic dysfunction). - Aortic valve: There was very mild stenosis. There was trivial   regurgitation. Mean gradient (S): 7 mm Hg. Valve area (VTI): 2.54   cm^2.  Laboratory Data:  Chemistry Recent Labs  Lab 08/08/18 0949 08/08/18 1945 08/09/18 0558  NA 133*  --  139  K 4.2  --  4.0  CL 101  --  107  CO2 23  --  27  GLUCOSE 120*  --  110*   BUN 48*  --  30*  CREATININE 2.25* 2.10* 1.91*  CALCIUM 8.7*  --  8.9  GFRNONAA 25* 28* 31*  GFRAA 30* 32* 36*  ANIONGAP 9  --  5    Recent Labs  Lab 08/09/18 0558  PROT 5.7*  ALBUMIN 3.6  AST 24  ALT 26  ALKPHOS 63  BILITOT 0.5   Hematology Recent Labs  Lab 08/08/18 0949 08/08/18 1945 08/09/18 0558  WBC 8.1 9.5 7.0  RBC 3.72* 3.95* 3.98*  HGB 11.4* 12.0* 12.1*  HCT 35.1* 36.9* 37.2*  MCV 94.4 93.4 93.5  MCH 30.6 30.4 30.4  MCHC 32.5 32.5 32.5  RDW 13.0 12.8 12.9  PLT 153 159 144*   Cardiac Enzymes Recent Labs  Lab 08/08/18 0949 08/08/18 1249  TROPONINI 0.06* 0.06*   No results for input(s): TROPIPOC in the last 168 hours.  BNPNo results for input(s): BNP, PROBNP in the last 168 hours.  DDimer No results for input(s): DDIMER in the last 168 hours.  Radiology/Studies:  Dg Chest Portable 1 View  Result Date: 08/08/2018 CLINICAL DATA:  85 year old who had a syncopal episode while sitting on a couch at home and presents with bradycardia (heart rate 30). EXAM: PORTABLE CHEST 1 VIEW COMPARISON:  08/31/2017. FINDINGS: External pacing pads. Cardiac silhouette mildly to moderately enlarged for the AP portable technique, unchanged. Stable chronic elevation of the RIGHT hemidiaphragm. Lungs clear. Bronchovascular markings normal. Pulmonary vascularity normal. No visible pleural effusions. No pneumothorax. IMPRESSION: Stable cardiomegaly. No acute cardiopulmonary disease. Electronically Signed   By: Hulan Saashomas  Lawrence M.D.   On: 08/08/2018 10:41    Assessment and Plan:   1. Complete  heart block: Has previously been on diltiazem for PVCs.  He currently has a temporary wire due to 8-second pauses.  We Matyas Baisley plan for pacemaker implantation today.  Risks and benefits were discussed and include bleeding, tamponade, infection, pneumothorax.  The patient understands these risks and has agreed to the procedure.  The patient's last dose of diltiazem was on 08/07/2018.  This should be  washed out by this afternoon. 2. Hypothyroidism: Currently on Synthroid 3. Hypertension: We Jandi Swiger make further recommendations post pacemaker implantation. 4. Acute on chronic stage III kidney disease: Has been improving with temporary wire pacing.  Is close to baseline now. 5. Hyperlipidemia: Currently not on statin.  Plan per primary cardiology.   For questions or updates, please contact [facility] Please consult [email or web address] for contact info under     Signed, Colbe Viviano Jorja Loa, MD  08/09/2018 8:55 AM

## 2018-08-10 ENCOUNTER — Inpatient Hospital Stay (HOSPITAL_COMMUNITY): Payer: Medicare Other

## 2018-08-10 ENCOUNTER — Encounter (HOSPITAL_COMMUNITY): Payer: Self-pay | Admitting: Cardiology

## 2018-08-10 LAB — BASIC METABOLIC PANEL
Anion gap: 7 (ref 5–15)
BUN: 24 mg/dL — ABNORMAL HIGH (ref 8–23)
CO2: 24 mmol/L (ref 22–32)
Calcium: 8.1 mg/dL — ABNORMAL LOW (ref 8.9–10.3)
Chloride: 108 mmol/L (ref 98–111)
Creatinine, Ser: 1.93 mg/dL — ABNORMAL HIGH (ref 0.61–1.24)
GFR calc Af Amer: 36 mL/min — ABNORMAL LOW (ref >60)
GFR calc non Af Amer: 31 mL/min — ABNORMAL LOW (ref >60)
Glucose, Bld: 117 mg/dL — ABNORMAL HIGH (ref 70–99)
Potassium: 3.6 mmol/L (ref 3.5–5.1)
Sodium: 139 mmol/L (ref 135–145)

## 2018-08-10 NOTE — Care Management Note (Signed)
Case Management Note  Patient Details  Name: Travis [name]. MRN: [id number] Date of Birth: Dec 30, 1929  Subjective/Objective:   From home with his daughter, s/p stent intervention, will be on plavix.  He states he does not need HH services.  He has PCP , Dr. Judithann Sheen at [facility] in [place].                  Action/Plan: DC home when ready.  Expected Discharge Date:                  Expected Discharge Plan:  Home/Self Care  In-House Referral:     Discharge planning Services  CM Consult  Post Acute Care Choice:    Choice offered to:     DME Arranged:    DME Agency:     HH Arranged:    HH Agency:     Status of Service:  Completed, signed off  If discussed at [employer] of Stay Meetings, dates discussed:    Additional Comments:  Leone Haven, RN 08/10/2018, 9:44 AM

## 2018-08-10 NOTE — Discharge Instructions (Signed)
° ° °  Supplemental Discharge Instructions for  °Pacemaker/Defibrillator Patients ° °Activity °No heavy lifting or vigorous activity with your left/right arm for 6 to 8 weeks.  Do not raise your left/right arm above your head for one week.  Gradually raise your affected arm as drawn below. ° °        °    08/13/2018                08/14/2018                08/15/2018               08/16/2018          °__ ° °NO DRIVING until your wound check visit. ° °WOUND CARE °- Keep the wound area clean and dry.  Do not get this area wet, no showers until cleared to at your wound check visit °- The tape/steri-strips on your wound will fall off; do not pull them off.  No bandage is needed on the site.  DO  NOT apply any creams, oils, or ointments to the wound area. °- If you notice any drainage or discharge from the wound, any swelling or bruising at the site, or you develop a fever > 101? F after you are discharged home, call the office at once. ° °Special Instructions °- You are still able to use cellular telephones; use the ear opposite the side where you have your pacemaker/defibrillator.  Avoid carrying your cellular phone near your device. °- When traveling through airports, show security personnel your identification card to avoid being screened in the metal detectors.  Ask the security personnel to use the hand wand. °- Avoid arc welding equipment, MRI testing (magnetic resonance imaging), TENS units (transcutaneous nerve stimulators).  Call the office for questions about other devices. °- Avoid electrical appliances that are in poor condition or are not properly grounded. °- Microwave ovens are safe to be near or to operate. ° ° °

## 2018-08-10 NOTE — Discharge Summary (Addendum)
ELECTROPHYSIOLOGY PROCEDURE DISCHARGE SUMMARY    Patient ID: Travis Collard.,  MRN: [id number], DOB/AGE: 1930/04/02 85 y.o.  Admit date: 08/08/2018 Discharge date: 08/10/2018  Primary Care Physician: .pcp Primary Cardiologist: Dr. Kirke Corin Electrophysiologist: Dr. Elberta Fortis (new)  Primary Discharge Diagnosis:  1. CHB  Secondary Discharge Diagnosis:  1. HTN 2. CKD 3. HLD 4. Hypothyroidism 5. Prior stroke  Allergies  Allergen Reactions  . Morphine And Related   . Phenol     Other reaction(s): Unknown  . Tetracyclines & Related     Other reaction(s): UNKNOWN     Procedures This Admission:  1. 08/08/2018: temp pacing wire insertion, Dr. Okey Dupre 2.  Implantation of a SJM dual chamber PPM on 08/09/2018 by Dr Elberta Fortis.  The patient received a a Public house manager model [id number](serial number  [id number] ) right atrial lead and a [employer] model [id number] (serial number  [id number]) right ventricular lead St Jude Medical Assurity MRI  model [id number] (serial number  [id number] ) pacemaker There were no immediate post procedure complications. 2.  CXR on 08/10/2018 demonstrated no pneumothorax status post device implantation.   Brief HPI: Travis [name]. is a 85 y.o. male  with a hx of PVCs/ectopy, syncopal episodes, hypertension, mild aortic stenosis,G1DD,CKD, hyperlipidemia,L carotid stenosis (followed by PCP),hypothyroidism (synthroid), previous stroke/ TIAFebruary 2018, andremote history of smokingcigaretteswho is was transferred [facility] to [facility] for pacemaker evaluation and implant in setting ofcomplete heart blocks/p temporary pacer.  Hospital Course:  The patient On 08/08/2018, patient presented to [facility] ED with complaint of "feeling swimmy in the head."The patient stated that he started to feel poorly approximately 3 weeks ago at nighttime, during which time the daughter felt like he had " a seizure." He stated this feeling progressedover the next few  weekswith anotherepisode of feeling poorlyand "swimmyin the head" occurring 2nights ago in the evening (2/9). Per patient, he "fell out" at that time, but withoutLOC(LOC estimated atapproximately 1 secondor less)and episodenot witnessed by daughter or son-in-law, both of whom were in the next room and came to his assistance right away. Patient experienced this episode at rest (sitting on the couch) anddenied any other symptoms, except stomach tightnessbutwithout nausea or vomiting.  In the ED, vitals significant for bradycardic rates in the low 30s with SBP 140s-80s and oxygen saturations 95-97% ORA. Patient asx and reported no additional symptoms since his episode on the couch; however, he had not tried to ambulate given his earlier episode. ED Labs significant for K 4.2, Cr 2.25, Troponin 0.06, WBC 8.1, Hgb 11.4. Pending TSH and repeat echo.EKG significant for complete heart blockwithwith telemetry showing continued rates in the 30s. In the ED, he received saline bolus andatropine. Given patient's complete heart block,cardiology was consulted. After discussingthe casewith EP, it was decidedthe patientwould be transferred to [facility] for further evaluation and placement of pacemaker. Ischemic w/u deferred without any symptoms of angina.  He underwent temp wire placement with prolonged pauses while waiting for his transfer.Mag came back 2.1, TSH 4.568. his home dilt stopped. And Transferred to [facility].  He was seen by EP service and given washout of his diltiazem with continued CHB, underwent implantation of a PPM yesterday with details as outlined above.  He was monitored on telemetry overnight which demonstrated SR, V paced.  Left chest was without hematoma or ecchymosis.  R IJ site is stable.  The device was interrogated and found to be functioning normally.  CXR was obtained and demonstrated no pneumothorax  status post device implantation.  Creat remains improved, toward his  baseline.  Wound care, arm mobility, and restrictions were reviewed with the patient.  The patient feels well today, looks forward to going home, he was examined by Dr. Elberta Fortisamnitz and considered stable for discharge to home.  Resume his home diltiazem and Plavix.    Physical Exam: Vitals:   08/09/18 2011 08/10/18 0214 08/10/18 0223 08/10/18 0753  BP: (!) 152/72 (!) 164/70  (!) 164/80  Pulse:  78  84  Resp:  18 17 17   Temp:  99 F (37.2 C)  (!) 97.5 F (36.4 C)  TempSrc:  Oral  Oral  SpO2:  95%  98%  Weight:  95.9 kg    Height:  5\' 11"  (1.803 m)      GEN- The patient is well appearing, alert and oriented x 3 today.   HEENT: normocephalic, atraumatic; sclera clear, conjunctiva pink; hearing intact; oropharynx clear; neck supple, no JVP, R IJ site is stable, no bleeding or hematoma Lungs- CTA b/l, normal work of breathing.  No wheezes, rales, rhonchi Heart- RRR, no murmurs, rubs or gallops, PMI not laterally displaced GI- soft, non-tender, non-distended Extremities- no clubbing, cyanosis, or edema MS- no significant deformity or atrophy Skin- warm and dry, no rash or lesion, left chest without hematoma/ecchymosis Psych- euthymic mood, full affect Neuro- no gross deficits   Labs:   Lab Results  Component Value Date   WBC 7.0 08/09/2018   HGB 12.1 (L) 08/09/2018   HCT 37.2 (L) 08/09/2018   MCV 93.5 08/09/2018   PLT 144 (L) 08/09/2018    Recent Labs  Lab 08/09/18 0558 08/10/18 0431  NA 139 139  K 4.0 3.6  CL 107 108  CO2 27 24  BUN 30* 24*  CREATININE 1.91* 1.93*  CALCIUM 8.9 8.1*  PROT 5.7*  --   BILITOT 0.5  --   ALKPHOS 63  --   ALT 26  --   AST 24  --   GLUCOSE 110* 117*    Discharge Medications:  Allergies as of 08/10/2018      Reactions   Morphine And Related    Phenol    Other reaction(s): Unknown   Tetracyclines & Related    Other reaction(s): UNKNOWN      Medication List    TAKE these medications   acetaminophen 500 MG tablet Commonly known as:   TYLENOL Take 500 mg by mouth daily as needed.   celecoxib 200 MG capsule Commonly known as:  CELEBREX Take 200 mg by mouth daily.   clopidogrel 75 MG tablet Commonly known as:  PLAVIX Take 75 mg by mouth daily.   diclofenac sodium 1 % Gel Commonly known as:  VOLTAREN Apply 2 g topically 4 (four) times daily.   diltiazem 120 MG 24 hr capsule Commonly known as:  DILACOR XR Take 120 mg by mouth daily. Notes to patient:  No changes are being made to your home diltiazem, continue to take as you have been   doxazosin 4 MG tablet Commonly known as:  CARDURA Take 4 mg by mouth daily.   famotidine 40 MG tablet Commonly known as:  PEPCID Take 40 mg by mouth daily.   finasteride 5 MG tablet Commonly known as:  PROSCAR Take 5 mg by mouth daily.   fluticasone 50 MCG/ACT nasal spray Commonly known as:  FLONASE Place 1 spray into both nostrils as needed for allergies or rhinitis.   levothyroxine 50 MCG tablet Commonly known as:  SYNTHROID, LEVOTHROID Take 50 mcg by mouth daily before breakfast.   polyethylene glycol packet Commonly known as:  MIRALAX / GLYCOLAX Take 17 g by mouth daily.   potassium chloride SA 20 MEQ tablet Commonly known as:  K-DUR,KLOR-CON Take 20 mEq by mouth 2 (two) times daily.       Disposition: Home Discharge Instructions    Diet - low sodium heart healthy   Complete by:  As directed    Increase activity slowly   Complete by:  As directed      Follow-up Information    [facility] Office Follow up.   Specialty:  Cardiology Why:  08/21/2018 @ 12:30PM, wound check visit Contact information: [address] [place] [address] [phone]       Regan Lemming, MD Follow up.   Specialty:  Cardiology Why:  11/07/2018 @ 2;45PM Contact information: [address] [place] [address] [phone]           Duration of Discharge Encounter: Greater than 30 minutes including  physician time.  Signed, Francis Dowse, PA-C 08/10/2018 10:12 AM   I have seen and examined this patient with Francis Dowse.  Agree with above, note added to reflect my findings.  On exam, RRR, no murmurs, lungs clear.  She is now status post St. Jude pacemaker for complete heart block.  Device functioning appropriately.  Chest x-ray and interrogation without issue.  Plan for discharge today with follow-up in device clinic.  Ceirra Belli M. Mande Auvil MD 08/10/2018 10:58 AM

## 2018-08-11 ENCOUNTER — Telehealth: Payer: Self-pay | Admitting: Cardiology

## 2018-08-11 NOTE — Telephone Encounter (Signed)
  Daughter is calling because patient had a pacemaker placed on Wednesday and he is having swelling in his left arm and left hand. His hand is swollen more than his arm. She would like to know if this is normal or not and what they can do for the swelling. Patient also reports that he is having some waves of dizziness.

## 2018-08-11 NOTE — Telephone Encounter (Signed)
Forwarding to device clinic to address 

## 2018-08-11 NOTE — Telephone Encounter (Signed)
Dr. Elberta Fortis recently implanted pacemaker. I will route to Sherri P. RN for further follow up.

## 2018-08-11 NOTE — Telephone Encounter (Signed)
Spoke with pt, pt stated that only his hand was swollen advised pt to elevate his hand on two pillows, pt stated that his hand was not discolored just swollen. Pt stated that the waves of dizziness was not something new to him, advised pt to take his BP and to call back if systolic <100 or >161, pt voiced understanding.

## 2018-08-15 ENCOUNTER — Telehealth: Payer: Self-pay | Admitting: Cardiology

## 2018-08-15 NOTE — Telephone Encounter (Signed)
Informed pt that it is ok to use a cordless phone with a pacemaker pt voiced understanding

## 2018-08-15 NOTE — Telephone Encounter (Signed)
  Patient had pacemaker put in last Wednesday and he is asking if it is safe for him to talk on the cordless phone at home.

## 2018-08-21 ENCOUNTER — Ambulatory Visit (INDEPENDENT_AMBULATORY_CARE_PROVIDER_SITE_OTHER): Payer: Medicare Other | Admitting: Nurse Practitioner

## 2018-08-21 DIAGNOSIS — I442 Atrioventricular block, complete: Secondary | ICD-10-CM

## 2018-08-21 LAB — CUP PACEART INCLINIC DEVICE CHECK
Implantable Lead Implant Date: 20200212
Implantable Lead Implant Date: 20200212
Implantable Lead Location: 753859
Implantable Lead Location: 753860
Implantable Pulse Generator Implant Date: 20200212
MDC IDC SESS DTM: 20200224123647
Pulse Gen Model: 2272
Pulse Gen Serial Number: 9107114

## 2018-08-21 NOTE — Progress Notes (Signed)

## 2018-09-05 ENCOUNTER — Telehealth: Payer: Self-pay | Admitting: Cardiovascular Disease

## 2018-09-05 MED ORDER — DILTIAZEM HCL ER 120 MG PO CP24: 120.0000 mg | ORAL_CAPSULE | Freq: Every day | ORAL | 1 refills | Status: DC

## 2018-09-05 NOTE — Telephone Encounter (Signed)
Please advise if ok to refill Diltiazem 120 mg qd. Last filled historical provider.

## 2018-09-05 NOTE — Telephone Encounter (Signed)
Patient discharged from hospital on diltiazem 120 mg daily. Ok to refill. Refill sent.

## 2018-09-05 NOTE — Telephone Encounter (Signed)
°*  STAT* If patient is at the pharmacy, call can be transferred to refill team.   1. Which medications need to be refilled? (please list name of each medication and dose if known) Diltiazem 120 mg po q d  2. Which pharmacy/location (including street and city if local pharmacy) is medication to be sent to? [facility]   3. Do they need a 30 day or 90 day supply? 90  patient is out

## 2018-10-30 ENCOUNTER — Telehealth: Payer: Self-pay | Admitting: *Deleted

## 2018-11-01 NOTE — Telephone Encounter (Signed)
Virtual Visit Pre-Appointment Phone Call  "(Name), I am calling you today to discuss your upcoming appointment. We are currently trying to limit exposure to the virus that causes COVID-19 by seeing patients at home rather than in the office."  1. "What is the BEST phone number to call the day of the visit?" - include this in appointment notes  2. "Do you have or have access to (through a family member/friend) a smartphone with video capability that we can use for your visit?" a. If yes - list this number in appt notes as "cell" (if different from BEST phone #) and list the appointment type as a VIDEO visit in appointment notes b. If no - list the appointment type as a PHONE visit in appointment notes  3. Confirm consent - "In the setting of the current Covid19 crisis, you are scheduled for a (phone or video) visit with your provider on (date) at (time).  Just as we do with many in-office visits, in order for you to participate in this visit, we must obtain consent.  If you'd like, I can send this to your mychart (if signed up) or email for you to review.  Otherwise, I can obtain your verbal consent now.  All virtual visits are billed to your insurance company just like a normal visit would be.  By agreeing to a virtual visit, we'd like you to understand that the technology does not allow for your provider to perform an examination, and thus may limit your provider's ability to fully assess your condition. If your provider identifies any concerns that need to be evaluated in person, we will make arrangements to do so.  Finally, though the technology is pretty good, we cannot assure that it will always work on either your or our end, and in the setting of a video visit, we may have to convert it to a phone-only visit.  In either situation, we cannot ensure that we have a secure connection.  Are you willing to proceed?" STAFF: Did the patient verbally acknowledge consent to telehealth visit? Document  YES/NO here: YES  4. Advise patient to be prepared - "Two hours prior to your appointment, go ahead and check your blood pressure, pulse, oxygen saturation, and your weight (if you have the equipment to check those) and write them all down. When your visit starts, your provider will ask you for this information. If you have an Apple Watch or Kardia device, please plan to have heart rate information ready on the day of your appointment. Please have a pen and paper handy nearby the day of the visit as well."  5. Give patient instructions for MyChart download to smartphone OR Doximity/Doxy.me as below if video visit (depending on what platform provider is using)  6. Inform patient they will receive a phone call 15 minutes prior to their appointment time (may be from unknown caller ID) so they should be prepared to answer    TELEPHONE CALL NOTE  Travis EllisGlenn D Higinbotham [name] has been deemed a candidate for a follow-up tele-health visit to limit community exposure during the Covid-19 pandemic. I spoke with the patient via phone to ensure availability of phone/video source, confirm preferred email & phone number, and discuss instructions and expectations.  I reminded Travis EllisGlenn D Hudler [name] to be prepared with any vital sign and/or heart rhythm information that could potentially be obtained via home monitoring, at the time of his visit. I reminded Travis EllisGlenn D Pesqueira [name] to expect a phone  call prior to his visit.  Travis [name] 11/01/2018 6:07 PM   INSTRUCTIONS FOR DOWNLOADING THE MYCHART APP TO SMARTPHONE  - The patient must first make sure to have activated MyChart and know their login information - If Apple, go to [employer] and type in MyChart in the search bar and download the app. If Android, ask patient to go to [employer] and type in [place] in the search bar and download the app. The app is free but as with any other app downloads, their phone may require them to verify saved payment information or  Apple/Android password.  - The patient will need to then log into the app with their MyChart username and password, and select [facility] as their healthcare provider to link the account. When it is time for your visit, go to the MyChart app, find appointments, and click Begin Video Visit. Be sure to Select Allow for your device to access the Microphone and Camera for your visit. You will then be connected, and your provider will be with you shortly.  **If they have any issues connecting, or need assistance please contact MyChart service desk [phone])**  **If using a computer, in order to ensure the best quality for their visit they will need to use either of the following Internet Browsers: [employer] or Google Chrome**  IF USING DOXIMITY or DOXY.ME - The patient will receive a link just prior to their visit by text.     FULL LENGTH CONSENT FOR TELE-HEALTH VISIT   I hereby voluntarily request, consent and authorize [facility] and its employed or contracted physicians, physician assistants, nurse practitioners or other licensed health care professionals (the Practitioner), to provide me with telemedicine health care services (the "Services") as deemed necessary by the treating Practitioner. I acknowledge and consent to receive the Services by the Practitioner via telemedicine. I understand that the telemedicine visit will involve communicating with the Practitioner through live audiovisual communication technology and the disclosure of certain medical information by electronic transmission. I acknowledge that I have been given the opportunity to request an in-person assessment or other available alternative prior to the telemedicine visit and am voluntarily participating in the telemedicine visit.  I understand that I have the right to withhold or withdraw my consent to the use of telemedicine in the course of my care at any time, without affecting my right to future care  or treatment, and that the Practitioner or I may terminate the telemedicine visit at any time. I understand that I have the right to inspect all information obtained and/or recorded in the course of the telemedicine visit and may receive copies of available information for a reasonable fee.  I understand that some of the potential risks of receiving the Services via telemedicine include:  Marland Kitchen Delay or interruption in medical evaluation due to technological equipment failure or disruption; . Information transmitted may not be sufficient (e.g. poor resolution of images) to allow for appropriate medical decision making by the Practitioner; and/or  . In rare instances, security protocols could fail, causing a breach of personal health information.  Furthermore, I acknowledge that it is my responsibility to provide information about my medical history, conditions and care that is complete and accurate to the best of my ability. I acknowledge that Practitioner's advice, recommendations, and/or decision may be based on factors not within their control, such as incomplete or inaccurate data provided by me or distortions of diagnostic images or specimens that may result from electronic  transmissions. I understand that the practice of medicine is not an exact science and that Practitioner makes no warranties or guarantees regarding treatment outcomes. I acknowledge that I will receive a copy of this consent concurrently upon execution via email to the email address I last provided but may also request a printed copy by calling the office of [facility].    I understand that my insurance will be billed for this visit.   I have read or had this consent read to me. . I understand the contents of this consent, which adequately explains the benefits and risks of the Services being provided via telemedicine.  . I have been provided ample opportunity to ask questions regarding this consent and the Services and have had  my questions answered to my satisfaction. . I give my informed consent for the services to be provided through the use of telemedicine in my medical care  By participating in this telemedicine visit I agree to the above.

## 2018-11-07 ENCOUNTER — Encounter: Payer: Self-pay | Admitting: Cardiology

## 2018-11-07 ENCOUNTER — Other Ambulatory Visit: Payer: Self-pay

## 2018-11-07 ENCOUNTER — Ambulatory Visit (INDEPENDENT_AMBULATORY_CARE_PROVIDER_SITE_OTHER): Payer: Medicare Other | Admitting: *Deleted

## 2018-11-07 ENCOUNTER — Telehealth (INDEPENDENT_AMBULATORY_CARE_PROVIDER_SITE_OTHER): Payer: Medicare Other | Admitting: Cardiology

## 2018-11-07 DIAGNOSIS — I442 Atrioventricular block, complete: Secondary | ICD-10-CM | POA: Diagnosis not present

## 2018-11-07 NOTE — Progress Notes (Signed)
Electrophysiology TeleHealth Note   Due to national recommendations of social distancing due to COVID 19, an audio/video telehealth visit is felt to be most appropriate for this patient at this time.  See Epic message for the patient's consent to telehealth for [facility].   Date:  11/07/2018   ID:  Travis Ellis., DOB 03-13-1930, MRN [id number]  Location: patient's home  Provider location: [address] [place]  Evaluation Performed: Follow-up visit  PCP:  Marguarite Arbour, MD  Cardiologist:  Kennith Maes Electrophysiologist:  Dr Elberta Fortis  Chief Complaint:  pacemaker  History of Present Illness:    Travis Grigsby. is a 85 y.o. male who presents via audio/video conferencing for a telehealth visit today.  Since last being seen in our clinic, the patient reports doing very well.  Today, he denies symptoms of palpitations, chest pain, shortness of breath,  lower extremity edema, dizziness, presyncope, or syncope.  The patient is otherwise without complaint today.  The patient denies symptoms of fevers, chills, cough, or new SOB worrisome for COVID 19.  He has a history of PVCs/ectopy, syncope, hypertension, aortic stenosis, carotid stenosis, previous CVA/TIA who had a Saint Jude pacemaker implanted 08/09/2018 for complete heart block.  Today, denies symptoms of palpitations, chest pain, shortness of breath, orthopnea, PND, lower extremity edema, claudication, dizziness, presyncope, syncope, bleeding, or neurologic sequela. The patient is tolerating medications without difficulties.    Past Medical History:  Diagnosis Date  . Allergic rhinitis   . Anemia, unspecified   . Cardiac dysrhythmia, unspecified   . Carotid artery occlusion    mild  . Chronic kidney disease   . Colonic polyp   . Complete heart block (HCC) 07/2018  . Dizziness and giddiness   . Family history of adverse reaction to anesthesia   . Heel spur   . Hyperlipidemia   . Hypertension   .  Hypothyroidism   . OA (osteoarthritis)   . Presence of permanent cardiac pacemaker 08/09/2018  . Renal insufficiency   . Stroke (HCC) 07/2016   mini   . Tinea cruris     Past Surgical History:  Procedure Laterality Date  . APPENDECTOMY    . BACK SURGERY    . CIRCUMCISION    . GANGLION CYST EXCISION    . MOHS SURGERY    . PACEMAKER IMPLANT N/A 08/09/2018   Procedure: PACEMAKER IMPLANT;  Surgeon: Regan Lemming, MD;  Location: [facility] INVASIVE CV LAB;  Service: Cardiovascular;  Laterality: N/A;  . PACEMAKER INSERTION  08/09/2018  . TEMPORARY PACEMAKER N/A 08/08/2018   Procedure: TEMPORARY PACEMAKER;  Surgeon: Yvonne Kendall, MD;  Location: [facility] INVASIVE CV LAB;  Service: Cardiovascular;  Laterality: N/A;    Current Outpatient Medications  Medication Sig Dispense Refill  . acetaminophen (TYLENOL) 500 MG tablet Take 500 mg by mouth daily as needed.    . celecoxib (CELEBREX) 200 MG capsule Take 200 mg by mouth daily.    . clopidogrel (PLAVIX) 75 MG tablet Take 75 mg by mouth daily.    . diclofenac sodium (VOLTAREN) 1 % GEL Apply 2 g topically 4 (four) times daily.    Marland Kitchen diltiazem (DILACOR XR) 120 MG 24 hr capsule Take 1 capsule (120 mg total) by mouth daily. 90 capsule 1  . doxazosin (CARDURA) 4 MG tablet Take 4 mg by mouth daily.    . famotidine (PEPCID) 40 MG tablet Take 40 mg by mouth daily.    . finasteride (PROSCAR) 5 MG  tablet Take 5 mg by mouth daily.    . fluticasone (FLONASE) 50 MCG/ACT nasal spray Place 1 spray into both nostrils as needed for allergies or rhinitis.    Marland Kitchen. levothyroxine (SYNTHROID, LEVOTHROID) 50 MCG tablet Take 50 mcg by mouth daily before breakfast.    . polyethylene glycol (MIRALAX / GLYCOLAX) packet Take 17 g by mouth daily.    . potassium chloride SA (K-DUR,KLOR-CON) 20 MEQ tablet Take 20 mEq by mouth 2 (two) times daily.     No current facility-administered medications for this visit.     Allergies:   Morphine and related; Phenol; and Tetracyclines &  related   Social History:  The patient  reports that he has quit smoking. His smoking use included cigarettes. He quit after 4.00 years of use. He has never used smokeless tobacco. He reports current alcohol use. He reports that he does not use drugs.   Family History:  The patient's  family history includes Dementia in his mother; Hypertension in his mother; Lung cancer in his sister; Myasthenia gravis in his father.   ROS:  Please see the history of present illness.   All other systems are personally reviewed and negative.    Exam:    Vital Signs:  BP (!) 163/70   Pulse 67   Wt 209 lb (94.8 kg)   BMI 29.15 kg/m   Well appearing, alert and conversant, regular work of breathing,  good skin color Eyes- anicteric, neuro- grossly intact, skin- no apparent rash or lesions or cyanosis, mouth- oral mucosa is pink   Labs/Other Tests and Data Reviewed:    Recent Labs: 08/08/2018: Magnesium 2.1; TSH 4.568 08/09/2018: ALT 26; Hemoglobin 12.1; Platelets 144 08/10/2018: BUN 24; Creatinine, Ser 1.93; Potassium 3.6; Sodium 139   Wt Readings from Last 3 Encounters:  11/07/18 209 lb (94.8 kg)  08/10/18 211 lb 6.7 oz (95.9 kg)  08/08/18 208 lb 15.9 oz (94.8 kg)     Other studies personally reviewed: Additional studies/ records that were reviewed today include: ECG 08/10/2018 personally reviewed Review of the above records today demonstrates: Atrial sensed, ventricular paced   ASSESSMENT & PLAN:    1.  Complete heart block: Status post Saint Jude dual-chamber pacemaker implanted 08/09/2018.  Device functioning appropriately on wound check visit.  He does not have a remote check scheduled.  This Travis [name] need to be scheduled in the future.  2.  Hypertension: Elevated today.  I am unclear as to what his baseline blood pressure is.  He does say that he was quite anxious about his health phone call.  He Travis [name] check his blood pressure over the next few days to see if it comes down.  3.  Hyperlipidemia:  Statin per primary cardiology   COVID 19 screen The patient denies symptoms of COVID 19 at this time.  The importance of social distancing was discussed today.  Follow-up: 9 months   Current medicines are reviewed at length with the patient today.   The patient does not have concerns regarding his medicines.  The following changes were made today:  none  Labs/ tests ordered today include:  No orders of the defined types were placed in this encounter.    Patient Risk:  after full review of this patients clinical status, I feel that they are at moderate risk at this time.  Today, I have spent 13 minutes with the patient with telehealth technology discussing pacemaker, hypertension .    Signed, Nawaal Alling Jorja LoaMartin Shelbia Scinto, MD  11/07/2018 11:33  AM     [facility] [address] [place] [address] [phone] (office) [phone] (fax)

## 2018-11-08 LAB — CUP PACEART REMOTE DEVICE CHECK
Date Time Interrogation Session: 20200513074525
Implantable Lead Implant Date: 20200212
Implantable Lead Implant Date: 20200212
Implantable Lead Location: 753859
Implantable Lead Location: 753860
Implantable Pulse Generator Implant Date: 20200212
Pulse Gen Model: 2272
Pulse Gen Serial Number: 9107114

## 2018-11-22 NOTE — Progress Notes (Signed)
Remote pacemaker transmission.   

## 2018-11-27 ENCOUNTER — Telehealth: Payer: Self-pay

## 2018-11-27 NOTE — Telephone Encounter (Signed)
Spoke with patient to schedule overdue F/U with Dr. Kirke Corin. Offered telehealth visit due to current clinic policies related to COVID 19 precautions. Patient declined-prefers to wait a few months until precautions are lifted. Advised patient recall will be placed for 01/27/2019.

## 2019-01-10 ENCOUNTER — Telehealth: Payer: Self-pay | Admitting: Cardiology

## 2019-01-10 NOTE — Telephone Encounter (Signed)
Pt wanted to know what to do if he go out of town for an extended time period? He wanted to know how often we look at his device? I let the pt know if he is going to be gone for 7 days or more to take his home monitor with him. I let him know that he sends a transmission every 91 days to be charged.  I told him his bedside monitor can send [place] an alert if he has an episode.

## 2019-01-10 NOTE — Telephone Encounter (Signed)
° °  Patient called and had questions about his internal device. Please advise

## 2019-02-05 ENCOUNTER — Other Ambulatory Visit: Payer: Self-pay

## 2019-02-05 MED ORDER — DILTIAZEM HCL ER 120 MG PO CP24: 120.0000 mg | ORAL_CAPSULE | Freq: Every day | ORAL | 0 refills | Status: DC

## 2019-02-05 NOTE — Telephone Encounter (Signed)
*  STAT* If patient is at the pharmacy, call can be transferred to refill team.   1. Which medications need to be refilled? (please list name of each medication and dose if known) Diltiazem  2. Which pharmacy/location (including street and city if local pharmacy) is medication to be sent to? [facility]  3. Do they need a 30 day or 90 day supply? 90   

## 2019-02-06 ENCOUNTER — Ambulatory Visit (INDEPENDENT_AMBULATORY_CARE_PROVIDER_SITE_OTHER): Payer: Medicare Other | Admitting: *Deleted

## 2019-02-06 DIAGNOSIS — I442 Atrioventricular block, complete: Secondary | ICD-10-CM

## 2019-02-06 LAB — CUP PACEART REMOTE DEVICE CHECK
Battery Remaining Longevity: 124 mo
Battery Remaining Percentage: 95.5 %
Battery Voltage: 3.01 V
Brady Statistic AP VP Percent: 30 %
Brady Statistic AP VS Percent: 1 %
Brady Statistic AS VP Percent: 70 %
Brady Statistic AS VS Percent: 1 %
Brady Statistic RA Percent Paced: 29 %
Brady Statistic RV Percent Paced: 99 %
Date Time Interrogation Session: 20200811075634
Implantable Lead Implant Date: 20200212
Implantable Lead Implant Date: 20200212
Implantable Lead Location: 753859
Implantable Lead Location: 753860
Implantable Pulse Generator Implant Date: 20200212
Lead Channel Impedance Value: 430 Ohm
Lead Channel Impedance Value: 550 Ohm
Lead Channel Pacing Threshold Amplitude: 0.5 V
Lead Channel Pacing Threshold Amplitude: 1 V
Lead Channel Pacing Threshold Pulse Width: 0.5 ms
Lead Channel Pacing Threshold Pulse Width: 0.5 ms
Lead Channel Sensing Intrinsic Amplitude: 3.9 mV
Lead Channel Sensing Intrinsic Amplitude: 7.5 mV
Lead Channel Setting Pacing Amplitude: 0.75 V
Lead Channel Setting Pacing Amplitude: 2 V
Lead Channel Setting Pacing Pulse Width: 0.5 ms
Lead Channel Setting Sensing Sensitivity: 6 mV
Pulse Gen Model: 2272
Pulse Gen Serial Number: 9107114

## 2019-02-16 NOTE — Progress Notes (Signed)
Remote pacemaker transmission.   

## 2019-02-26 ENCOUNTER — Other Ambulatory Visit: Payer: Self-pay | Admitting: Cardiovascular Disease

## 2019-03-13 ENCOUNTER — Ambulatory Visit: Payer: Medicare Other | Admitting: Cardiovascular Disease

## 2019-03-29 ENCOUNTER — Ambulatory Visit (INDEPENDENT_AMBULATORY_CARE_PROVIDER_SITE_OTHER): Payer: Medicare Other | Admitting: Nurse Practitioner

## 2019-03-29 ENCOUNTER — Other Ambulatory Visit: Payer: Self-pay

## 2019-03-29 ENCOUNTER — Encounter: Payer: Self-pay | Admitting: Nurse Practitioner

## 2019-03-29 VITALS — BP 124/64 | HR 79 | Ht 70.0 in | Wt 210.8 lb

## 2019-03-29 DIAGNOSIS — I10 Essential (primary) hypertension: Secondary | ICD-10-CM | POA: Diagnosis not present

## 2019-03-29 DIAGNOSIS — I493 Ventricular premature depolarization: Secondary | ICD-10-CM | POA: Diagnosis not present

## 2019-03-29 DIAGNOSIS — I442 Atrioventricular block, complete: Secondary | ICD-10-CM

## 2019-03-29 MED ORDER — BENAZEPRIL-HYDROCHLOROTHIAZIDE 20-12.5 MG PO TABS: 1.0000 | ORAL_TABLET | Freq: Every day | ORAL | 3 refills | Status: DC

## 2019-03-29 NOTE — Patient Instructions (Signed)
Medication Instructions:  Your physician recommends that you continue on your current medications as directed. Please refer to the Current Medication list given to you today.  If you need a refill on your cardiac medications before your next appointment, please call your pharmacy.   Lab work: None ordered  If you have labs (blood work) drawn today and your tests are completely normal, you will receive your results only by: . MyChart Message (if you have MyChart) OR . A paper copy in the mail If you have any lab test that is abnormal or we need to change your treatment, we will call you to review the results.  Testing/Procedures: None ordered   Follow-Up: At [facility], you and your health needs are our priority.  As part of our continuing mission to provide you with exceptional heart care, we have created designated Provider Care Teams.  These Care Teams include your primary Cardiologist (physician) and Advanced Practice Providers (APPs -  Physician Assistants and Nurse Practitioners) who all work together to provide you with the care you need, when you need it. You will need a follow up appointment in 12 months.  Please call our office 2 months in advance to schedule this appointment.  You may see Muhammad Arida, MD or one of the following Advanced Practice Providers on your designated Care Team:   Christopher Berge, NP Ryan Dunn, PA-C . Jacquelyn Visser, PA-C   

## 2019-03-29 NOTE — Progress Notes (Signed)
Office Visit    Patient Name: Travis [name]. Date of Encounter: 03/29/2019  Primary Care Provider:  Marguarite Arbour, MD Primary Cardiologist:  Lorine Bears, MD Primary Electrophysiologist: Carleene Mains, MD   Chief Complaint    85 year old male with a history of complete heart block status post permanent pacemaker in February 2020, PVCs, hypertension, hyperlipidemia, prior stroke, carotid arterial disease, and stage III chronic kidney disease, who presents for cardiology follow-up.  Past Medical History    Past Medical History:  Diagnosis Date  . Allergic rhinitis   . Anemia, unspecified   . Carotid arterial disease (HCC)    mild  . CKD (chronic kidney disease), stage III   . Colonic polyp   . Complete heart block (HCC)    a. 07/2018 s/p SJM Assurity MRI model [id number] (ser # [id number]).  . Diastolic dysfunction    a. 10/2016 Echo: EF 55-60%, no rwma, Gr1 DD. Very mild AS. Triv AI.  Marland Kitchen Dizziness and giddiness   . Family history of adverse reaction to anesthesia   . Heel spur   . Hyperlipidemia   . Hypertension   . Hypothyroidism   . OA (osteoarthritis)   . Presence of permanent cardiac pacemaker 08/09/2018  . PVC's (premature ventricular contractions)   . Stroke (HCC) 07/2016   mini   . Tinea cruris    Past Surgical History:  Procedure Laterality Date  . APPENDECTOMY    . BACK SURGERY    . CIRCUMCISION    . GANGLION CYST EXCISION    . MOHS SURGERY    . PACEMAKER IMPLANT N/A 08/09/2018   Procedure: PACEMAKER IMPLANT;  Surgeon: Regan Lemming, MD;  Location: [facility];  Service: Cardiovascular;  Laterality: N/A;  . PACEMAKER INSERTION  08/09/2018  . TEMPORARY PACEMAKER N/A 08/08/2018   Procedure: TEMPORARY PACEMAKER;  Surgeon: Yvonne Kendall, MD;  Location: [facility] INVASIVE CV LAB;  Service: Cardiovascular;  Laterality: N/A;    Allergies  Allergies  Allergen Reactions  . Morphine And Related   . Phenol     Other reaction(s): Unknown  .  Tetracyclines & Related     Other reaction(s): UNKNOWN    History of Present Illness    85 year old male with the above past medical history including hypertension, hyperlipidemia, prior stroke, carotid arterial disease, PVCs, and stage III chronic kidney disease.  Prior echo in 2018 showed normal LV function with grade 1 diastolic dysfunction.  Very mild aortic stenosis and trivial AI were also noted.  In February 2020, he was admitted to [facility] with lightheadedness and episodic syncope.  He was found to be in complete heart block.  A temporary wire was initially placed and then he was transferred to [facility] where he underwent successful placement of a Saint Jude dual-chamber permanent pacemaker.  He has since been followed closely in device clinic and has been doing well.  He walks his dog every day and has slowly been increasing his distance.  He is somewhat limited by chronic back pain but denies chest pain, dyspnea, palpitations, PND, orthopnea, dizziness, syncope, edema, or early satiety.  He owns a blood pressure cuff but does not routinely check his blood pressure at home.  Home Medications    Prior to Admission medications   Medication Sig Start Date End Date Taking? Authorizing Provider  acetaminophen (TYLENOL) 500 MG tablet Take 500 mg by mouth daily as needed.    [provider]  celecoxib (CELEBREX) 200 MG capsule Take  200 mg by mouth daily.    [provider]  clopidogrel (PLAVIX) 75 MG tablet Take 75 mg by mouth daily.    [provider]  diclofenac sodium (VOLTAREN) 1 % GEL Apply 2 g topically 4 (four) times daily. 07/14/18   [provider]  diltiazem (CARDIZEM CD) 120 MG 24 hr capsule TAKE 1 CAPSULE BY MOUTH ONCE DAILY 02/26/19   Wellington Hampshire, MD  doxazosin (CARDURA) 4 MG tablet Take 4 mg by mouth daily.    [provider]  famotidine (PEPCID) 40 MG tablet Take 40 mg by mouth daily.    [provider]   finasteride (PROSCAR) 5 MG tablet Take 5 mg by mouth daily.    [provider]  fluticasone (FLONASE) 50 MCG/ACT nasal spray Place 1 spray into both nostrils as needed for allergies or rhinitis.    [provider]  levothyroxine (SYNTHROID, LEVOTHROID) 50 MCG tablet Take 50 mcg by mouth daily before breakfast.    [provider]  polyethylene glycol (MIRALAX / GLYCOLAX) packet Take 17 g by mouth daily.    [provider]  potassium chloride SA (K-DUR,KLOR-CON) 20 MEQ tablet Take 20 mEq by mouth 2 (two) times daily.    [provider]  Benazepril HCTZ 20-12.5 mg daily  Review of Systems    Chronic back pain. He denies chest pain, palpitations, dyspnea, pnd, orthopnea, n, v, dizziness, syncope, edema, weight gain, or early satiety.  All other systems reviewed and are otherwise negative except as noted above.  Physical Exam    VS:  BP 124/64 (BP Location: Left Arm, Patient Position: Sitting, Cuff Size: Normal)   Pulse 79   Ht 5\' 10"  (1.778 m)   Wt 210 lb 12 oz (95.6 kg)   SpO2 98%   BMI 30.24 kg/m  , BMI Body mass index is 30.24 kg/m. GEN: Well nourished, well developed, in no acute distress. HEENT: normal. Neck: Supple, no JVD, carotid bruits, or masses. Cardiac: RRR, no murmurs, rubs, or gallops. No clubbing, cyanosis, edema.  Radials/PT 2+ and equal bilaterally.  Respiratory:  Respirations regular and unlabored, clear to auscultation bilaterally. GI: Soft, nontender, nondistended, BS + x 4. MS: no deformity or atrophy. Skin: warm and dry, no rash. Neuro:  Strength and sensation are intact. Psych: Normal affect.  Accessory Clinical Findings    ECG personally reviewed by me today -a sensed, V paced - no acute changes.  Lab Results  Component Value Date   WBC 7.0 08/09/2018   HGB 12.1 (L) 08/09/2018   HCT 37.2 (L) 08/09/2018   MCV 93.5 08/09/2018   PLT 144 (L) 08/09/2018   Lab Results  Component Value Date   CREATININE 1.93 (H)  08/10/2018   BUN 24 (H) 08/10/2018   NA 139 08/10/2018   K 3.6 08/10/2018   CL 108 08/10/2018   CO2 24 08/10/2018   Lab Results  Component Value Date   ALT 26 08/09/2018   AST 24 08/09/2018   ALKPHOS 63 08/09/2018   BILITOT 0.5 08/09/2018     Assessment & Plan    1.  Complete heart block: Status post [facility] Jude dual chamber permanent pacemaker in February.  He has been doing well and has been increasing his activity and walking more at home.  No recurrent presyncope or syncope.  He has been followed closely in device clinic in [place].  2.  PVCs: Quiescent on diltiazem therapy.  3.  Essential hypertension: Stable on diltiazem and benazepril-HCTZ.  4.  Hypothyroidism: Normal TSH in July.  Remains on levothyroxine.  5.  Hyperlipidemia: Followed by primary care with an LDL of 104 in May 2019.  Being managed with diet and exercise.  6.  Disposition: Continue follow-up in device clinic.  Follow-up in 1 year or sooner if necessary.   Nicolasa Duckinghristopher Daltin Crist, NP 03/29/2019, 8:13 AM

## 2019-05-09 ENCOUNTER — Encounter: Payer: Medicare Other | Admitting: *Deleted

## 2019-05-10 ENCOUNTER — Telehealth: Payer: Self-pay

## 2019-05-10 NOTE — Telephone Encounter (Signed)
Left message for patient to remind of missed remote transmission. LL 

## 2019-05-28 ENCOUNTER — Other Ambulatory Visit: Payer: Self-pay | Admitting: Cardiovascular Disease

## 2019-06-11 ENCOUNTER — Ambulatory Visit (INDEPENDENT_AMBULATORY_CARE_PROVIDER_SITE_OTHER): Payer: Medicare Other | Admitting: *Deleted

## 2019-06-11 DIAGNOSIS — I442 Atrioventricular block, complete: Secondary | ICD-10-CM

## 2019-06-12 LAB — CUP PACEART REMOTE DEVICE CHECK
Battery Remaining Longevity: 116 mo
Battery Remaining Percentage: 95.5 %
Battery Voltage: 3.01 V
Brady Statistic AP VP Percent: 30 %
Brady Statistic AP VS Percent: 1 %
Brady Statistic AS VP Percent: 69 %
Brady Statistic AS VS Percent: 1 %
Brady Statistic RA Percent Paced: 29 %
Brady Statistic RV Percent Paced: 99 %
Date Time Interrogation Session: 20201214143050
Implantable Lead Implant Date: 20200212
Implantable Lead Implant Date: 20200212
Implantable Lead Location: 753859
Implantable Lead Location: 753860
Implantable Pulse Generator Implant Date: 20200212
Lead Channel Impedance Value: 400 Ohm
Lead Channel Impedance Value: 460 Ohm
Lead Channel Pacing Threshold Amplitude: 0.5 V
Lead Channel Pacing Threshold Amplitude: 1 V
Lead Channel Pacing Threshold Pulse Width: 0.5 ms
Lead Channel Pacing Threshold Pulse Width: 0.5 ms
Lead Channel Sensing Intrinsic Amplitude: 0.7 mV
Lead Channel Sensing Intrinsic Amplitude: 7.5 mV
Lead Channel Setting Pacing Amplitude: 0.75 V
Lead Channel Setting Pacing Amplitude: 2 V
Lead Channel Setting Pacing Pulse Width: 0.5 ms
Lead Channel Setting Sensing Sensitivity: 6 mV
Pulse Gen Model: 2272
Pulse Gen Serial Number: 9107114

## 2019-09-11 ENCOUNTER — Encounter: Payer: Medicare Other | Admitting: Cardiology

## 2019-09-12 ENCOUNTER — Ambulatory Visit (INDEPENDENT_AMBULATORY_CARE_PROVIDER_SITE_OTHER): Payer: Medicare Other | Admitting: *Deleted

## 2019-09-12 DIAGNOSIS — I442 Atrioventricular block, complete: Secondary | ICD-10-CM

## 2019-09-12 LAB — CUP PACEART REMOTE DEVICE CHECK
Battery Remaining Longevity: 116 mo
Battery Remaining Percentage: 95.5 %
Battery Voltage: 3.01 V
Brady Statistic AP VP Percent: 30 %
Brady Statistic AP VS Percent: 1 %
Brady Statistic AS VP Percent: 70 %
Brady Statistic AS VS Percent: 1 %
Brady Statistic RA Percent Paced: 29 %
Brady Statistic RV Percent Paced: 99 %
Date Time Interrogation Session: 20210317000022
Implantable Lead Implant Date: 20200212
Implantable Lead Implant Date: 20200212
Implantable Lead Location: 753859
Implantable Lead Location: 753860
Implantable Pulse Generator Implant Date: 20200212
Lead Channel Impedance Value: 430 Ohm
Lead Channel Impedance Value: 450 Ohm
Lead Channel Pacing Threshold Amplitude: 0.5 V
Lead Channel Pacing Threshold Amplitude: 1.125 V
Lead Channel Pacing Threshold Pulse Width: 0.5 ms
Lead Channel Pacing Threshold Pulse Width: 0.5 ms
Lead Channel Sensing Intrinsic Amplitude: 4.3 mV
Lead Channel Sensing Intrinsic Amplitude: 7.5 mV
Lead Channel Setting Pacing Amplitude: 0.75 V
Lead Channel Setting Pacing Amplitude: 2.125
Lead Channel Setting Pacing Pulse Width: 0.5 ms
Lead Channel Setting Sensing Sensitivity: 6 mV
Pulse Gen Model: 2272
Pulse Gen Serial Number: 9107114

## 2019-09-13 NOTE — Progress Notes (Signed)
PPM Remote  

## 2019-10-08 ENCOUNTER — Telehealth: Payer: Self-pay | Admitting: Cardiology

## 2019-10-08 NOTE — Telephone Encounter (Signed)
Pt made aware in-person visit preferred. Explained that Dr. Elberta Fortis has not seen him in-office since implant 07/2018.  Pt understands why in person needed and agreeable to seeing [place] Thursday.

## 2019-10-08 NOTE — Telephone Encounter (Signed)
New Message     Pt is calling and is wondering if his appt on Thursday can be Virtual     Please advise

## 2019-10-11 ENCOUNTER — Ambulatory Visit: Payer: Medicare Other | Admitting: Cardiology

## 2019-10-11 ENCOUNTER — Other Ambulatory Visit: Payer: Self-pay

## 2019-10-11 ENCOUNTER — Encounter: Payer: Self-pay | Admitting: Cardiology

## 2019-10-11 VITALS — BP 150/72 | HR 67 | Ht 70.0 in | Wt 217.0 lb

## 2019-10-11 DIAGNOSIS — I442 Atrioventricular block, complete: Secondary | ICD-10-CM

## 2019-10-11 LAB — CUP PACEART INCLINIC DEVICE CHECK
Battery Remaining Longevity: 127 mo
Battery Voltage: 3.01 V
Brady Statistic RA Percent Paced: 29 %
Brady Statistic RV Percent Paced: 99.74 %
Date Time Interrogation Session: 20210415144335
Implantable Lead Implant Date: 20200212
Implantable Lead Implant Date: 20200212
Implantable Lead Location: 753859
Implantable Lead Location: 753860
Implantable Pulse Generator Implant Date: 20200212
Lead Channel Impedance Value: 387.5 Ohm
Lead Channel Impedance Value: 462.5 Ohm
Lead Channel Pacing Threshold Amplitude: 0.5 V
Lead Channel Pacing Threshold Amplitude: 0.75 V
Lead Channel Pacing Threshold Pulse Width: 0.5 ms
Lead Channel Pacing Threshold Pulse Width: 0.5 ms
Lead Channel Sensing Intrinsic Amplitude: 4.7 mV
Lead Channel Setting Pacing Amplitude: 0.75 V
Lead Channel Setting Pacing Amplitude: 1.75 V
Lead Channel Setting Pacing Pulse Width: 0.5 ms
Lead Channel Setting Sensing Sensitivity: 6 mV
Pulse Gen Model: 2272
Pulse Gen Serial Number: 9107114

## 2019-10-11 NOTE — Progress Notes (Signed)
Electrophysiology Office Note   Date:  10/11/2019   ID:  Travis Ellis., DOB 09-29-1929, MRN [id number]  PCP:  Marguarite Arbour, MD  Cardiologist:  Kennith Maes Primary Electrophysiologist:  Regan Lemming, MD    Chief Complaint: pacemaker   History of Present Illness: Travis Knope. is a 86 y.o. male who is being seen today for the evaluation of pacemaker at the request of Marguarite Arbour, MD. Presenting today for electrophysiology evaluation.  He has a history of PVCs, hypertension, aortic stenosis, carotid stenosis, CVA/TIA.  He presented to the hospital February 2020 with complete heart block and is now status post [facility] Jude dual-chamber pacemaker implanted 08/09/2018.  Today, he denies symptoms of palpitations, chest pain, shortness of breath, orthopnea, PND, lower extremity edema, claudication, dizziness, presyncope, syncope, bleeding, or neurologic sequela. The patient is tolerating medications without difficulties.  He continues to feel well.  He has had no issues since his pacemaker was implanted.  He is able to do all of his daily activities without restriction.   Past Medical History:  Diagnosis Date  . Allergic rhinitis   . Anemia, unspecified   . Carotid arterial disease (HCC)    mild  . CKD (chronic kidney disease), stage III   . Colonic polyp   . Complete heart block (HCC)    a. 07/2018 s/p SJM Assurity MRI model [id number] (ser # [id number]).  . Diastolic dysfunction    a. 10/2016 Echo: EF 55-60%, no rwma, Gr1 DD. Very mild AS. Triv AI.  Marland Kitchen Dizziness and giddiness   . Family history of adverse reaction to anesthesia   . Heel spur   . Hyperlipidemia   . Hypertension   . Hypothyroidism   . OA (osteoarthritis)   . Presence of permanent cardiac pacemaker 08/09/2018  . PVC's (premature ventricular contractions)   . Stroke (HCC) 07/2016   mini   . Tinea cruris    Past Surgical History:  Procedure Laterality Date  . APPENDECTOMY    . BACK SURGERY    .  CIRCUMCISION    . GANGLION CYST EXCISION    . MOHS SURGERY    . PACEMAKER IMPLANT N/A 08/09/2018   Procedure: PACEMAKER IMPLANT;  Surgeon: Regan Lemming, MD;  Location: [facility];  Service: Cardiovascular;  Laterality: N/A;  . PACEMAKER INSERTION  08/09/2018  . TEMPORARY PACEMAKER N/A 08/08/2018   Procedure: TEMPORARY PACEMAKER;  Surgeon: Yvonne Kendall, MD;  Location: [facility] INVASIVE CV LAB;  Service: Cardiovascular;  Laterality: N/A;     Current Outpatient Medications  Medication Sig Dispense Refill  . acetaminophen (TYLENOL) 500 MG tablet Take 500 mg by mouth daily as needed.    . benazepril-hydrochlorthiazide (LOTENSIN HCT) 20-12.5 MG tablet Take 1 tablet by mouth daily. 90 tablet 3  . celecoxib (CELEBREX) 200 MG capsule Take 200 mg by mouth daily.    . clopidogrel (PLAVIX) 75 MG tablet Take 75 mg by mouth daily.    . diclofenac sodium (VOLTAREN) 1 % GEL Apply 2 g topically 4 (four) times daily as needed.     . diltiazem (CARDIZEM CD) 120 MG 24 hr capsule TAKE 1 CAPSULE BY MOUTH ONCE DAILY 90 capsule 2  . doxazosin (CARDURA) 4 MG tablet Take 4 mg by mouth daily.    . famotidine (PEPCID) 40 MG tablet Take 40 mg by mouth daily.    . finasteride (PROSCAR) 5 MG tablet Take 5 mg by mouth daily.    . fluticasone (FLONASE) 50  MCG/ACT nasal spray Place 1 spray into both nostrils as needed for allergies or rhinitis.    Marland Kitchen levothyroxine (SYNTHROID, LEVOTHROID) 50 MCG tablet Take 50 mcg by mouth daily before breakfast.    . polyethylene glycol (MIRALAX / GLYCOLAX) packet Take 17 g by mouth daily.    . potassium chloride SA (K-DUR,KLOR-CON) 20 MEQ tablet Take 20 mEq by mouth 2 (two) times daily.     No current facility-administered medications for this visit.    Allergies:   Morphine and related, Phenol, and Tetracyclines & related   Social History:  The patient  reports that he has quit smoking. His smoking use included cigarettes. He quit after 4.00 years of use. He has never  used smokeless tobacco. He reports current alcohol use. He reports that he does not use drugs.   Family History:  The patient's family history includes Dementia in his mother; Hypertension in his mother; Lung cancer in his sister; Myasthenia gravis in his father.    ROS:  Please see the history of present illness.   Otherwise, review of systems is positive for none.   All other systems are reviewed and negative.    PHYSICAL EXAM: VS:  BP (!) 150/72   Pulse 67   Ht 5\' 10"  (1.778 m)   Wt 217 lb (98.4 kg)   BMI 31.14 kg/m  , BMI Body mass index is 31.14 kg/m. GEN: Well nourished, well developed, in no acute distress  HEENT: normal  Neck: no JVD, carotid bruits, or masses Cardiac: RRR; no murmurs, rubs, or gallops,no edema  Respiratory:  clear to auscultation bilaterally, normal work of breathing GI: soft, nontender, nondistended, + BS MS: no deformity or atrophy  Skin: warm and dry, device pocket is well healed Neuro:  Strength and sensation are intact Psych: euthymic mood, full affect  EKG:  EKG is ordered today. Personal review of the ekg ordered shows sinus rhythm, ventricular paced  Device interrogation is reviewed today in detail.  See PaceArt for details.   Recent Labs: No results found for requested labs within last 8760 hours.    Lipid Panel  No results found for: CHOL, TRIG, HDL, CHOLHDL, VLDL, LDLCALC, LDLDIRECT   Wt Readings from Last 3 Encounters:  10/11/19 217 lb (98.4 kg)  03/29/19 210 lb 12 oz (95.6 kg)  11/07/18 209 lb (94.8 kg)      Other studies Reviewed: Additional studies/ records that were reviewed today include: TTE 10/28/2016 Review of the above records today demonstrates:  - Left ventricle: The cavity size was normal. There was moderate  concentric hypertrophy. Systolic function was normal. The  estimated ejection fraction was in the range of 55% to 60%. Wall  motion was normal; there were no regional wall motion  abnormalities.  Doppler parameters are consistent with abnormal  left ventricular relaxation (grade 1 diastolic dysfunction).  - Aortic valve: There was very mild stenosis. There was trivial  regurgitation. Mean gradient (S): 7 mm Hg. Valve area (VTI): 2.54  cm^2.    ASSESSMENT AND PLAN:  1.  Complete heart block: Status post Saint Jude dual-chamber pacemaker implanted 08/09/2018.  Device functioning appropriately.  No changes at this time.  2.  Hypertension: Mildly elevated today but usually well controlled.  No changes.  3.  Hyperlipidemia: Statin per primary cardiology.    Current medicines are reviewed at length with the patient today.   The patient does not have concerns regarding his medicines.  The following changes were made today:  none  Labs/ tests ordered today include:  Orders Placed This Encounter  Procedures  . CUP PACEART INCLINIC DEVICE CHECK  . EKG 12-Lead     Disposition:   FU with Travis [name] 1 year  Signed, Danel Requena Jorja Loa, MD  10/11/2019 2:48 PM     [facility] [address] [place] [address] [phone] (office) [phone] (fax)

## 2019-12-12 ENCOUNTER — Ambulatory Visit (INDEPENDENT_AMBULATORY_CARE_PROVIDER_SITE_OTHER): Payer: Medicare Other | Admitting: *Deleted

## 2019-12-12 DIAGNOSIS — I442 Atrioventricular block, complete: Secondary | ICD-10-CM

## 2019-12-12 LAB — CUP PACEART REMOTE DEVICE CHECK
Battery Remaining Longevity: 124 mo
Battery Remaining Percentage: 95.5 %
Battery Voltage: 3.01 V
Brady Statistic AP VP Percent: 31 %
Brady Statistic AP VS Percent: 1 %
Brady Statistic AS VP Percent: 68 %
Brady Statistic AS VS Percent: 1 %
Brady Statistic RA Percent Paced: 31 %
Brady Statistic RV Percent Paced: 99 %
Date Time Interrogation Session: 20210616020015
Implantable Lead Implant Date: 20200212
Implantable Lead Implant Date: 20200212
Implantable Lead Location: 753859
Implantable Lead Location: 753860
Implantable Pulse Generator Implant Date: 20200212
Lead Channel Impedance Value: 410 Ohm
Lead Channel Impedance Value: 460 Ohm
Lead Channel Pacing Threshold Amplitude: 0.625 V
Lead Channel Pacing Threshold Amplitude: 1 V
Lead Channel Pacing Threshold Pulse Width: 0.5 ms
Lead Channel Pacing Threshold Pulse Width: 0.5 ms
Lead Channel Sensing Intrinsic Amplitude: 4.3 mV
Lead Channel Sensing Intrinsic Amplitude: 7.5 mV
Lead Channel Setting Pacing Amplitude: 0.875
Lead Channel Setting Pacing Amplitude: 2 V
Lead Channel Setting Pacing Pulse Width: 0.5 ms
Lead Channel Setting Sensing Sensitivity: 6 mV
Pulse Gen Model: 2272
Pulse Gen Serial Number: 9107114

## 2019-12-13 NOTE — Progress Notes (Signed)
Remote pacemaker transmission.   

## 2019-12-25 ENCOUNTER — Telehealth: Payer: Self-pay | Admitting: *Deleted

## 2019-12-25 DIAGNOSIS — I4891 Unspecified atrial fibrillation: Secondary | ICD-10-CM

## 2019-12-25 NOTE — Telephone Encounter (Signed)
lmtcb

## 2019-12-25 NOTE — Telephone Encounter (Signed)
-----   Message from Will Jorja Loa, MD sent at 12/14/2019  1:48 PM EDT ----- Normal remote reviewed. Battery and lead parameters stable. AF noted. Will need AF clinic follow up to discuss anticoagulation with hx of CVA.

## 2020-01-01 NOTE — Telephone Encounter (Signed)
Travis [name] is returning [employer]. Please advise.

## 2020-01-01 NOTE — Telephone Encounter (Signed)
The patient has been notified of the result and verbalized understanding.  All questions (if any) were answered. Theresia Majors, RN 01/01/2020 10:31 AM  Referral placed for AF clinic.

## 2020-01-03 ENCOUNTER — Ambulatory Visit (HOSPITAL_COMMUNITY)
Admission: RE | Admit: 2020-01-03 | Discharge: 2020-01-03 | Disposition: A | Discharge status: Home / Self Care | Payer: Medicare Other | Source: Ambulatory Visit | Attending: Physician Assistant | Admitting: Physician Assistant

## 2020-01-03 ENCOUNTER — Encounter (HOSPITAL_COMMUNITY): Payer: Self-pay | Admitting: Physician Assistant

## 2020-01-03 ENCOUNTER — Other Ambulatory Visit: Payer: Self-pay

## 2020-01-03 VITALS — BP 132/82 | HR 83 | Ht 70.0 in | Wt 213.0 lb

## 2020-01-03 DIAGNOSIS — D6869 Other thrombophilia: Secondary | ICD-10-CM

## 2020-01-03 DIAGNOSIS — I480 Paroxysmal atrial fibrillation: Secondary | ICD-10-CM | POA: Diagnosis not present

## 2020-01-03 MED ORDER — APIXABAN 2.5 MG PO TABS: 2.5000 mg | ORAL_TABLET | Freq: Two times a day (BID) | ORAL | 3 refills | Status: DC

## 2020-01-03 NOTE — Progress Notes (Signed)
Electrophysiology TeleHealth Note   Audio telehealth visit is felt to be most appropriate for this patient at this time.  See consent below from today for patient consent regarding telehealth for the Atrial Fibrillation Clinic.    Date:  01/03/2020   ID:  Travis Ellis., DOB [birth year], MRN [id number]  Location: home Provider location: [address] [place], [place] [address] Evaluation Performed: Follow up  PCP:  Marguarite Arbour, MD  Primary Cardiologist: Dr Kirke Corin Primary Electrophysiologist: Dr Elberta Fortis    History of Present Illness: Travis [name]. is a 86 y.o. male with a history of complete heart block status post permanent pacemaker in February 2020, PVCs, hypertension, hyperlipidemia, prior stroke, carotid arterial disease, and stage III chronic kidney disease who presents via audio conferencing for a telehealth follow up visit in the [facility].  The patient is referred by Dr Camnitz/device clinic for new onset atrial fibrillation. In February 2020, he was admitted to [facility] with lightheadedness and episodic syncope.  He was found to be in complete heart block. A temporary wire was initially placed and then he was transferred to [facility] where he underwent successful placement of a Saint Jude dual-chamber permanent pacemaker. Patient was diagnosed with atrial fibrillation on device interrogation 12/25/19 with a 4 minute episode. He denies any symptoms.    Today, he denies symptoms of palpitations, chest pain, shortness of breath, orthopnea, PND, lower extremity edema, claudication, dizziness, presyncope, syncope, bleeding, or neurologic sequela. The patient is tolerating medications without difficulties and is otherwise without complaint today.    Atrial Fibrillation Risk Factors:  he does not have symptoms or diagnosis of sleep apnea. he does not have a history of rheumatic fever.   he has a BMI of Body mass index is 30.56  kg/m.Marland Kitchen Filed Weights   01/03/20 1533  Weight: 96.6 kg    Past Medical History:  Diagnosis Date  . Allergic rhinitis   . Anemia, unspecified   . Carotid arterial disease (HCC)    mild  . CKD (chronic kidney disease), stage III   . Colonic polyp   . Complete heart block (HCC)    a. 07/2018 s/p SJM Assurity MRI model [id number] (ser # [id number]).  . Diastolic dysfunction    a. 10/2016 Echo: EF 55-60%, no rwma, Gr1 DD. Very mild AS. Triv AI.  Marland Kitchen Dizziness and giddiness   . Family history of adverse reaction to anesthesia   . Heel spur   . Hyperlipidemia   . Hypertension   . Hypothyroidism   . OA (osteoarthritis)   . Presence of permanent cardiac pacemaker 08/09/2018  . PVC's (premature ventricular contractions)   . Stroke (HCC) 07/2016   mini   . Tinea cruris    Past Surgical History:  Procedure Laterality Date  . APPENDECTOMY    . BACK SURGERY    . CIRCUMCISION    . GANGLION CYST EXCISION    . MOHS SURGERY    . PACEMAKER IMPLANT N/A 08/09/2018   Procedure: PACEMAKER IMPLANT;  Surgeon: Regan Lemming, MD;  [facility]: [facility] CV LAB;  Service: Cardiovascular;  Laterality: N/A;  . PACEMAKER INSERTION  08/09/2018  . TEMPORARY PACEMAKER N/A 08/08/2018   Procedure: TEMPORARY PACEMAKER;  Surgeon: Yvonne Kendall, MD;  Location: [facility] INVASIVE CV LAB;  Service: Cardiovascular;  Laterality: N/A;     Current Outpatient Medications  Medication Sig Dispense Refill  . acetaminophen (TYLENOL) 500 MG tablet Take 500 mg by  mouth daily as needed.    . benazepril-hydrochlorthiazide (LOTENSIN HCT) 20-12.5 MG tablet Take 1 tablet by mouth daily. 90 tablet 3  . celecoxib (CELEBREX) 200 MG capsule Take 200 mg by mouth daily.    . diclofenac sodium (VOLTAREN) 1 % GEL Apply 2 g topically 4 (four) times daily as needed.     . diltiazem (CARDIZEM CD) 120 MG 24 hr capsule TAKE 1 CAPSULE BY MOUTH ONCE DAILY 90 capsule 2  . doxazosin (CARDURA) 4 MG tablet Take 4 mg by mouth daily.    .  famotidine (PEPCID) 40 MG tablet Take 40 mg by mouth daily.    . finasteride (PROSCAR) 5 MG tablet Take 5 mg by mouth daily.    . fluticasone (FLONASE) 50 MCG/ACT nasal spray Place 1 spray into both nostrils as needed for allergies or rhinitis.    . hydrocortisone 2.5 % cream Apply topically.    Marland Kitchen. levothyroxine (SYNTHROID) 100 MCG tablet Take 100 mcg by mouth daily.    . Multiple Vitamins-Minerals (MULTIVITAMIN ADULTS 50+ PO) Take by mouth.    . polyethylene glycol (MIRALAX / GLYCOLAX) packet Take 17 g by mouth daily.    . potassium chloride SA (K-DUR,KLOR-CON) 20 MEQ tablet Take 20 mEq by mouth 2 (two) times daily.    Marland Kitchen. apixaban (ELIQUIS) 2.5 MG TABS tablet Take 1 tablet (2.5 mg total) by mouth 2 (two) times daily. 60 tablet 3   No current facility-administered medications for this encounter.    Allergies:   Morphine and related, Phenol, and Tetracyclines & related   Social History:  The patient  reports that he has quit smoking. His smoking use included cigarettes. He quit after 4.00 years of use. He has never used smokeless tobacco. He reports current alcohol use of about 2.0 standard drinks of alcohol per week. He reports that he does not use drugs.   Family History:  The patient's  family history includes Dementia in his mother; Hypertension in his mother; Lung cancer in his sister; Myasthenia gravis in his father.    ROS:  Please see the history of present illness.   All other systems are personally reviewed and negative.    Recent Labs: No results found for requested labs within last 8760 hours.  personally reviewed    Echo 10/26/16 demonstrated - Left ventricle: The cavity size was normal. There was moderate  concentric hypertrophy. Systolic function was normal. The  estimated ejection fraction was in the range of 55% to 60%. Wall  motion was normal; there were no regional wall motion  abnormalities. Doppler parameters are consistent with abnormal  left ventricular  relaxation (grade 1 diastolic dysfunction).  - Aortic valve: There was very mild stenosis. There was trivial  regurgitation. Mean gradient (S): 7 mm Hg. Valve area (VTI): 2.54  cm^2.    CHA2DS2-VASc Score = 6  The patient's score is based upon: CHF History: 0 HTN History: 1 Age : 2 Diabetes History: 0 Stroke History: 2 Vascular Disease History: 1 Gender: 0      ASSESSMENT AND PLAN: 1. Paroxysmal Atrial Fibrillation (ICD10:  I48.0) The patient's CHA2DS2-VASc score is 6, indicating a 9.7% annual risk of stroke.   4 minute episode noted on PPM General education about afib provided and questions answered. We also discussed his stroke risk and the risks and benefits of anticoagulation.  Will plan to stop Plavix and start Eliquis 2.5 mg BID (age, Cr 1.8) Recent CBC and bmet in care everywhere reviewed.  2. Secondary Hypercoagulable  State (ICD10:  U8381567) The patient is at significant risk for stroke/thromboembolism based upon his CHA2DS2-VASc Score of 6.  Start Apixaban (Eliquis).   3. CHB S/p PPM, followed by Dr Elberta Fortis and the device clinic.  4. HTN Stable, no changes today.   Follow-up in the AF clinic in one month.   Current medicines are reviewed at length with the patient today.   The patient does not have concerns regarding his medicines.  The following changes were made today:  Stop Plavix, start Eliquis   Labs/ tests ordered today include:  No orders of the defined types were placed in this encounter.   Patient Risk:  after full review of this patients clinical status, I feel that they are at moderate risk at this time.   Today, I have spent 15 minutes with the patient with telehealth technology discussing the above.    Dalia Heading PA-C 01/03/2020 4:15 PM  Afib Clinic [facility] [address] [place], [place] [address] [phone]   I hereby voluntarily request, consent and authorize the Atrial Fibrillation Clinic and its  employed or contracted physicians, physician assistants, nurse practitioners or other licensed health care professionals (the Practitioner), to provide me with telemedicine health care services (the "Services") as deemed necessary by the treating Practitioner. I acknowledge and consent to receive the Services by the Practitioner via telemedicine. I understand that the telemedicine visit will involve communicating with the Practitioner through live audiovisual communication technology and the disclosure of certain medical information by electronic transmission. I acknowledge that I have been given the opportunity to request an in-person assessment or other available alternative prior to the telemedicine visit and am voluntarily participating in the telemedicine visit.   I understand that I have the right to withhold or withdraw my consent to the use of telemedicine in the course of my care at any time, without affecting my right to future care or treatment, and that the Practitioner or I may terminate the telemedicine visit at any time. I understand that I have the right to inspect all information obtained and/or recorded in the course of the telemedicine visit and may receive copies of available information for a reasonable fee.  I understand that some of the potential risks of receiving the Services via telemedicine include:   Delay or interruption in medical evaluation due to technological equipment failure or disruption;  Information transmitted may not be sufficient (e.g. poor resolution of images) to allow for appropriate medical decision making by the Practitioner; and/or  In rare instances, security protocols could fail, causing a breach of personal health information.   Furthermore, I acknowledge that it is my responsibility to provide information about my medical history, conditions and care that is complete and accurate to the best of my ability. I acknowledge that Practitioner's advice,  recommendations, and/or decision may be based on factors not within their control, such as incomplete or inaccurate data provided by me or distortions of diagnostic images or specimens that may result from electronic transmissions. I understand that the practice of medicine is not an exact science and that Practitioner makes no warranties or guarantees regarding treatment outcomes. I acknowledge that I will receive a copy of this consent concurrently upon execution via email to the email address I last provided but may also request a printed copy by calling the office of the Atrial Fibrillation Clinic.  I understand that my insurance will be billed for this visit.   I have read or had this consent  read to me.  I understand the contents of this consent, which adequately explains the benefits and risks of the Services being provided via telemedicine.  I have been provided ample opportunity to ask questions regarding this consent and the Services and have had my questions answered to my satisfaction.  I give my informed consent for the services to be provided through the use of telemedicine in my medical care  By participating in this telemedicine visit I agree to the above.

## 2020-01-07 ENCOUNTER — Ambulatory Visit: Payer: Medicare Other | Admitting: Family

## 2020-01-07 NOTE — Progress Notes (Deleted)
Office Visit    Patient Name: Travis [name]. Date of Encounter: 01/07/2020  Primary Care Provider:  Marguarite Arbour, MD Primary Cardiologist:  Lorine Bears, MD Electrophysiologist:  Will Jorja Loa, MD   Chief Complaint    Travis [name]. is a 86 y.o. male with a hx of *** presents today for ***   Past Medical History    Past Medical History:  Diagnosis Date  . Allergic rhinitis   . Anemia, unspecified   . Carotid arterial disease (HCC)    mild  . CKD (chronic kidney disease), stage III   . Colonic polyp   . Complete heart block (HCC)    a. 07/2018 s/p SJM Assurity MRI model [id number] (ser # [id number]).  . Diastolic dysfunction    a. 10/2016 Echo: EF 55-60%, no rwma, Gr1 DD. Very mild AS. Triv AI.  Travis Kitchen Dizziness and giddiness   . Family history of adverse reaction to anesthesia   . Heel spur   . Hyperlipidemia   . Hypertension   . Hypothyroidism   . OA (osteoarthritis)   . Presence of permanent cardiac pacemaker 08/09/2018  . PVC's (premature ventricular contractions)   . Stroke (HCC) 07/2016   mini   . Tinea cruris    Past Surgical History:  Procedure Laterality Date  . APPENDECTOMY    . BACK SURGERY    . CIRCUMCISION    . GANGLION CYST EXCISION    . MOHS SURGERY    . PACEMAKER IMPLANT N/A 08/09/2018   Procedure: PACEMAKER IMPLANT;  Surgeon: Regan Lemming, MD;  Location: [facility] CV LAB;  Service: Cardiovascular;  Laterality: N/A;  . PACEMAKER INSERTION  08/09/2018  . TEMPORARY PACEMAKER N/A 08/08/2018   Procedure: TEMPORARY PACEMAKER;  Surgeon: Yvonne Kendall, MD;  Location: [facility] INVASIVE CV LAB;  Service: Cardiovascular;  Laterality: N/A;    Allergies  Allergies  Allergen Reactions  . Morphine And Related   . Phenol     Other reaction(s): Unknown  . Tetracyclines & Related     Other reaction(s): UNKNOWN    History of Present Illness    Travis [name]. is a 86 y.o. male with a hx of CHB s/p PPM 07/2018, PVC, HTN, HLD, CVA,  carotid arterial disease, CKDIII*** last seen ***.  February 2020 he was admitted with lightheadedness and found to have CHB. Subsequently transferred to [facility] and PPM placed. Device interrogation 12/25/19 showed 4 minute episode of atrial fibrillation which was asymptomatic.   Seen in atrial fibrillation clinic 01/03/20. Due to his new onset atrial fibrillation and CHADS2VASc score of 6 (agex2, HTN, CVAx2, vascular disease). His plavix was stopped, and he was started on Eliquis 2.5mg  twice daily. Reduced dose due to age and renal function.   ***  EKGs/Labs/Other Studies Reviewed:   The following studies were reviewed today: ***  EKG:  EKG is *** ordered today.  The ekg ordered today demonstrates ***  Recent Labs: No results found for requested labs within last 8760 hours.  Recent Lipid Panel No results found for: CHOL, TRIG, HDL, CHOLHDL, VLDL, LDLCALC, LDLDIRECT  Home Medications   No outpatient medications have been marked as taking for the 01/07/20 encounter (Appointment) with Alver Sorrow, NP.      Review of Systems    ***   ROS All other systems reviewed and are otherwise negative except as noted above.  Physical Exam    VS:  There were no vitals taken for this visit. ,  BMI There is no height or weight on file to calculate BMI. GEN: Well nourished, well developed, in no acute distress. HEENT: normal. Neck: Supple, no JVD, carotid bruits, or masses. Cardiac: ***RRR, no murmurs, rubs, or gallops. No clubbing, cyanosis, edema.  ***Radials/DP/PT 2+ and equal bilaterally.  Respiratory:  ***Respirations regular and unlabored, clear to auscultation bilaterally. GI: Soft, nontender, nondistended, BS + x 4. MS: No deformity or atrophy. Skin: Warm and dry, no rash. Neuro:  Strength and sensation are intact. Psych: Normal affect.  Assessment & Plan    1. PAF -  2. CHB s/p PPM -  3. HTN -   Disposition: Follow up {follow up:15908} with Dr. Kirke Corin or APP   Alver Sorrow, NP 01/07/2020, 7:48 AM

## 2020-01-08 ENCOUNTER — Ambulatory Visit: Payer: Medicare Other | Admitting: Family

## 2020-01-08 ENCOUNTER — Encounter: Payer: Self-pay | Admitting: Family

## 2020-01-08 ENCOUNTER — Other Ambulatory Visit: Payer: Self-pay

## 2020-01-08 VITALS — BP 130/62 | HR 78 | Ht 70.0 in | Wt 212.0 lb

## 2020-01-08 DIAGNOSIS — I10 Essential (primary) hypertension: Secondary | ICD-10-CM

## 2020-01-08 DIAGNOSIS — I442 Atrioventricular block, complete: Secondary | ICD-10-CM | POA: Diagnosis not present

## 2020-01-08 DIAGNOSIS — Z7901 Long term (current) use of anticoagulants: Secondary | ICD-10-CM | POA: Diagnosis not present

## 2020-01-08 DIAGNOSIS — Z950 Presence of cardiac pacemaker: Secondary | ICD-10-CM | POA: Insufficient documentation

## 2020-01-08 DIAGNOSIS — I480 Paroxysmal atrial fibrillation: Secondary | ICD-10-CM | POA: Diagnosis not present

## 2020-01-08 NOTE — Patient Instructions (Signed)
Medication Instructions:  No medication changes today.   *If you need a refill on your cardiac medications before your next appointment, please call your pharmacy*  Lab Work: No lab work today.   Testing/Procedures: Your EKG today shows your pacemaker is functioning appropriately.  Follow-Up: At [facility], you and your health needs are our priority.  As part of our continuing mission to provide you with exceptional heart care, we have created designated Provider Care Teams.  These Care Teams include your primary Cardiologist (physician) and Advanced Practice Providers (APPs -  Physician Assistants and Nurse Practitioners) who all work together to provide you with the care you need, when you need it.  We recommend signing up for the patient portal called "MyChart".  Sign up information is provided on this After Visit Summary.  MyChart is used to connect with patients for Virtual Visits (Telemedicine).  Patients are able to view lab/test results, encounter notes, upcoming appointments, etc.  Non-urgent messages can be sent to your provider as well.   To learn more about what you can do with MyChart, go to [email or web address].    Your next appointment:   6 month(s)  The format for your next appointment:   In Person  Provider:   You may see Lorine Bears, MD or one of the following Advanced Practice Providers on your designated Care Team:    Nicolasa Ducking, NP  Eula Listen, PA-C  Marisue Ivan, PA-C  Gillian Shields, NP

## 2020-01-08 NOTE — Progress Notes (Signed)
Office Visit    Patient Name: Travis [name]. Date of Encounter: 01/08/2020  Primary Care Provider:  Marguarite Arbour, MD Primary Cardiologist:  Lorine Bears, MD Electrophysiologist:  Will Jorja Loa, MD   Chief Complaint    Travis [name]. is a 86 y.o. male with a hx of  CHB s/p PPM 07/2018, PVC, HTN, HLD, CVA, carotid arterial disease, CKDIII presents today for follow-up after addition of Eliquis at his request.  Past Medical History    Past Medical History:  Diagnosis Date  . Allergic rhinitis   . Anemia, unspecified   . Carotid arterial disease (HCC)    mild  . CKD (chronic kidney disease), stage III   . Colonic polyp   . Complete heart block (HCC)    a. 07/2018 s/p SJM Assurity MRI model [id number] (ser # [id number]).  . Diastolic dysfunction    a. 10/2016 Echo: EF 55-60%, no rwma, Gr1 DD. Very mild AS. Triv AI.  Marland Kitchen Dizziness and giddiness   . Family history of adverse reaction to anesthesia   . Heel spur   . Hyperlipidemia   . Hypertension   . Hypothyroidism   . OA (osteoarthritis)   . Presence of permanent cardiac pacemaker 08/09/2018  . PVC's (premature ventricular contractions)   . Stroke (HCC) 07/2016   mini   . Tinea cruris    Past Surgical History:  Procedure Laterality Date  . APPENDECTOMY    . BACK SURGERY    . CIRCUMCISION    . GANGLION CYST EXCISION    . MOHS SURGERY    . PACEMAKER IMPLANT N/A 08/09/2018   Procedure: PACEMAKER IMPLANT;  Surgeon: Regan Lemming, MD;  Location: [facility] CV LAB;  Service: Cardiovascular;  Laterality: N/A;  . PACEMAKER INSERTION  08/09/2018  . TEMPORARY PACEMAKER N/A 08/08/2018   Procedure: TEMPORARY PACEMAKER;  Surgeon: Yvonne Kendall, MD;  Location: [facility] INVASIVE CV LAB;  Service: Cardiovascular;  Laterality: N/A;    Allergies  Allergies  Allergen Reactions  . Morphine And Related   . Phenol     Other reaction(s): Unknown  . Tetracyclines & Related     Other reaction(s): UNKNOWN    History  of Present Illness    Travis [name]. is a 86 y.o. male with a hx of CHB s/p PPM 07/2018, PVC, HTN, HLD, CVA, carotid arterial disease, CKDIII last seen at the atrial fibrillation clinic 01/03/2020.Marland Kitchen  February 2020 he was admitted with lightheadedness and found to have CHB. Subsequently transferred to [facility] and PPM placed. Device interrogation 12/25/19 showed 4 minute episode of atrial fibrillation which was asymptomatic.   Seen in atrial fibrillation clinic 01/03/20. Due to his new onset atrial fibrillation and CHADS2VASc score of 6 (agex2, HTN, CVAx2, vascular disease). His plavix was stopped, and he was started on Eliquis 2.5mg  twice daily. Reduced dose due to age and renal function.   We reviewed finding of atrial fibrillation and new Eliquis.  He denies bleeding complications.  He was worried about coming off of his Plavix but we discussed risk reduction of bleeding while on Eliquis.  We reviewed that he is on reduced dose Eliquis due to age and renal function.  He was appreciative of the discussion.  Denies palpitations, lightheadedness, dizziness.  Reports no chest pain, pressure, tightness.  Reports occasional lower extremity edema.  We discussed prevention by keeping legs elevated when sitting.  EKGs/Labs/Other Studies Reviewed:   The following studies were reviewed today:   EKG:  EKG is  ordered today.  The ekg ordered today demonstrates atrial sensed ventricular paced rhythm at 78 bpm  Recent Labs: No results found for requested labs within last 8760 hours.  Recent Lipid Panel No results found for: CHOL, TRIG, HDL, CHOLHDL, VLDL, LDLCALC, LDLDIRECT  Home Medications   Current Meds  Medication Sig  . acetaminophen (TYLENOL) 500 MG tablet Take 500 mg by mouth daily as needed.  Marland Kitchen apixaban (ELIQUIS) 2.5 MG TABS tablet Take 1 tablet (2.5 mg total) by mouth 2 (two) times daily.  . benazepril-hydrochlorthiazide (LOTENSIN HCT) 20-12.5 MG tablet Take 1 tablet by mouth daily.  . celecoxib  (CELEBREX) 200 MG capsule Take 200 mg by mouth daily.  . diclofenac sodium (VOLTAREN) 1 % GEL Apply 2 g topically 4 (four) times daily as needed.   . diltiazem (CARDIZEM CD) 120 MG 24 hr capsule TAKE 1 CAPSULE BY MOUTH ONCE DAILY  . doxazosin (CARDURA) 4 MG tablet Take 4 mg by mouth daily.  . famotidine (PEPCID) 20 MG tablet Take 20 mg by mouth daily.   . finasteride (PROSCAR) 5 MG tablet Take 5 mg by mouth daily.  . fluticasone (FLONASE) 50 MCG/ACT nasal spray Place 1 spray into both nostrils as needed for allergies or rhinitis.  . hydrocortisone 2.5 % cream Apply topically.  Marland Kitchen levothyroxine (SYNTHROID) 100 MCG tablet Take 100 mcg by mouth daily.  . Multiple Vitamins-Minerals (MULTIVITAMIN ADULTS 50+ PO) Take by mouth.  . polyethylene glycol (MIRALAX / GLYCOLAX) packet Take 17 g by mouth daily.  . potassium chloride SA (K-DUR,KLOR-CON) 20 MEQ tablet Take 20 mEq by mouth 2 (two) times daily.      Review of Systems   Review of Systems  Constitutional: Negative for chills, fever and malaise/fatigue.  Cardiovascular: Positive for leg swelling. Negative for chest pain, dyspnea on exertion, irregular heartbeat, near-syncope, orthopnea, palpitations and syncope.  Respiratory: Negative for cough, shortness of breath and wheezing.   Gastrointestinal: Negative for melena, nausea and vomiting.  Genitourinary: Negative for hematuria.  Neurological: Negative for dizziness, light-headedness and weakness.   All other systems reviewed and are otherwise negative except as noted above.  Physical Exam    VS:  BP 130/62   Pulse 78   Ht 5\' 10"  (1.778 m)   Wt 212 lb (96.2 kg)   SpO2 96%   BMI 30.42 kg/m  , BMI Body mass index is 30.42 kg/m. GEN: Well nourished, well developed, in no acute distress. HEENT: normal. Neck: Supple, no JVD, carotid bruits, or masses. Cardiac: RRR, no murmurs, rubs, or gallops. No clubbing, cyanosis, edema.  Radials/DP/PT 2+ and equal bilaterally.  Respiratory:   Respirations regular and unlabored, clear to auscultation bilaterally. GI: Soft, nontender, nondistended, BS + x 4. MS: No deformity or atrophy. Skin: Warm and dry, no rash. Neuro:  Strength and sensation are intact. Psych: Normal affect.  Assessment & Plan    1. PAF /chronic anticoagulation-noted on device check 6-29/21.  Started on Eliquis 2.5 mg twice daily at atrial fibrillation clinic.  Denies palpitations.  We discussed indication for anticoagulation as his CHA2DS2-VASc is at least 6.  He denies bleeding complications.  Appropriately on reduced dose due to age, renal function. 2. CHB s/p PPM -continue to follow with EP. 3. HTN - BP well controlled. Continue current antihypertensive regimen.  4. Hypothyroidism -continue levothyroxine. 5. HLD -presently managed with diet exercise per PCP.   Disposition: Follow up in 6 month(s) with Dr. 07-23-1995 or APP   Kirke Corin, NP  01/08/2020, 4:28 PM

## 2020-01-13 ENCOUNTER — Other Ambulatory Visit: Payer: Self-pay

## 2020-01-13 ENCOUNTER — Emergency Department
Admission: EM | Admit: 2020-01-13 | Discharge: 2020-01-13 | Disposition: A | Discharge status: Home / Self Care | Payer: Medicare Other | Attending: Emergency Medicine | Admitting: Emergency Medicine

## 2020-01-13 ENCOUNTER — Emergency Department: Payer: Medicare Other

## 2020-01-13 DIAGNOSIS — E039 Hypothyroidism, unspecified: Secondary | ICD-10-CM | POA: Insufficient documentation

## 2020-01-13 DIAGNOSIS — Z7901 Long term (current) use of anticoagulants: Secondary | ICD-10-CM | POA: Diagnosis not present

## 2020-01-13 DIAGNOSIS — I129 Hypertensive chronic kidney disease with stage 1 through stage 4 chronic kidney disease, or unspecified chronic kidney disease: Secondary | ICD-10-CM | POA: Diagnosis not present

## 2020-01-13 DIAGNOSIS — Z87891 Personal history of nicotine dependence: Secondary | ICD-10-CM | POA: Diagnosis not present

## 2020-01-13 DIAGNOSIS — R4701 Aphasia: Secondary | ICD-10-CM | POA: Diagnosis present

## 2020-01-13 DIAGNOSIS — G459 Transient cerebral ischemic attack, unspecified: Secondary | ICD-10-CM | POA: Diagnosis not present

## 2020-01-13 DIAGNOSIS — N183 Chronic kidney disease, stage 3 unspecified: Secondary | ICD-10-CM | POA: Insufficient documentation

## 2020-01-13 DIAGNOSIS — Z79899 Other long term (current) drug therapy: Secondary | ICD-10-CM | POA: Diagnosis not present

## 2020-01-13 LAB — BASIC METABOLIC PANEL
Anion gap: 7 (ref 5–15)
BUN: 26 mg/dL — ABNORMAL HIGH (ref 8–23)
CO2: 28 mmol/L (ref 22–32)
Calcium: 9.3 mg/dL (ref 8.9–10.3)
Chloride: 100 mmol/L (ref 98–111)
Creatinine, Ser: 1.84 mg/dL — ABNORMAL HIGH (ref 0.61–1.24)
GFR calc Af Amer: 37 mL/min — ABNORMAL LOW (ref >60)
GFR calc non Af Amer: 32 mL/min — ABNORMAL LOW (ref >60)
Glucose, Bld: 100 mg/dL — ABNORMAL HIGH (ref 70–99)
Potassium: 4.3 mmol/L (ref 3.5–5.1)
Sodium: 135 mmol/L (ref 135–145)

## 2020-01-13 LAB — CBC WITH DIFFERENTIAL/PLATELET
Abs Immature Granulocytes: 0.02 10*3/uL (ref 0.00–0.07)
Basophils Absolute: 0 10*3/uL (ref 0.0–0.1)
Basophils Relative: 1 %
Eosinophils Absolute: 0.4 10*3/uL (ref 0.0–0.5)
Eosinophils Relative: 8 %
HCT: 38.7 % — ABNORMAL LOW (ref 39.0–52.0)
Hemoglobin: 12.9 g/dL — ABNORMAL LOW (ref 13.0–17.0)
Immature Granulocytes: 0 %
Lymphocytes Relative: 18 %
Lymphs Abs: 1 10*3/uL (ref 0.7–4.0)
MCH: 31.5 pg (ref 26.0–34.0)
MCHC: 33.3 g/dL (ref 30.0–36.0)
MCV: 94.6 fL (ref 80.0–100.0)
Monocytes Absolute: 0.8 10*3/uL (ref 0.1–1.0)
Monocytes Relative: 14 %
Neutro Abs: 3.4 10*3/uL (ref 1.7–7.7)
Neutrophils Relative %: 59 %
Platelets: 131 10*3/uL — ABNORMAL LOW (ref 150–400)
RBC: 4.09 MIL/uL — ABNORMAL LOW (ref 4.22–5.81)
RDW: 12.4 % (ref 11.5–15.5)
WBC: 5.7 10*3/uL (ref 4.0–10.5)
nRBC: 0 % (ref 0.0–0.2)

## 2020-01-13 NOTE — ED Triage Notes (Signed)
Pt comes EMS from home after having an episode of aphasia with his wife. Pt able to have normal conversation now and is neurologically intact. Hx of stroke in 2018/19. Pt bp 209/85. Pt on eliquis. Pt chronic afib.

## 2020-01-13 NOTE — Discharge Instructions (Signed)
Continue taking all of your home medications.  If you have recurrent symptoms including difficulty speaking, vision changes, or weakness on one side of the body, please call 911 and return to the emergency department right away.

## 2020-01-13 NOTE — ED Notes (Signed)
Pt uphooked and walked to bathroom independently. Pt given warm blanket.

## 2020-01-13 NOTE — ED Provider Notes (Signed)
[place]  [facility] Emergency Department Provider Note  ____________________________________________  Time seen: Approximately 10:15 PM  I have reviewed the triage vital signs and the nursing notes.   HISTORY  Chief Complaint Aphasia    HPI Travis EllisGlenn D Dipiero [name] is a 86 y.o. male with a history of CKD, diastolic dysfunction, hypertension who presents to the ED after an episode of aphasia while talking with his family at home.  Lasted about 3 minutes.  No loss of consciousness or confusion, patient has recall of the event.  Subsequently returned to normal.  He denies any vision changes weakness or paresthesia, no headache.  No palpitations chest pain or shortness of breath.  Symptoms were constant without aggravating or alleviating factors and then abruptly resolved after 3 minutes.      Past Medical History:  Diagnosis Date  . Allergic rhinitis   . Anemia, unspecified   . Carotid arterial disease (HCC)    mild  . CKD (chronic kidney disease), stage III   . Colonic polyp   . Complete heart block (HCC)    a. 07/2018 s/p SJM Assurity MRI model [id number] (ser # [id number]).  . Diastolic dysfunction    a. 10/2016 Echo: EF 55-60%, no rwma, Gr1 DD. Very mild AS. Triv AI.  Marland Kitchen. Dizziness and giddiness   . Family history of adverse reaction to anesthesia   . Heel spur   . Hyperlipidemia   . Hypertension   . Hypothyroidism   . OA (osteoarthritis)   . Presence of permanent cardiac pacemaker 08/09/2018  . PVC's (premature ventricular contractions)   . Stroke (HCC) 07/2016   mini   . Tinea cruris      Patient Active Problem List   Diagnosis Date Noted  . Chronic anticoagulation 01/08/2020  . Presence of permanent cardiac pacemaker 01/08/2020  . Paroxysmal atrial fibrillation (HCC) 01/03/2020  . Secondary hypercoagulable state (HCC) 01/03/2020  . Complete heart block (HCC) 08/08/2018  . Lumbar spondylosis 03/22/2017  . Chronic bilateral low back pain without sciatica  03/22/2017  . Facet arthropathy, lumbar 03/22/2017  . PVC (premature ventricular contraction) 11/23/2013  . Hypertension      Past Surgical History:  Procedure Laterality Date  . APPENDECTOMY    . BACK SURGERY    . CIRCUMCISION    . GANGLION CYST EXCISION    . MOHS SURGERY    . PACEMAKER IMPLANT N/A 08/09/2018   Procedure: PACEMAKER IMPLANT;  Surgeon: Regan Lemmingamnitz, Will Martin, MD;  [facility] INV[facility] CV LAB;  Service: Cardiovascular;  Laterality: N/A;  . PACEMAKER INSERTION  08/09/2018  . TEMPORARY PACEMAKER N/A 08/08/2018   Procedure: TEMPORARY PACEMAKER;  Surgeon: Yvonne KendallEnd, Christopher, MD;  Location: [facility] INVASIVE CV LAB;  Service: Cardiovascular;  Laterality: N/A;     Prior to Admission medications   Medication Sig Start Date End Date Taking? Authorizing Provider  acetaminophen (TYLENOL) 500 MG tablet Take 500 mg by mouth daily as needed.    [provider]  apixaban (ELIQUIS) 2.5 MG TABS tablet Take 1 tablet (2.5 mg total) by mouth 2 (two) times daily. 01/03/20   Fenton, Clint R, PA  benazepril-hydrochlorthiazide (LOTENSIN HCT) 20-12.5 MG tablet Take 1 tablet by mouth daily. 03/29/19   Creig HinesBerge, Christopher Ronald, NP  celecoxib (CELEBREX) 200 MG capsule Take 200 mg by mouth daily.    [provider]  diclofenac sodium (VOLTAREN) 1 % GEL Apply 2 g topically 4 (four) times daily as needed.  07/14/18   [provider]  diltiazem (CARDIZEM CD) 120  MG 24 hr capsule TAKE 1 CAPSULE BY MOUTH ONCE DAILY 05/28/19   Iran Ouch, MD  doxazosin (CARDURA) 4 MG tablet Take 4 mg by mouth daily.    [provider]  famotidine (PEPCID) 20 MG tablet Take 20 mg by mouth daily.     [provider]  finasteride (PROSCAR) 5 MG tablet Take 5 mg by mouth daily.    [provider]  fluticasone (FLONASE) 50 MCG/ACT nasal spray Place 1 spray into both nostrils as needed for allergies or rhinitis.    [provider]  hydrocortisone 2.5 % cream Apply  topically. 12/13/19   [provider]  levothyroxine (SYNTHROID) 100 MCG tablet Take 100 mcg by mouth daily. 11/19/19   [provider]  Multiple Vitamins-Minerals (MULTIVITAMIN ADULTS 50+ PO) Take by mouth.    [provider]  polyethylene glycol (MIRALAX / GLYCOLAX) packet Take 17 g by mouth daily.    [provider]  potassium chloride SA (K-DUR,KLOR-CON) 20 MEQ tablet Take 20 mEq by mouth 2 (two) times daily.    [provider]     Allergies Morphine and related, Phenol, and Tetracyclines & related   Family History  Problem Relation Age of Onset  . Hypertension Mother   . Dementia Mother   . Myasthenia gravis Father   . Lung cancer Sister     Social History Social History   Tobacco Use  . Smoking status: Former Smoker    Years: 4.00    Types: Cigarettes  . Smokeless tobacco: Never Used  Vaping Use  . Vaping Use: Never used  Substance Use Topics  . Alcohol use: Yes    Alcohol/week: 2.0 standard drinks    Types: 2 Standard drinks or equivalent per week    Comment: weekly  . Drug use: No    Review of Systems  Constitutional:   No fever or chills.  ENT:   No sore throat. No rhinorrhea. Cardiovascular:   No chest pain or syncope. Respiratory:   No dyspnea or cough. Gastrointestinal:   Negative for abdominal pain, vomiting and diarrhea.  Musculoskeletal:   Negative for focal pain or swelling All other systems reviewed and are negative except as documented above in ROS and HPI.  ____________________________________________   PHYSICAL EXAM:  VITAL SIGNS: ED Triage Vitals  Enc Vitals Group     BP 01/13/20 1913 (!) 209/85     Pulse Rate 01/13/20 1913 76     Resp 01/13/20 1913 18     Temp 01/13/20 1913 98.5 F (36.9 C)     Temp Source 01/13/20 1913 Oral     SpO2 01/13/20 1913 97 %     Weight 01/13/20 1914 211 lb 10.3 oz (96 kg)     Height 01/13/20 1914 5\' 10"  (1.778 m)     Head Circumference --      Peak Flow --       Pain Score 01/13/20 1914 0     Pain Loc --      Pain Edu? --      Excl. in GC? --     Vital signs reviewed, nursing assessments reviewed.   Constitutional:   Alert and oriented. Non-toxic appearance. Eyes:   Conjunctivae are normal. EOMI. PERRL.  No nystagmus, no APD ENT      Head:   Normocephalic and atraumatic.      Nose:   Normal.      Mouth/Throat: Moist mucosa.  No tongue deviation.01/15/20  Neck:   No meningismus. Full ROM. Hematological/Lymphatic/Immunilogical:   No cervical lymphadenopathy. Cardiovascular:   RRR. Symmetric bilateral radial and DP pulses.  No murmurs. Cap refill less than 2 seconds. Respiratory:   Normal respiratory effort without tachypnea/retractions. Breath sounds are clear and equal bilaterally. No wheezes/rales/rhonchi. Gastrointestinal:   Soft and nontender. Non distended. There is no CVA tenderness.  No rebound, rigidity, or guarding.  Musculoskeletal:   Normal range of motion in all extremities. No joint effusions.  No lower extremity tenderness.  No edema. Neurologic:   Normal speech and language.  Cranial nerves II through XII intact Motor grossly intact.  No drift Normal cerebellar function with finger-to-nose, steady gait. No acute focal neurologic deficits are appreciated.  NIH stroke scale 0 Skin:    Skin is warm, dry and intact. No rash noted.  No petechiae, purpura, or bullae.  ____________________________________________    LABS (pertinent positives/negatives) (all labs ordered are listed, but only abnormal results are displayed) Labs Reviewed  BASIC METABOLIC PANEL - Abnormal; Notable for the following components:      Result Value   Glucose, Bld 100 (*)    BUN 26 (*)    Creatinine, Ser 1.84 (*)    GFR calc non Af Amer 32 (*)    GFR calc Af Amer 37 (*)    All other components within normal limits  CBC WITH DIFFERENTIAL/PLATELET - Abnormal; Notable for the following components:   RBC 4.09 (*)    Hemoglobin 12.9 (*)    HCT 38.7  (*)    Platelets 131 (*)    All other components within normal limits   ____________________________________________   EKG  Interpreted by me Ventricular paced rhythm, rate of 71.  Left axis, left bundle branch block.  No acute ischemic changes.  ___________________________________________    RADIOLOGY  CT HEAD WO CONTRAST  Result Date: 01/13/2020 CLINICAL DATA:  Transient episode of aphasia, TIA, hypertension, on Eliquis EXAM: CT HEAD WITHOUT CONTRAST TECHNIQUE: Contiguous axial images were obtained from the base of the skull through the vertex without intravenous contrast. COMPARISON:  CT head 08/20/2016, MRI 09/09/2016 FINDINGS: Brain: No evidence of acute infarction, hemorrhage, hydrocephalus, extra-axial collection or mass lesion/mass effect. Symmetric prominence of the ventricles, cisterns and sulci compatible with parenchymal volume loss. Patchy areas of white matter hypoattenuation are most compatible with chronic microvascular angiopathy. Senescent mineralization of the basal ganglia. Stable benign dural calcifications are similar to priors. Vascular: Atherosclerotic calcification of the carotid siphons and intradural vertebral arteries. No hyperdense vessel. Skull: No calvarial fracture or suspicious osseous lesion. No scalp swelling or hematoma. Sinuses/Orbits: Paranasal sinuses and mastoid air cells are predominantly clear. Bilateral senescent scleral plaques. Included orbits are otherwise unremarkable. Other: None. IMPRESSION: 1. No acute intracranial abnormality. If there is persisting clinical concern for infarct, MRI is more sensitive and specific for early changes of ischemia. 2. Stable parenchymal volume loss and chronic microvascular angiopathy. Electronically Signed   By: Kreg Shropshire M.D.   On: 01/13/2020 19:50    ____________________________________________   PROCEDURES Procedures  ____________________________________________  DIFFERENTIAL DIAGNOSIS   TIA,  intracranial hemorrhage  CLINICAL IMPRESSION / ASSESSMENT AND PLAN / ED COURSE  Medications ordered in the ED: Medications - No data to display  Pertinent labs & imaging results that were available during my care of the patient were reviewed by me and considered in my medical decision making (see chart for details).  Travis [name]. was evaluated in Emergency Department on 01/13/2020 for the symptoms described  in the history of present illness. He was evaluated in the context of the global COVID-19 pandemic, which necessitated consideration that the patient might be at risk for infection with the SARS-CoV-2 virus that causes COVID-19. Institutional protocols and algorithms that pertain to the evaluation of patients at risk for COVID-19 are in a state of rapid change based on information released by regulatory bodies including the CDC and federal and state organizations. These policies and algorithms were followed during the patient's care in the ED.     Patient presents with aphasia, resolved after about 3 minutes, no other neurologic symptoms or other complaints.  Nontoxic and neurologically intact in the ED, exam benign.  hypertensive initially.  CT scan obtained which is negative for intracranial hemorrhage or obvious stroke.  Patient has an MRI conditional pacemaker and is actively being paced.  I feel that with the rapid resolution of his very focal symptoms, further work-up can be deferred to outpatient follow-up with primary care and neurology to which the patient agrees.  No evidence of ACS PE aortic dissection, neck/cranial vascular occlusion or dissection.  Stable for discharge home.      ____________________________________________   FINAL CLINICAL IMPRESSION(S) / ED DIAGNOSES    Final diagnoses:  TIA (transient ischemic attack)     ED Discharge Orders    None      Portions of this note were generated with dragon dictation software. Dictation errors may occur despite  best attempts at proofreading.   Sharman Cheek, MD 01/13/20 2220

## 2020-01-13 NOTE — ED Notes (Signed)
Pt waiting on results from MD. Denies further needs.

## 2020-01-22 ENCOUNTER — Telehealth (HOSPITAL_COMMUNITY): Payer: Self-pay | Admitting: *Deleted

## 2020-01-22 DIAGNOSIS — I480 Paroxysmal atrial fibrillation: Secondary | ICD-10-CM

## 2020-01-22 NOTE — Telephone Encounter (Addendum)
Pt called to update recent health changes with diagnosis of TIA after an ER visit for aphasia that resolved - pt has seen his PCP and having further work-up - states MRI would require stopping pacemaker but pt very clear he would not like to proceed with MRI as results would not change current treatment plan. Pt states PCP does recommend echo - per Jorja Loa PA ok to order echo. PCP also started on crestor and PRN hydralazine for HBP.

## 2020-02-05 ENCOUNTER — Ambulatory Visit (HOSPITAL_COMMUNITY)
Admission: RE | Admit: 2020-02-05 | Discharge: 2020-02-05 | Disposition: A | Discharge status: Home / Self Care | Payer: Medicare Other | Source: Ambulatory Visit | Attending: Physician Assistant | Admitting: Physician Assistant

## 2020-02-05 ENCOUNTER — Other Ambulatory Visit: Payer: Self-pay

## 2020-02-05 ENCOUNTER — Encounter (HOSPITAL_COMMUNITY): Payer: Self-pay | Admitting: Physician Assistant

## 2020-02-05 VITALS — BP 180/82 | HR 61 | Ht 70.0 in | Wt 209.4 lb

## 2020-02-05 DIAGNOSIS — I480 Paroxysmal atrial fibrillation: Secondary | ICD-10-CM | POA: Diagnosis not present

## 2020-02-05 DIAGNOSIS — Z7901 Long term (current) use of anticoagulants: Secondary | ICD-10-CM | POA: Insufficient documentation

## 2020-02-05 DIAGNOSIS — Z713 Dietary counseling and surveillance: Secondary | ICD-10-CM | POA: Diagnosis not present

## 2020-02-05 DIAGNOSIS — Z87891 Personal history of nicotine dependence: Secondary | ICD-10-CM | POA: Insufficient documentation

## 2020-02-05 DIAGNOSIS — Z79890 Hormone replacement therapy (postmenopausal): Secondary | ICD-10-CM | POA: Diagnosis not present

## 2020-02-05 DIAGNOSIS — I779 Disorder of arteries and arterioles, unspecified: Secondary | ICD-10-CM | POA: Diagnosis not present

## 2020-02-05 DIAGNOSIS — N183 Chronic kidney disease, stage 3 unspecified: Secondary | ICD-10-CM | POA: Diagnosis not present

## 2020-02-05 DIAGNOSIS — Z8673 Personal history of transient ischemic attack (TIA), and cerebral infarction without residual deficits: Secondary | ICD-10-CM | POA: Diagnosis not present

## 2020-02-05 DIAGNOSIS — Z79899 Other long term (current) drug therapy: Secondary | ICD-10-CM | POA: Insufficient documentation

## 2020-02-05 DIAGNOSIS — E039 Hypothyroidism, unspecified: Secondary | ICD-10-CM | POA: Insufficient documentation

## 2020-02-05 DIAGNOSIS — I493 Ventricular premature depolarization: Secondary | ICD-10-CM | POA: Insufficient documentation

## 2020-02-05 DIAGNOSIS — E785 Hyperlipidemia, unspecified: Secondary | ICD-10-CM | POA: Insufficient documentation

## 2020-02-05 DIAGNOSIS — Z6830 Body mass index (BMI) 30.0-30.9, adult: Secondary | ICD-10-CM | POA: Insufficient documentation

## 2020-02-05 DIAGNOSIS — I442 Atrioventricular block, complete: Secondary | ICD-10-CM | POA: Insufficient documentation

## 2020-02-05 DIAGNOSIS — I129 Hypertensive chronic kidney disease with stage 1 through stage 4 chronic kidney disease, or unspecified chronic kidney disease: Secondary | ICD-10-CM | POA: Diagnosis not present

## 2020-02-05 DIAGNOSIS — D6869 Other thrombophilia: Secondary | ICD-10-CM | POA: Insufficient documentation

## 2020-02-05 DIAGNOSIS — Z950 Presence of cardiac pacemaker: Secondary | ICD-10-CM | POA: Insufficient documentation

## 2020-02-05 DIAGNOSIS — M1990 Unspecified osteoarthritis, unspecified site: Secondary | ICD-10-CM | POA: Insufficient documentation

## 2020-02-05 DIAGNOSIS — E669 Obesity, unspecified: Secondary | ICD-10-CM | POA: Insufficient documentation

## 2020-02-05 LAB — CBC
HCT: 40.1 % (ref 39.0–52.0)
Hemoglobin: 13.3 g/dL (ref 13.0–17.0)
MCH: 31.7 pg (ref 26.0–34.0)
MCHC: 33.2 g/dL (ref 30.0–36.0)
MCV: 95.7 fL (ref 80.0–100.0)
Platelets: 121 10*3/uL — ABNORMAL LOW (ref 150–400)
RBC: 4.19 MIL/uL — ABNORMAL LOW (ref 4.22–5.81)
RDW: 12.6 % (ref 11.5–15.5)
WBC: 5.4 10*3/uL (ref 4.0–10.5)
nRBC: 0 % (ref 0.0–0.2)

## 2020-02-05 LAB — BASIC METABOLIC PANEL
Anion gap: 12 (ref 5–15)
BUN: 22 mg/dL (ref 8–23)
CO2: 24 mmol/L (ref 22–32)
Calcium: 9.5 mg/dL (ref 8.9–10.3)
Chloride: 101 mmol/L (ref 98–111)
Creatinine, Ser: 1.67 mg/dL — ABNORMAL HIGH (ref 0.61–1.24)
GFR calc Af Amer: 42 mL/min — ABNORMAL LOW (ref >60)
GFR calc non Af Amer: 36 mL/min — ABNORMAL LOW (ref >60)
Glucose, Bld: 91 mg/dL (ref 70–99)
Potassium: 4.1 mmol/L (ref 3.5–5.1)
Sodium: 137 mmol/L (ref 135–145)

## 2020-02-05 NOTE — Progress Notes (Signed)
Primary Care Physician: Marguarite Arbour, MD Primary Cardiologist: Dr Kirke Corin Primary Electrophysiologist: Dr Elberta Fortis Referring Physician: Dr Camnitz/device clinic   Travis [name]. is a 86 y.o. male with a history of complete heart block status post permanent pacemaker in February 2020, PVCs, hypertension, hyperlipidemia, prior stroke, carotid arterial disease, and stage III chronic kidney disease who presents via audio conferencing for a telehealth follow up visit in the [facility]. In February 2020, he was admitted to [facility] with lightheadedness and episodic syncope. He was found to be in complete heart block. A temporary wire was initially placed and then he was transferred to [facility] where he underwent successful placement of a Saint Jude dual-chamber permanent pacemaker. Patient was diagnosed with atrial fibrillation on device interrogation 12/25/19 with a 4 minute episode. He denies any symptoms. Patient is on Eliquis for a CHADS2VASC score of 6.  On follow up today, patient denies any heart racing or palpitations. He denies any bleeding issues on anticoagulation. He was seen at the ED 01/13/20 with 3 minutes of aphasia. CT was unremarkable and was felt to be a TIA. He reports his BP is well controlled at home and he has only taken his PRN hydralazine once.   Today, he denies symptoms of palpitations, chest pain, shortness of breath, orthopnea, PND, lower extremity edema, dizziness, presyncope, syncope, snoring, daytime somnolence, bleeding, or neurologic sequela. The patient is tolerating medications without difficulties and is otherwise without complaint today.    Atrial Fibrillation Risk Factors:  he does not have symptoms or diagnosis of sleep apnea. he does not have a history of rheumatic fever.   he has a BMI of Body mass index is 30.05 kg/m.Marland Kitchen Filed Weights   02/05/20 1102  Weight: 95 kg    Family History  Problem Relation Age  of Onset   Hypertension Mother    Dementia Mother    Myasthenia gravis Father    Lung cancer Sister      Atrial Fibrillation Management history:  Previous antiarrhythmic drugs: none Previous cardioversions: none Previous ablations: none CHADS2VASC score: 6 Anticoagulation history: Eliquis    Past Medical History:  Diagnosis Date   Allergic rhinitis    Anemia, unspecified    Carotid arterial disease (HCC)    mild   CKD (chronic kidney disease), stage III    Colonic polyp    Complete heart block (HCC)    a. 07/2018 s/p SJM Assurity MRI model [id number] (ser # [id number]).   Diastolic dysfunction    a. 10/2016 Echo: EF 55-60%, no rwma, Gr1 DD. Very mild AS. Triv AI.   Dizziness and giddiness    Family history of adverse reaction to anesthesia    Heel spur    Hyperlipidemia    Hypertension    Hypothyroidism    OA (osteoarthritis)    Presence of permanent cardiac pacemaker 08/09/2018   PVC's (premature ventricular contractions)    Stroke (HCC) 07/2016   mini    Tinea cruris    Past Surgical History:  Procedure Laterality Date   APPENDECTOMY     BACK SURGERY     CIRCUMCISION     GANGLION CYST EXCISION     MOHS SURGERY     PACEMAKER IMPLANT N/A 08/09/2018   Procedure: PACEMAKER IMPLANT;  Surgeon: Regan Lemming, MD;  Location: [facility] INVASIVE CV LAB;  Service: Cardiovascular;  Laterality: N/A;   PACEMAKER INSERTION  08/09/2018   TEMPORARY PACEMAKER N/A 08/08/2018   Procedure: TEMPORARY  PACEMAKER;  Surgeon: Yvonne Kendall, MD;  Location: [facility] INVASIVE CV LAB;  Service: Cardiovascular;  Laterality: N/A;    Current Outpatient Medications  Medication Sig Dispense Refill   acetaminophen (TYLENOL) 500 MG tablet Take 500 mg by mouth daily as needed.     apixaban (ELIQUIS) 2.5 MG TABS tablet Take 1 tablet (2.5 mg total) by mouth 2 (two) times daily. 60 tablet 3   benazepril-hydrochlorthiazide (LOTENSIN HCT) 20-12.5 MG tablet Take 1 tablet by  mouth daily. 90 tablet 3   celecoxib (CELEBREX) 200 MG capsule Take 200 mg by mouth daily.     diclofenac sodium (VOLTAREN) 1 % GEL Apply 2 g topically 4 (four) times daily as needed.      diltiazem (CARDIZEM CD) 120 MG 24 hr capsule TAKE 1 CAPSULE BY MOUTH ONCE DAILY 90 capsule 2   doxazosin (CARDURA) 4 MG tablet Take 4 mg by mouth daily.     famotidine (PEPCID) 20 MG tablet Take 20 mg by mouth daily.      finasteride (PROSCAR) 5 MG tablet Take 5 mg by mouth daily.     fluticasone (FLONASE) 50 MCG/ACT nasal spray Place 1 spray into both nostrils as needed for allergies or rhinitis.     hydrALAZINE (APRESOLINE) 10 MG tablet Take 10 mg by mouth 2 (two) times daily.     hydrocortisone 2.5 % cream Apply topically.     levothyroxine (SYNTHROID) 100 MCG tablet Take 100 mcg by mouth daily.     Multiple Vitamins-Minerals (MULTIVITAMIN ADULTS 50+ PO) Take by mouth.     polyethylene glycol (MIRALAX / GLYCOLAX) packet Take 17 g by mouth daily.     potassium chloride SA (K-DUR,KLOR-CON) 20 MEQ tablet Take 20 mEq by mouth 2 (two) times daily.     rosuvastatin (CRESTOR) 10 MG tablet Take 10 mg by mouth at bedtime.     No current facility-administered medications for this encounter.    Allergies  Allergen Reactions   Morphine And Related    Phenol     Other reaction(s): Unknown   Tetracyclines & Related     Other reaction(s): UNKNOWN    Social History   Socioeconomic History   Marital status: Married    Spouse name: Not on file   Number of children: Not on file   Years of education: Not on file   Highest education level: Not on file  Occupational History   Not on file  Tobacco Use   Smoking status: Former Smoker    Years: 4.00    Types: Cigarettes   Smokeless tobacco: Never Used  Vaping Use   Vaping Use: Never used  Substance and Sexual Activity   Alcohol use: Yes    Alcohol/week: 2.0 standard drinks    Types: 2 Standard drinks or equivalent per week     Comment: weekly   Drug use: No   Sexual activity: Not on file  Other Topics Concern   Not on file  Social History Narrative   Not on file   Social Determinants of Health   Financial Resource Strain:    Difficulty of Paying Living Expenses:   Food Insecurity:    Worried About Programme researcher, broadcasting/film/video in the Last Year:    Barista in the Last Year:   Transportation Needs:    Freight forwarder (Medical):    Lack of Transportation (Non-Medical):   Physical Activity:    Days of Exercise per Week:    Minutes of Exercise per Session:  Stress:    Feeling of Stress :   Social Connections:    Frequency of Communication with Friends and Family:    Frequency of Social Gatherings with Friends and Family:    Attends Religious Services:    Active Member of Clubs or Organizations:    Attends Engineer, structural:    Marital Status:   Intimate Partner Violence:    Fear of Current or Ex-Partner:    Emotionally Abused:    Physically Abused:    Sexually Abused:      ROS- All systems are reviewed and negative except as per the HPI above.  Physical Exam: Vitals:   02/05/20 1102  BP: (!) 180/82  Pulse: 61  Weight: 95 kg  Height: 5\' 10"  (1.778 m)    GEN- The patient is well appearing elderly male, alert and oriented x 3 today.   HEENT-head normocephalic, atraumatic, sclera clear, conjunctiva pink, hearing intact, trachea midline. Lungs- Clear to ausculation bilaterally, normal work of breathing Heart- Regular rate and rhythm, no murmurs, rubs or gallops  GI- soft, NT, ND, + BS Extremities- no clubbing, cyanosis, or edema MS- no significant deformity or atrophy Skin- no rash or lesion Psych- euthymic mood, full affect Neuro- strength and sensation are intact   Wt Readings from Last 3 Encounters:  02/05/20 95 kg  01/13/20 96 kg  01/08/20 96.2 kg    EKG today demonstrates A sense V paced rhythm HR 61, QRS 180, QTc 491  Echo 10/26/16  demonstrated  - Left ventricle: The cavity size was normal. There was moderate  concentric hypertrophy. Systolic function was normal. The  estimated ejection fraction was in the range of 55% to 60%. Wall  motion was normal; there were no regional wall motion  abnormalities. Doppler parameters are consistent with abnormal  left ventricular relaxation (grade 1 diastolic dysfunction).  - Aortic valve: There was very mild stenosis. There was trivial  regurgitation. Mean gradient (S): 7 mm Hg. Valve area (VTI): 2.54  cm^2.   Epic records are reviewed at length today  CHA2DS2-VASc Score = 6  The patient's score is based upon: CHF History: 0 HTN History: 1 Age : 2 Diabetes History: 0 Stroke History: 2 Vascular Disease History: 1 Gender: 0      ASSESSMENT AND PLAN: 1. Paroxysmal Atrial Fibrillation (ICD10:  I48.0) The patient's CHA2DS2-VASc score is 6, indicating a 9.7% annual risk of stroke.   Patient appears to be maintaining SR. Continue Eliquis 2.5 mg BID Continue diltiazem 120 mg daily Check bmet/CBC  2. Secondary Hypercoagulable State (ICD10:  D68.69) The patient is at significant risk for stroke/thromboembolism based upon his CHA2DS2-VASc Score of 6.  Continue Apixaban (Eliquis).   3. Obesity Body mass index is 30.05 kg/m. Lifestyle modification was discussed at length including regular exercise and weight reduction.  4. CHB S/p PPM, followed by Dr 12/26/16 and the device clinic.  5. HTN Elevated today, better on recheck. He reports good control by his home BP machine. No changes today.    Follow up with Dr Elberta Fortis as scheduled and Dr Kirke Corin per recall. Patient prefers to be followed in [facility] AF clinic as needed.    [place] PA-C Afib [facility] [address] [place], [place] [phone] 02/05/2020 5:45 PM

## 2020-02-21 ENCOUNTER — Other Ambulatory Visit: Payer: Self-pay

## 2020-02-21 ENCOUNTER — Ambulatory Visit (INDEPENDENT_AMBULATORY_CARE_PROVIDER_SITE_OTHER): Payer: Medicare Other

## 2020-02-21 DIAGNOSIS — I480 Paroxysmal atrial fibrillation: Secondary | ICD-10-CM | POA: Diagnosis not present

## 2020-02-21 LAB — ECHOCARDIOGRAM COMPLETE
AR max vel: 2.62 cm2
AV Area VTI: 2.89 cm2
AV Area mean vel: 2.82 cm2
AV Mean grad: 5 mmHg
AV Peak grad: 10.2 mmHg
Ao pk vel: 1.6 m/s
Area-P 1/2: 4.33 cm2
Calc EF: 62.3 %
S' Lateral: 2.4 cm
Single Plane A2C EF: 65.5 %
Single Plane A4C EF: 57.2 %

## 2020-02-22 ENCOUNTER — Encounter (HOSPITAL_COMMUNITY): Payer: Self-pay | Admitting: *Deleted

## 2020-02-25 ENCOUNTER — Other Ambulatory Visit: Payer: Self-pay | Admitting: Cardiovascular Disease

## 2020-03-12 ENCOUNTER — Ambulatory Visit (INDEPENDENT_AMBULATORY_CARE_PROVIDER_SITE_OTHER): Payer: Medicare Other | Admitting: *Deleted

## 2020-03-12 DIAGNOSIS — I442 Atrioventricular block, complete: Secondary | ICD-10-CM

## 2020-03-12 LAB — CUP PACEART REMOTE DEVICE CHECK
Battery Remaining Longevity: 123 mo
Battery Remaining Percentage: 95.5 %
Battery Voltage: 3.01 V
Brady Statistic AP VP Percent: 32 %
Brady Statistic AP VS Percent: 1 %
Brady Statistic AS VP Percent: 67 %
Brady Statistic AS VS Percent: 1 %
Brady Statistic RA Percent Paced: 32 %
Brady Statistic RV Percent Paced: 99 %
Date Time Interrogation Session: 20210915020013
Implantable Lead Implant Date: 20200212
Implantable Lead Implant Date: 20200212
Implantable Lead Location: 753859
Implantable Lead Location: 753860
Implantable Pulse Generator Implant Date: 20200212
Lead Channel Impedance Value: 400 Ohm
Lead Channel Impedance Value: 460 Ohm
Lead Channel Pacing Threshold Amplitude: 0.625 V
Lead Channel Pacing Threshold Amplitude: 0.75 V
Lead Channel Pacing Threshold Pulse Width: 0.5 ms
Lead Channel Pacing Threshold Pulse Width: 0.5 ms
Lead Channel Sensing Intrinsic Amplitude: 4.9 mV
Lead Channel Sensing Intrinsic Amplitude: 7.5 mV
Lead Channel Setting Pacing Amplitude: 0.875
Lead Channel Setting Pacing Amplitude: 1.75 V
Lead Channel Setting Pacing Pulse Width: 0.5 ms
Lead Channel Setting Sensing Sensitivity: 6 mV
Pulse Gen Model: 2272
Pulse Gen Serial Number: 9107114

## 2020-03-13 NOTE — Progress Notes (Signed)
Remote pacemaker transmission.   

## 2020-03-29 ENCOUNTER — Other Ambulatory Visit (HOSPITAL_COMMUNITY): Payer: Self-pay | Admitting: Physician Assistant

## 2020-03-31 ENCOUNTER — Ambulatory Visit: Payer: Medicare Other | Attending: Internal Medicine

## 2020-03-31 DIAGNOSIS — Z23 Encounter for immunization: Secondary | ICD-10-CM

## 2020-03-31 NOTE — Progress Notes (Signed)
   [id number]  Name:  Khiry Pasquariello.    MRN: [id number] DOB: 07-29-1929  03/31/2020  Mr. Hiney was observed post Covid-19 immunization for 15 minutes without incident. He was provided with Vaccine Information Sheet and instruction to access the V-Safe system.   Mr. Gildersleeve was instructed to call 911 with any severe reactions post vaccine: Marland Kitchen Difficulty breathing  . Swelling of face and throat  . A fast heartbeat  . A bad rash all over body  . Dizziness and weakness

## 2020-05-27 ENCOUNTER — Other Ambulatory Visit: Payer: Self-pay

## 2020-05-27 MED ORDER — BENAZEPRIL-HYDROCHLOROTHIAZIDE 20-12.5 MG PO TABS: 1.0000 | ORAL_TABLET | Freq: Every day | ORAL | 0 refills | Status: DC

## 2020-06-11 ENCOUNTER — Ambulatory Visit (INDEPENDENT_AMBULATORY_CARE_PROVIDER_SITE_OTHER): Payer: Medicare Other

## 2020-06-11 DIAGNOSIS — I442 Atrioventricular block, complete: Secondary | ICD-10-CM

## 2020-06-11 LAB — CUP PACEART REMOTE DEVICE CHECK
Battery Remaining Longevity: 122 mo
Battery Remaining Percentage: 95.5 %
Battery Voltage: 3.01 V
Brady Statistic AP VP Percent: 33 %
Brady Statistic AP VS Percent: 1 %
Brady Statistic AS VP Percent: 67 %
Brady Statistic AS VS Percent: 1 %
Brady Statistic RA Percent Paced: 32 %
Brady Statistic RV Percent Paced: 99 %
Date Time Interrogation Session: 20211215020012
Implantable Lead Implant Date: 20200212
Implantable Lead Implant Date: 20200212
Implantable Lead Location: 753859
Implantable Lead Location: 753860
Implantable Pulse Generator Implant Date: 20200212
Lead Channel Impedance Value: 390 Ohm
Lead Channel Impedance Value: 440 Ohm
Lead Channel Pacing Threshold Amplitude: 0.625 V
Lead Channel Pacing Threshold Amplitude: 1 V
Lead Channel Pacing Threshold Pulse Width: 0.5 ms
Lead Channel Pacing Threshold Pulse Width: 0.5 ms
Lead Channel Sensing Intrinsic Amplitude: 12 mV
Lead Channel Sensing Intrinsic Amplitude: 4.9 mV
Lead Channel Setting Pacing Amplitude: 0.875
Lead Channel Setting Pacing Amplitude: 2 V
Lead Channel Setting Pacing Pulse Width: 0.5 ms
Lead Channel Setting Sensing Sensitivity: 6 mV
Pulse Gen Model: 2272
Pulse Gen Serial Number: 9107114

## 2020-06-25 NOTE — Progress Notes (Signed)
Remote pacemaker transmission.   

## 2020-07-01 ENCOUNTER — Encounter: Payer: Self-pay | Admitting: Cardiovascular Disease

## 2020-07-01 ENCOUNTER — Ambulatory Visit: Payer: Medicare Other | Admitting: Cardiovascular Disease

## 2020-07-01 ENCOUNTER — Other Ambulatory Visit: Payer: Self-pay

## 2020-07-01 VITALS — BP 140/64 | HR 67 | Ht 71.0 in | Wt 218.5 lb

## 2020-07-01 DIAGNOSIS — I10 Essential (primary) hypertension: Secondary | ICD-10-CM | POA: Diagnosis not present

## 2020-07-01 DIAGNOSIS — I6522 Occlusion and stenosis of left carotid artery: Secondary | ICD-10-CM | POA: Diagnosis not present

## 2020-07-01 DIAGNOSIS — I480 Paroxysmal atrial fibrillation: Secondary | ICD-10-CM

## 2020-07-01 DIAGNOSIS — I350 Nonrheumatic aortic (valve) stenosis: Secondary | ICD-10-CM

## 2020-07-01 DIAGNOSIS — I493 Ventricular premature depolarization: Secondary | ICD-10-CM | POA: Diagnosis not present

## 2020-07-01 MED ORDER — APIXABAN 2.5 MG PO TABS: 2.5000 mg | ORAL_TABLET | Freq: Two times a day (BID) | ORAL | 0 refills | Status: DC

## 2020-07-01 NOTE — Patient Instructions (Signed)
Medication Instructions:  Your physician recommends that you continue on your current medications as directed. Please refer to the Current Medication list given to you today.  A refill for Eliquis has been sent to your pharmacy.  *If you need a refill on your cardiac medications before your next appointment, please call your pharmacy*   Lab Work: None ordered If you have labs (blood work) drawn today and your tests are completely normal, you will receive your results only by: Marland Kitchen MyChart Message (if you have MyChart) OR . A paper copy in the mail If you have any lab test that is abnormal or we need to change your treatment, we will call you to review the results.   Testing/Procedures: None ordered   Follow-Up: At [facility], you and your health needs are our priority.  As part of our continuing mission to provide you with exceptional heart care, we have created designated Provider Care Teams.  These Care Teams include your primary Cardiologist (physician) and Advanced Practice Providers (APPs -  Physician Assistants and Nurse Practitioners) who all work together to provide you with the care you need, when you need it.  We recommend signing up for the patient portal called "MyChart".  Sign up information is provided on this After Visit Summary.  MyChart is used to connect with patients for Virtual Visits (Telemedicine).  Patients are able to view lab/test results, encounter notes, upcoming appointments, etc.  Non-urgent messages can be sent to your provider as well.   To learn more about what you can do with MyChart, go to [email or web address].    Your next appointment:   6 month(s)  The format for your next appointment:   In Person  Provider:   You may see Lorine Bears, MD or one of the following Advanced Practice Providers on your designated Care Team:    Nicolasa Ducking, NP  Eula Listen, PA-C  Marisue Ivan, PA-C  Cadence Alleene, [place]  Gillian Shields,  NP    Other Instructions N/A

## 2020-07-01 NOTE — Progress Notes (Signed)
Cardiology Office Note   Date:  07/01/2020   ID:  Travis Cato., DOB [birth year], MRN [id number]  PCP:  Marguarite Arbour, MD  Cardiologist:   Lorine Bears, MD   Chief Complaint  Patient presents with  . Follow-up    6 month. "doing well."       History of Present Illness: Travis [name]. is a 87 y.o. male who presents for a followup visit regarding PVCs, paroxysmal atrial fibrillation and complete heart block status post permanent pacemaker placement.  He has known history of hypertension, mild aortic stenosis, chronic kidney disease and previous stroke in February 2018. He is a previous smoker.  He had dual-chamber pacemaker placement in February 2020 after presenting with complete heart block. He was found to have atrial fibrillation on device interrogation and since then he has been on low-dose Eliquis. He does have underlying chronic kidney disease. He has been doing well with no chest pain, shortness of breath or palpitations. Unfortunately, his wife died in 02-May-2023. She had advanced dementia. Most recent echocardiogram in August of last year showed an EF of 55 to 60%, aortic sclerosis with no significant stenosis no other significant valvular abnormalities.  Past Medical History:  Diagnosis Date  . Allergic rhinitis   . Anemia, unspecified   . Carotid arterial disease (HCC)    mild  . CKD (chronic kidney disease), stage III (HCC)   . Colonic polyp   . Complete heart block (HCC)    a. 07/2018 s/p SJM Assurity MRI model [id number] (ser # [id number]).  . Diastolic dysfunction    a. 10/2016 Echo: EF 55-60%, no rwma, Gr1 DD. Very mild AS. Triv AI.  Marland Kitchen Dizziness and giddiness   . Family history of adverse reaction to anesthesia   . Heel spur   . Hyperlipidemia   . Hypertension   . Hypothyroidism   . OA (osteoarthritis)   . Presence of permanent cardiac pacemaker 08/09/2018  . PVC's (premature ventricular contractions)   . Stroke (HCC) 07/2016   mini   . Tinea cruris      Past Surgical History:  Procedure Laterality Date  . APPENDECTOMY    . BACK SURGERY    . CIRCUMCISION    . GANGLION CYST EXCISION    . MOHS SURGERY    . PACEMAKER IMPLANT N/A 08/09/2018   Procedure: PACEMAKER IMPLANT;  Surgeon: Regan Lemming, MD;  [facility];  Service: Cardiovascular;  Laterality: N/A;  . PACEMAKER INSERTION  08/09/2018  . TEMPORARY PACEMAKER N/A 08/08/2018   Procedure: TEMPORARY PACEMAKER;  Surgeon: Yvonne Kendall, MD;  Location: [facility] INVASIVE CV LAB;  Service: Cardiovascular;  Laterality: N/A;     Current Outpatient Medications  Medication Sig Dispense Refill  . acetaminophen (TYLENOL) 500 MG tablet Take 500 mg by mouth daily as needed.    Marland Kitchen azelastine (ASTELIN) 0.1 % nasal spray Place into both nostrils.    . benazepril-hydrochlorthiazide (LOTENSIN HCT) 20-12.5 MG tablet Take 1 tablet by mouth daily. 90 tablet 0  . celecoxib (CELEBREX) 200 MG capsule Take 200 mg by mouth daily.    . diclofenac sodium (VOLTAREN) 1 % GEL Apply 2 g topically 4 (four) times daily as needed.     . diltiazem (CARDIZEM CD) 120 MG 24 hr capsule TAKE 1 CAPSULE BY MOUTH ONCE DAILY 90 capsule 1  . doxazosin (CARDURA) 4 MG tablet Take 4 mg by mouth daily.    Marland Kitchen ELIQUIS 2.5 MG TABS tablet TAKE  1 TABLET BY MOUTH TWICE DAILY 60 tablet 3  . famotidine (PEPCID) 20 MG tablet Take 20 mg by mouth daily.     . finasteride (PROSCAR) 5 MG tablet Take 5 mg by mouth daily.    . fluticasone (FLONASE) 50 MCG/ACT nasal spray Place 1 spray into both nostrils as needed for allergies or rhinitis.    . hydrALAZINE (APRESOLINE) 10 MG tablet Take 10 mg by mouth 2 (two) times daily. Take as needed for blood pressure greater than 170/90    . hydrocortisone (ANUSOL-HC) 2.5 % rectal cream Apply topically.    . hydrocortisone 2.5 % cream Apply 1 application topically 2 (two) times daily.    Marland Kitchen levothyroxine (SYNTHROID) 100 MCG tablet Take 100 mcg by mouth daily.    . Multiple  Vitamins-Minerals (MULTIVITAMIN ADULTS 50+ PO) Take by mouth.    . polyethylene glycol (MIRALAX / GLYCOLAX) packet Take 17 g by mouth daily.    . potassium chloride SA (K-DUR,KLOR-CON) 20 MEQ tablet Take 20 mEq by mouth 2 (two) times daily.    . rosuvastatin (CRESTOR) 10 MG tablet Take 10 mg by mouth at bedtime.     No current facility-administered medications for this visit.    Allergies:   Morphine and related, Phenol, and Tetracyclines & related    Social History:  The patient  reports that he has quit smoking. His smoking use included cigarettes. He quit after 4.00 years of use. He has never used smokeless tobacco. He reports current alcohol use of about 2.0 standard drinks of alcohol per week. He reports that he does not use drugs.   Family History:  The patient's family history includes Dementia in his mother; Hypertension in his mother; Lung cancer in his sister; Myasthenia gravis in his father.    ROS:  Please see the history of present illness.   Otherwise, review of systems are positive for none.   All other systems are reviewed and negative.    PHYSICAL EXAM: VS:  BP 140/64 (BP Location: Left Arm, Patient Position: Sitting, Cuff Size: Normal)   Pulse 67   Ht 5\' 11"  (1.803 m)   Wt 218 lb 8 oz (99.1 kg)   SpO2 98%   BMI 30.47 kg/m  , BMI Body mass index is 30.47 kg/m. GEN: Well nourished, well developed, in no acute distress  HEENT: normal  Neck: no JVD, carotid bruits, or masses Cardiac: RRR; no rubs, or gallops,no edema . 1/6 SEM in aortic area.  Respiratory:  clear to auscultation bilaterally, normal work of breathing GI: soft, nontender, nondistended, + BS MS: no deformity or atrophy  Skin: warm and dry, no rash Neuro:  Strength and sensation are intact Psych: euthymic mood, full affect   EKG:  EKG is ordered today. The ekg ordered today demonstrates atrial sensed ventricular paced rhythm. Heart rate is 67 bpm.   Recent Labs: 02/05/2020: BUN 22; Creatinine,  Ser 1.67; Hemoglobin 13.3; Platelets 121; Potassium 4.1; Sodium 137    Lipid Panel No results found for: CHOL, TRIG, HDL, CHOLHDL, VLDL, LDLCALC, LDLDIRECT    Wt Readings from Last 3 Encounters:  07/01/20 218 lb 8 oz (99.1 kg)  02/05/20 209 lb 6.4 oz (95 kg)  01/13/20 211 lb 10.3 oz (96 kg)      No flowsheet data found.    ASSESSMENT AND PLAN:  1. Paroxysmal atrial fibrillation: This was noted on device interrogation and the patient has been on anticoagulation with low-dose Eliquis and statin. No side effects with anticoagulation  he seems to be doing well.  2. Status post dual-chamber pacemaker placement for complete heart block: The device seems to be functioning normally.  3. Essential hypertension: Blood pressure is reasonably controlled.    4. Aortic valve disease: The patient had mild aortic stenosis in the past but most recent echocardiogram showed no significant stenosis.   Disposition:   FU with me in 6 months.  Signed,  Lorine Bears, MD  07/01/2020 1:59 PM    [facility]

## 2020-08-29 ENCOUNTER — Other Ambulatory Visit: Payer: Self-pay | Admitting: Cardiovascular Disease

## 2020-08-29 ENCOUNTER — Other Ambulatory Visit: Payer: Self-pay

## 2020-08-29 MED ORDER — BENAZEPRIL-HYDROCHLOROTHIAZIDE 20-12.5 MG PO TABS: 1.0000 | ORAL_TABLET | Freq: Every day | ORAL | 3 refills | Status: DC

## 2020-09-10 ENCOUNTER — Ambulatory Visit (INDEPENDENT_AMBULATORY_CARE_PROVIDER_SITE_OTHER): Payer: Medicare Other

## 2020-09-10 DIAGNOSIS — I442 Atrioventricular block, complete: Secondary | ICD-10-CM | POA: Diagnosis not present

## 2020-09-11 LAB — CUP PACEART REMOTE DEVICE CHECK
Battery Remaining Longevity: 123 mo
Battery Remaining Percentage: 95.5 %
Battery Voltage: 3.01 V
Brady Statistic AP VP Percent: 36 %
Brady Statistic AP VS Percent: 1 %
Brady Statistic AS VP Percent: 63 %
Brady Statistic AS VS Percent: 1 %
Brady Statistic RA Percent Paced: 34 %
Brady Statistic RV Percent Paced: 99 %
Date Time Interrogation Session: 20220316020016
Implantable Lead Implant Date: 20200212
Implantable Lead Implant Date: 20200212
Implantable Lead Location: 753859
Implantable Lead Location: 753860
Implantable Pulse Generator Implant Date: 20200212
Lead Channel Impedance Value: 390 Ohm
Lead Channel Impedance Value: 450 Ohm
Lead Channel Pacing Threshold Amplitude: 0.625 V
Lead Channel Pacing Threshold Amplitude: 0.75 V
Lead Channel Pacing Threshold Pulse Width: 0.5 ms
Lead Channel Pacing Threshold Pulse Width: 0.5 ms
Lead Channel Sensing Intrinsic Amplitude: 12 mV
Lead Channel Sensing Intrinsic Amplitude: 3.8 mV
Lead Channel Setting Pacing Amplitude: 1 V
Lead Channel Setting Pacing Amplitude: 1.625
Lead Channel Setting Pacing Pulse Width: 0.5 ms
Lead Channel Setting Sensing Sensitivity: 6 mV
Pulse Gen Model: 2272
Pulse Gen Serial Number: 9107114

## 2020-09-17 ENCOUNTER — Telehealth: Payer: Self-pay | Admitting: Cardiology

## 2020-09-17 NOTE — Telephone Encounter (Signed)
Ok to switch 

## 2020-09-17 NOTE — Telephone Encounter (Signed)
Patient requesting to switch from Dr. Elberta Fortis to Dr. Lalla Brothers, because he wants to stay in [place].

## 2020-09-18 NOTE — Progress Notes (Signed)
Remote pacemaker transmission.   

## 2020-09-23 NOTE — Telephone Encounter (Signed)
Called patient and informed him provider switch was approved. He states he will call back to schedule.

## 2020-10-15 ENCOUNTER — Encounter: Payer: Self-pay | Admitting: Cardiology

## 2020-10-15 ENCOUNTER — Other Ambulatory Visit: Payer: Self-pay

## 2020-10-15 ENCOUNTER — Ambulatory Visit (INDEPENDENT_AMBULATORY_CARE_PROVIDER_SITE_OTHER): Payer: Medicare Other | Admitting: Cardiology

## 2020-10-15 VITALS — BP 162/80 | HR 64 | Ht 71.0 in | Wt 206.0 lb

## 2020-10-15 DIAGNOSIS — I10 Essential (primary) hypertension: Secondary | ICD-10-CM

## 2020-10-15 DIAGNOSIS — Z950 Presence of cardiac pacemaker: Secondary | ICD-10-CM | POA: Diagnosis not present

## 2020-10-15 DIAGNOSIS — I442 Atrioventricular block, complete: Secondary | ICD-10-CM

## 2020-10-15 NOTE — Progress Notes (Signed)
Electrophysiology Office Follow up Visit Note:    Date:  10/15/2020   ID:  Travis [name]., DOB 07/22/1929, MRN [id number]  PCP:  Marguarite Arbour, MD  [facility] Cardiologist:  Lorine Bears, MD  [facility] Electrophysiologist:  Will Jorja Loa, MD    Interval History:    Leory [name]. is a 87 y.o. male who presents for a follow up visit. They were last seen in clinic by Dr. Elberta Fortis October 11, 2019.  Dr. Elberta Fortis had previously placed a dual-chamber permanent pacemaker for complete heart block.  Patient tells me he has been doing well.  No complaints related to his pacemaker.  He has previously kept a check on his blood pressure at home periodically and it is always been in the 140s systolic.  He has not checked it recently.  He does have a cuff that works.    Past Medical History:  Diagnosis Date  . Allergic rhinitis   . Anemia, unspecified   . Carotid arterial disease (HCC)    mild  . CKD (chronic kidney disease), stage III (HCC)   . Colonic polyp   . Complete heart block (HCC)    a. 07/2018 s/p SJM Assurity MRI model [id number] (ser # [id number]).  . Diastolic dysfunction    a. 10/2016 Echo: EF 55-60%, no rwma, Gr1 DD. Very mild AS. Triv AI.  Marland Kitchen Dizziness and giddiness   . Family history of adverse reaction to anesthesia   . Heel spur   . Hyperlipidemia   . Hypertension   . Hypothyroidism   . OA (osteoarthritis)   . Presence of permanent cardiac pacemaker 08/09/2018  . PVC's (premature ventricular contractions)   . Stroke (HCC) 07/2016   mini   . Tinea cruris     Past Surgical History:  Procedure Laterality Date  . APPENDECTOMY    . BACK SURGERY    . CIRCUMCISION    . GANGLION CYST EXCISION    . MOHS SURGERY    . PACEMAKER IMPLANT N/A 08/09/2018   Procedure: PACEMAKER IMPLANT;  Surgeon: Regan Lemming, MD;  Location: [facility];  Service: Cardiovascular;  Laterality: N/A;  . PACEMAKER INSERTION  08/09/2018  . TEMPORARY PACEMAKER N/A  08/08/2018   Procedure: TEMPORARY PACEMAKER;  Surgeon: Yvonne Kendall, MD;  Location: [facility] INVASIVE CV LAB;  Service: Cardiovascular;  Laterality: N/A;    Current Medications: Current Meds  Medication Sig  . acetaminophen (TYLENOL) 500 MG tablet Take 500 mg by mouth daily as needed.  Marland Kitchen apixaban (ELIQUIS) 2.5 MG TABS tablet Take 1 tablet (2.5 mg total) by mouth 2 (two) times daily.  Marland Kitchen azelastine (ASTELIN) 0.1 % nasal spray Place into both nostrils.  . benazepril-hydrochlorthiazide (LOTENSIN HCT) 20-12.5 MG tablet Take 1 tablet by mouth daily.  . celecoxib (CELEBREX) 200 MG capsule Take 200 mg by mouth daily.  Marland Kitchen diltiazem (CARDIZEM CD) 120 MG 24 hr capsule TAKE 1 CAPSULE BY MOUTH ONCE DAILY  . doxazosin (CARDURA) 4 MG tablet Take 4 mg by mouth daily.  . famotidine (PEPCID) 20 MG tablet Take 20 mg by mouth daily.   . finasteride (PROSCAR) 5 MG tablet Take 5 mg by mouth daily.  . fluticasone (FLONASE) 50 MCG/ACT nasal spray Place 1 spray into both nostrils as needed for allergies or rhinitis.  . hydrALAZINE (APRESOLINE) 10 MG tablet Take 10 mg by mouth 2 (two) times daily. Take as needed for blood pressure greater than 170/90  . hydrocortisone 2.5 % cream Apply  1 application topically 2 (two) times daily.  Marland Kitchen levothyroxine (SYNTHROID) 100 MCG tablet Take 100 mcg by mouth daily.  . Multiple Vitamins-Minerals (MULTIVITAMIN ADULTS 50+ PO) Take by mouth.  . polyethylene glycol (MIRALAX / GLYCOLAX) packet Take 17 g by mouth daily.  . potassium chloride SA (K-DUR,KLOR-CON) 20 MEQ tablet Take 20 mEq by mouth 2 (two) times daily.  . rosuvastatin (CRESTOR) 10 MG tablet Take 10 mg by mouth at bedtime.     Allergies:   Morphine and related, Phenol, and Tetracyclines & related   Social History   Socioeconomic History  . Marital status: Married    Spouse name: Not on file  . Number of children: Not on file  . Years of education: Not on file  . Highest education level: Not on file  Occupational  History  . Not on file  Tobacco Use  . Smoking status: Former Smoker    Years: 4.00    Types: Cigarettes  . Smokeless tobacco: Never Used  Vaping Use  . Vaping Use: Never used  Substance and Sexual Activity  . Alcohol use: Yes    Alcohol/week: 2.0 standard drinks    Types: 2 Standard drinks or equivalent per week    Comment: weekly  . Drug use: No  . Sexual activity: Not on file  Other Topics Concern  . Not on file  Social History Narrative  . Not on file   Social Determinants of Health   Financial Resource Strain: Not on file  Food Insecurity: Not on file  Transportation Needs: Not on file  Physical Activity: Not on file  Stress: Not on file  Social Connections: Not on file     Family History: The patient's family history includes Dementia in his mother; Hypertension in his mother; Lung cancer in his sister; Myasthenia gravis in his father.  ROS:   Please see the history of present illness.    All other systems reviewed and are negative.  EKGs/Labs/Other Studies Reviewed:    The following studies were reviewed today:  October 15, 2020 device interrogation in clinic personally reviewed Battery longevity 10 years Lead parameters stable Presenting rhythm a sensed/V paced at 64 Underlying rhythm sinus rhythm with complete heart block with no escape at 30 bpm Less than 1% AF burden, longest episode lasting 4 minutes 24 seconds 1.3% PVC burden DDD 60-110  February 21, 2020 echo left ventricular function normal, 55% Right ventricular function normal No significant valvular abnormalities     EKG:  The ekg ordered today demonstrates atrial sensed, ventricular paced rhythm  Recent Labs: 02/05/2020: BUN 22; Creatinine, Ser 1.67; Hemoglobin 13.3; Platelets 121; Potassium 4.1; Sodium 137  Recent Lipid Panel No results found for: CHOL, TRIG, HDL, CHOLHDL, VLDL, LDLCALC, LDLDIRECT  Physical Exam:    VS:  BP (!) 162/80   Pulse 64   Ht 5\' 11"  (1.803 m)   Wt 206 lb  (93.4 kg)   SpO2 98%   BMI 28.73 kg/m     Wt Readings from Last 3 Encounters:  10/15/20 206 lb (93.4 kg)  07/01/20 218 lb 8 oz (99.1 kg)  02/05/20 209 lb 6.4 oz (95 kg)     GEN:  Well nourished, well developed in no acute distress HEENT: Normal NECK: No JVD; No carotid bruits LYMPHATICS: No lymphadenopathy CARDIAC: RRR, no murmurs, rubs, gallops.  Pacemaker pocket well-healed without pain. RESPIRATORY:  Clear to auscultation without rales, wheezing or rhonchi  ABDOMEN: Soft, non-tender, non-distended MUSCULOSKELETAL:  No edema; No deformity  SKIN: Warm and dry NEUROLOGIC:  Alert and oriented x 3 PSYCHIATRIC:  Normal affect   ASSESSMENT:    1. Complete heart block (HCC)   2. Presence of permanent cardiac pacemaker   3. Primary hypertension    PLAN:    In order of problems listed above:  1. Complete heart block post permanent pacemaker implant Device interrogation shows a well-functioning pacemaker.  His ventricular pacing 100% the time.  We will check an echo to make sure he is tolerating the pacing.  2.  Hypertension Above goal today but he tells me at home it has been within range.  He will continue to check it at least once a week at home and let us know if it is consistently greater than 150 mmHg systolic.    Medication Adjustments/Labs and Tests Ordered: Current medicines are reviewed at length with the patient today.  Concerns regarding medicines are outlined above.  Orders Placed This Encounter  Procedures  . EKG 12-Lead  . ECHOCARDIOGRAM COMPLETE   No orders of the defined types were placed in this encounter.    Signed, Steffanie Dunn, MD, [facility], [facility] 10/15/2020 11:31 AM    Electrophysiology [facility]

## 2020-10-15 NOTE — Patient Instructions (Addendum)
Medication Instructions:  Your physician recommends that you continue on your current medications as directed. Please refer to the Current Medication list given to you today. *If you need a refill on your cardiac medications before your next appointment, please call your pharmacy*  Lab Work: None ordered. If you have labs (blood work) drawn today and your tests are completely normal, you will receive your results only by: Marland Kitchen MyChart Message (if you have MyChart) OR . A paper copy in the mail If you have any lab test that is abnormal or we need to change your treatment, we will call you to review the results.  Testing/Procedures: Your physician has requested that you have an echocardiogram. Echocardiography is a painless test that uses sound waves to create images of your heart. It provides your doctor with information about the size and shape of your heart and how well your heart's chambers and valves are working. This procedure takes approximately one hour. There are no restrictions for this procedure.  Please schedule ECHO   Follow-Up: At [facility], you and your health needs are our priority.  As part of our continuing mission to provide you with exceptional heart care, we have created designated Provider Care Teams.  These Care Teams include your primary Cardiologist (physician) and Advanced Practice Providers (APPs -  Physician Assistants and Nurse Practitioners) who all work together to provide you with the care you need, when you need it.  Your next appointment:   Your physician wants you to follow-up in: one year with Dr. Lalla Brothers.   You will receive a reminder letter in the mail two months in advance. If you don't receive a letter, please call our office to schedule the follow-up appointment.  Remote monitoring is used to monitor your Pacemaker from home. This monitoring reduces the number of office visits required to check your device to one time per year. It allows [place] to keep an  eye on the functioning of your device to ensure it is working properly. You are scheduled for a device check from home on 12/10/2020. You may send your transmission at any time that day. If you have a wireless device, the transmission will be sent automatically. After your physician reviews your transmission, you will receive a postcard with your next transmission date.

## 2020-11-04 ENCOUNTER — Ambulatory Visit (INDEPENDENT_AMBULATORY_CARE_PROVIDER_SITE_OTHER): Payer: Medicare Other

## 2020-11-04 ENCOUNTER — Other Ambulatory Visit: Payer: Self-pay

## 2020-11-04 DIAGNOSIS — I442 Atrioventricular block, complete: Secondary | ICD-10-CM | POA: Diagnosis not present

## 2020-11-04 DIAGNOSIS — I10 Essential (primary) hypertension: Secondary | ICD-10-CM

## 2020-11-04 DIAGNOSIS — Z950 Presence of cardiac pacemaker: Secondary | ICD-10-CM

## 2020-11-04 LAB — ECHOCARDIOGRAM COMPLETE
AR max vel: 2.95 cm2
AV Area VTI: 2.99 cm2
AV Area mean vel: 2.81 cm2
AV Mean grad: 5 mmHg
AV Peak grad: 9.1 mmHg
Ao pk vel: 1.51 m/s
Area-P 1/2: 2.19 cm2
Calc EF: 49.7 %
S' Lateral: 3 cm
Single Plane A2C EF: 49.1 %
Single Plane A4C EF: 49.7 %

## 2020-11-27 ENCOUNTER — Telehealth: Payer: Self-pay | Admitting: Cardiovascular Disease

## 2020-11-27 NOTE — Telephone Encounter (Signed)
Patient with diagnosis of afib on Eliquis for anticoagulation.    Procedure: Dental Bridge procedure teeth #9-11 upper left  Date of procedure: TBD  CHA2DS2-VASc Score = 6  This indicates a 9.7% annual risk of stroke. The patient's score is based upon: CHF History: No HTN History: Yes Diabetes History: No Stroke History: Yes Vascular Disease History: Yes      CrCl 30.2 ml/min Platelet count 121  Patient does NOT require pre-op antibiotics for dental procedure.  Per office protocol, patient can hold Eliquis for 1 day prior to procedure.    He should resume Eliquis 24 hours post procedure.

## 2020-11-27 NOTE — Telephone Encounter (Signed)
   [facility] Pre-operative Risk Assessment    Patient Name: Heber Hoog.  DOB: [birth year]  MRN: [id number]   HEARTCARE STAFF: - Please ensure there is not already an duplicate clearance open for this procedure. - Under Visit Info/Reason for Call, type in Other and utilize the format Clearance MM/DD/YY or Clearance TBD. Do not use dashes or single digits. - If request is for dental extraction, please clarify the # of teeth to be extracted. - If the patient is currently at the dentist's office, call Pre-Op APP to address. If the patient is not currently in the dentist office, please route to the Pre-Op pool  Request for surgical clearance:  1. What type of surgery is being performed? Dental Bridge procedure teeth #9-11 upper left  2. When is this surgery scheduled? TBD  3. What type of clearance is required (medical clearance vs. Pharmacy clearance to hold med vs. Both)? both  4. Are there any medications that need to be held prior to surgery and how long? Eliquis instructions   5. Practice name and name of physician performing surgery? [facility]   6. What is the office phone number? [phone]   7.   What is the office fax number? [phone]  8.   Anesthesia type (None, local, MAC, general) ? Not listed    Ace Gins 11/27/2020, 3:56 PM  _________________________________________________________________   (provider comments below)

## 2020-11-28 NOTE — Telephone Encounter (Signed)
   Name: Travis [name].  DOB: Jun 26, 1930  MRN: [id number]   Primary Cardiologist: Lorine Bears, MD  Chart reviewed as part of pre-operative protocol coverage. Patient was contacted 11/28/2020 in reference to pre-operative risk assessment for pending surgery as outlined below.  Bowe Sidor. was last seen on 10/15/20 by Dr. Lalla Brothers.  He has a hx of PPM,PAF, HTN. Since that day, Tyrece Vanterpool. has done well.  Therefore, based on ACC/AHA guidelines, the patient would be at acceptable risk for the planned procedure without further cardiovascular testing.   Per pharmacy review, hold Eliquis 1 day prior to procedure and resume 24 hours post procedure. He verbalized understanding of these instructions.   The patient was advised that if he develops new symptoms prior to surgery to contact our office to arrange for a follow-up visit, and he verbalized understanding.  I will route this recommendation to the requesting party via Epic fax function and remove from pre-op pool. Please call with questions.  Alver Sorrow, NP 11/28/2020, 11:12 AM

## 2020-12-10 ENCOUNTER — Ambulatory Visit (INDEPENDENT_AMBULATORY_CARE_PROVIDER_SITE_OTHER): Payer: Medicare Other

## 2020-12-10 DIAGNOSIS — I442 Atrioventricular block, complete: Secondary | ICD-10-CM

## 2020-12-11 LAB — CUP PACEART REMOTE DEVICE CHECK
Battery Remaining Longevity: 124 mo
Battery Remaining Percentage: 95.5 %
Battery Voltage: 3.01 V
Brady Statistic AP VP Percent: 38 %
Brady Statistic AP VS Percent: 1 %
Brady Statistic AS VP Percent: 62 %
Brady Statistic AS VS Percent: 1 %
Brady Statistic RA Percent Paced: 37 %
Brady Statistic RV Percent Paced: 99 %
Date Time Interrogation Session: 20220615024552
Implantable Lead Implant Date: 20200212
Implantable Lead Implant Date: 20200212
Implantable Lead Location: 753859
Implantable Lead Location: 753860
Implantable Pulse Generator Implant Date: 20200212
Lead Channel Impedance Value: 430 Ohm
Lead Channel Impedance Value: 450 Ohm
Lead Channel Pacing Threshold Amplitude: 0.625 V
Lead Channel Pacing Threshold Amplitude: 0.625 V
Lead Channel Pacing Threshold Pulse Width: 0.5 ms
Lead Channel Pacing Threshold Pulse Width: 0.5 ms
Lead Channel Sensing Intrinsic Amplitude: 12 mV
Lead Channel Sensing Intrinsic Amplitude: 4.9 mV
Lead Channel Setting Pacing Amplitude: 0.875
Lead Channel Setting Pacing Amplitude: 1.625
Lead Channel Setting Pacing Pulse Width: 0.5 ms
Lead Channel Setting Sensing Sensitivity: 6 mV
Pulse Gen Model: 2272
Pulse Gen Serial Number: 9107114

## 2021-01-01 NOTE — Progress Notes (Signed)
Remote pacemaker transmission.   

## 2021-01-06 ENCOUNTER — Ambulatory Visit: Payer: Medicare Other | Admitting: Cardiovascular Disease

## 2021-01-16 ENCOUNTER — Ambulatory Visit: Payer: Medicare Other | Admitting: Cardiovascular Disease

## 2021-01-16 ENCOUNTER — Other Ambulatory Visit: Payer: Self-pay

## 2021-01-16 ENCOUNTER — Encounter: Payer: Self-pay | Admitting: Cardiovascular Disease

## 2021-01-16 VITALS — BP 128/60 | HR 65 | Ht 70.0 in | Wt 203.5 lb

## 2021-01-16 DIAGNOSIS — I480 Paroxysmal atrial fibrillation: Secondary | ICD-10-CM | POA: Diagnosis not present

## 2021-01-16 DIAGNOSIS — I10 Essential (primary) hypertension: Secondary | ICD-10-CM | POA: Diagnosis not present

## 2021-01-16 DIAGNOSIS — I359 Nonrheumatic aortic valve disorder, unspecified: Secondary | ICD-10-CM

## 2021-01-16 NOTE — Patient Instructions (Signed)
Medication Instructions:   Your physician recommends that you continue on your current medications as directed. Please refer to the Current Medication list given to you today.  *If you need a refill on your cardiac medications before your next appointment, please call your pharmacy*   Lab Work:  None ordered  Testing/Procedures:  None ordered   Follow-Up: At [facility], you and your health needs are our priority.  As part of our continuing mission to provide you with exceptional heart care, we have created designated Provider Care Teams.  These Care Teams include your primary Cardiologist (physician) and Advanced Practice Providers (APPs -  Physician Assistants and Nurse Practitioners) who all work together to provide you with the care you need, when you need it.  We recommend signing up for the patient portal called "MyChart".  Sign up information is provided on this After Visit Summary.  MyChart is used to connect with patients for Virtual Visits (Telemedicine).  Patients are able to view lab/test results, encounter notes, upcoming appointments, etc.  Non-urgent messages can be sent to your provider as well.   To learn more about what you can do with MyChart, go to https://www.mychart.com.    Your next appointment:   6 month(s)  The format for your next appointment:   In Person  Provider:   You may see Muhammad Arida, MD or one of the following Advanced Practice Providers on your designated Care Team:   Christopher Berge, NP Ryan Dunn, PA-C Jacquelyn Visser, PA-C Cadence Furth, PA-C   

## 2021-01-16 NOTE — Progress Notes (Signed)
Cardiology Office Note   Date:  01/16/2021   ID:  Travis Shere., DOB [birth year], MRN [id number]  PCP:  Travis Arbour, MD  Cardiologist:   Travis Bears, MD   Chief Complaint  Travis [name] presents with   Other    6 month f/u c/o leg weakness. Meds reviewed verbally with pt.      History of Present Illness: Travis Mccuiston. is a 87 y.o. male who presents for a followup visit regarding PVCs, paroxysmal atrial fibrillation and complete heart block status post permanent pacemaker placement.  He has known history of hypertension, mild aortic stenosis, chronic kidney disease and previous stroke in February 2018. He is a previous smoker.  He had dual-chamber pacemaker placement in February 2020 after presenting with complete heart block. He was found to have atrial fibrillation on device interrogation and since then he has been on low-dose Eliquis. He does have underlying chronic kidney disease.   His wife died in 04/13/2020.  Travis Travis [name] recently sold his house and will be living with his daughter.    He has been doing reasonably well with no recent chest pain or worsening dyspnea.  He did have COVID in June but was treated and recovered completely.  Past Medical History:  Diagnosis Date   Allergic rhinitis    Anemia, unspecified    Carotid arterial disease (HCC)    mild   CKD (chronic kidney disease), stage III (HCC)    Colonic polyp    Complete heart block (HCC)    a. 07/2018 s/p SJM Assurity MRI model [id number] (ser # [id number]).   Diastolic dysfunction    a. 10/2016 Echo: EF 55-60%, no rwma, Gr1 DD. Very mild AS. Triv AI.   Dizziness and giddiness    Family history of adverse reaction to anesthesia    Heel spur    Hyperlipidemia    Hypertension    Hypothyroidism    OA (osteoarthritis)    Presence of permanent cardiac pacemaker 08/09/2018   PVC's (premature ventricular contractions)    Stroke (HCC) 07/2016   mini    Tinea cruris     Past Surgical History:   Procedure Laterality Date   APPENDECTOMY     BACK SURGERY     CIRCUMCISION     GANGLION CYST EXCISION     MOHS SURGERY     PACEMAKER IMPLANT N/A 08/09/2018   Procedure: PACEMAKER IMPLANT;  Surgeon: Regan Lemming, MD;  [facility];  Service: Cardiovascular;  Laterality: N/A;   PACEMAKER INSERTION  08/09/2018   TEMPORARY PACEMAKER N/A 08/08/2018   Procedure: TEMPORARY PACEMAKER;  Surgeon: Yvonne Kendall, MD;  Location: [facility] INVASIVE CV [facility];  Service: Cardiovascular;  Laterality: N/A;     Current Outpatient Medications  Medication Sig Dispense Refill   acetaminophen (TYLENOL) 500 MG tablet Take 500 mg by mouth daily as needed.     apixaban (ELIQUIS) 2.5 MG TABS tablet Take 1 tablet (2.5 mg total) by mouth 2 (two) times daily. 180 tablet 0   azelastine (ASTELIN) 0.1 % nasal spray Place into both nostrils.     benazepril-hydrochlorthiazide (LOTENSIN HCT) 20-12.5 MG tablet Take 1 tablet by mouth daily. 90 tablet 3   celecoxib (CELEBREX) 200 MG capsule Take 200 mg by mouth daily.     diltiazem (CARDIZEM CD) 120 MG 24 hr capsule TAKE 1 CAPSULE BY MOUTH ONCE DAILY 90 capsule 3   doxazosin (CARDURA) 4 MG tablet Take 4 mg by mouth daily.  famotidine (PEPCID) 20 MG tablet Take 20 mg by mouth daily.      finasteride (PROSCAR) 5 MG tablet Take 5 mg by mouth daily.     fluticasone (FLONASE) 50 MCG/ACT nasal spray Place 1 spray into both nostrils as needed for allergies or rhinitis.     hydrALAZINE (APRESOLINE) 10 MG tablet Take 10 mg by mouth 2 (two) times daily. Take as needed for blood pressure greater than 170/90     hydrocortisone 2.5 % cream Apply 1 application topically 2 (two) times daily.     levothyroxine (SYNTHROID) 100 MCG tablet Take 100 mcg by mouth daily.     Multiple Vitamins-Minerals (MULTIVITAMIN ADULTS 50+ PO) Take by mouth.     polyethylene glycol (MIRALAX / GLYCOLAX) packet Take 17 g by mouth daily.     potassium chloride SA (K-DUR,KLOR-CON) 20 MEQ  tablet Take 20 mEq by mouth 2 (two) times daily.     rosuvastatin (CRESTOR) 10 MG tablet Take 10 mg by mouth at bedtime.     No current facility-administered medications for this visit.    Allergies:   Morphine and related, Phenol, and Tetracyclines & related    Social History:  Travis Travis [name]  reports that he has quit smoking. His smoking use included cigarettes. He has never used smokeless tobacco. He reports current alcohol use of about 2.0 standard drinks of alcohol per week. He reports that he does not use drugs.   Family History:  Travis Travis [name]'s family history includes Dementia in his mother; Hypertension in his mother; Lung cancer in his sister; Myasthenia gravis in his father.    ROS:  Please see Travis history of present illness.   Otherwise, review of systems are positive for none.   All other systems are reviewed and negative.    PHYSICAL EXAM: VS:  BP 128/60 (BP Location: Left Arm, Travis [name] Position: Sitting, Cuff Size: Normal)   Pulse 65   Ht 5\' 10"  (1.778 m)   Wt 203 lb 8 oz (92.3 kg)   SpO2 96%   BMI 29.20 kg/m  , BMI Body mass index is 29.2 kg/m. GEN: Well nourished, well developed, in no acute distress  HEENT: normal  Neck: no JVD, carotid bruits, or masses Cardiac: RRR; no rubs, or gallops,no edema . 1/6 SEM in aortic area.  Respiratory:  clear to auscultation bilaterally, normal work of breathing GI: soft, nontender, nondistended, + BS MS: no deformity or atrophy  Skin: warm and dry, no rash Neuro:  Strength and sensation are intact Psych: euthymic mood, full affect   EKG:  EKG is ordered today. Travis ekg ordered today demonstrates atrial sensed ventricular paced rhythm. Heart rate is 65 bpm.   Recent Labs: 02/05/2020: BUN 22; Creatinine, Ser 1.67; Hemoglobin 13.3; Platelets 121; Potassium 4.1; Sodium 137    Lipid Panel No results found for: CHOL, TRIG, HDL, CHOLHDL, VLDL, LDLCALC, LDLDIRECT    Wt Readings from Last 3 Encounters:  01/16/21 203 lb 8 oz (92.3  kg)  10/15/20 206 lb (93.4 kg)  07/01/20 218 lb 8 oz (99.1 kg)      No flowsheet data found.    ASSESSMENT AND PLAN:  1. Paroxysmal atrial fibrillation: He is doing well overall with no palpitations.  He is tolerating low-dose Eliquis with no side effects.  I reviewed his most recent labs which showed stable creatinine of 1.6.  His hemoglobin was stable at 12.3.  Given his age and creatinine above 1.5, we will continue with a 2.5 mg dose of  Eliquis.  2. Status post dual-chamber pacemaker placement for complete heart block: Travis device seems to be functioning normally.  3. Essential hypertension: Blood pressure is well controlled.  4. Aortic valve disease: Mildly calcified aortic valve without significant stenosis.  I personally reviewed his most recent echocardiogram done in May which showed normal LV systolic function with mild mitral regurgitation and calcified aortic valve without significant stenosis.   Disposition:   FU with me in 6 months.  Signed,  Travis Bears, MD  01/16/2021 10:49 AM    [facility]

## 2021-03-11 ENCOUNTER — Ambulatory Visit (INDEPENDENT_AMBULATORY_CARE_PROVIDER_SITE_OTHER): Payer: Medicare Other

## 2021-03-11 DIAGNOSIS — I442 Atrioventricular block, complete: Secondary | ICD-10-CM

## 2021-03-11 LAB — CUP PACEART REMOTE DEVICE CHECK
Battery Remaining Longevity: 94 mo
Battery Remaining Percentage: 78 %
Battery Voltage: 3.01 V
Brady Statistic AP VP Percent: 37 %
Brady Statistic AP VS Percent: 1 %
Brady Statistic AS VP Percent: 62 %
Brady Statistic AS VS Percent: 1 %
Brady Statistic RA Percent Paced: 36 %
Brady Statistic RV Percent Paced: 99 %
Date Time Interrogation Session: 20220914032238
Implantable Lead Implant Date: 20200212
Implantable Lead Implant Date: 20200212
Implantable Lead Location: 753859
Implantable Lead Location: 753860
Implantable Pulse Generator Implant Date: 20200212
Lead Channel Impedance Value: 410 Ohm
Lead Channel Impedance Value: 450 Ohm
Lead Channel Pacing Threshold Amplitude: 0.625 V
Lead Channel Pacing Threshold Amplitude: 0.875 V
Lead Channel Pacing Threshold Pulse Width: 0.5 ms
Lead Channel Pacing Threshold Pulse Width: 0.5 ms
Lead Channel Sensing Intrinsic Amplitude: 11.7 mV
Lead Channel Sensing Intrinsic Amplitude: 4.4 mV
Lead Channel Setting Pacing Amplitude: 0.875
Lead Channel Setting Pacing Amplitude: 1.875
Lead Channel Setting Pacing Pulse Width: 0.5 ms
Lead Channel Setting Sensing Sensitivity: 6 mV
Pulse Gen Model: 2272
Pulse Gen Serial Number: 9107114

## 2021-03-18 NOTE — Progress Notes (Signed)
Remote pacemaker transmission.   

## 2021-04-20 ENCOUNTER — Other Ambulatory Visit: Payer: Self-pay | Admitting: Cardiovascular Disease

## 2021-04-20 NOTE — Telephone Encounter (Signed)
Please review for refill, thanks ! 

## 2021-04-21 NOTE — Telephone Encounter (Signed)
Prescription refill request for Eliquis received. Indication:afib Last office visit:arida 01/16/21 Scr:1.6 12/24/20 Age: 37m Weight:92.3kg

## 2021-06-10 ENCOUNTER — Ambulatory Visit (INDEPENDENT_AMBULATORY_CARE_PROVIDER_SITE_OTHER): Payer: Medicare Other

## 2021-06-10 DIAGNOSIS — I442 Atrioventricular block, complete: Secondary | ICD-10-CM | POA: Diagnosis not present

## 2021-06-10 LAB — CUP PACEART REMOTE DEVICE CHECK
Battery Remaining Longevity: 90 mo
Battery Remaining Percentage: 75 %
Battery Voltage: 3.01 V
Brady Statistic AP VP Percent: 38 %
Brady Statistic AP VS Percent: 1 %
Brady Statistic AS VP Percent: 61 %
Brady Statistic AS VS Percent: 1 %
Brady Statistic RA Percent Paced: 36 %
Brady Statistic RV Percent Paced: 98 %
Date Time Interrogation Session: 20221214030601
Implantable Lead Implant Date: 20200212
Implantable Lead Implant Date: 20200212
Implantable Lead Location: 753859
Implantable Lead Location: 753860
Implantable Pulse Generator Implant Date: 20200212
Lead Channel Impedance Value: 390 Ohm
Lead Channel Impedance Value: 450 Ohm
Lead Channel Pacing Threshold Amplitude: 0.75 V
Lead Channel Pacing Threshold Amplitude: 0.75 V
Lead Channel Pacing Threshold Pulse Width: 0.5 ms
Lead Channel Pacing Threshold Pulse Width: 0.5 ms
Lead Channel Sensing Intrinsic Amplitude: 12 mV
Lead Channel Sensing Intrinsic Amplitude: 4.5 mV
Lead Channel Setting Pacing Amplitude: 1 V
Lead Channel Setting Pacing Amplitude: 1.75 V
Lead Channel Setting Pacing Pulse Width: 0.5 ms
Lead Channel Setting Sensing Sensitivity: 6 mV
Pulse Gen Model: 2272
Pulse Gen Serial Number: 9107114

## 2021-06-19 NOTE — Progress Notes (Signed)
Remote pacemaker transmission.   

## 2021-08-19 ENCOUNTER — Other Ambulatory Visit: Payer: Self-pay | Admitting: Cardiovascular Disease

## 2021-09-09 ENCOUNTER — Ambulatory Visit (INDEPENDENT_AMBULATORY_CARE_PROVIDER_SITE_OTHER): Payer: Medicare Other

## 2021-09-09 DIAGNOSIS — I442 Atrioventricular block, complete: Secondary | ICD-10-CM | POA: Diagnosis not present

## 2021-09-09 DIAGNOSIS — I480 Paroxysmal atrial fibrillation: Secondary | ICD-10-CM

## 2021-09-09 LAB — CUP PACEART REMOTE DEVICE CHECK
Battery Remaining Longevity: 85 mo
Battery Remaining Percentage: 73 %
Battery Voltage: 3.01 V
Brady Statistic AP VP Percent: 37 %
Brady Statistic AP VS Percent: 1 %
Brady Statistic AS VP Percent: 62 %
Brady Statistic AS VS Percent: 1 %
Brady Statistic RA Percent Paced: 35 %
Brady Statistic RV Percent Paced: 99 %
Date Time Interrogation Session: 20230315020013
Implantable Lead Implant Date: 20200212
Implantable Lead Implant Date: 20200212
Implantable Lead Location: 753859
Implantable Lead Location: 753860
Implantable Pulse Generator Implant Date: 20200212
Lead Channel Impedance Value: 360 Ohm
Lead Channel Impedance Value: 430 Ohm
Lead Channel Pacing Threshold Amplitude: 0.625 V
Lead Channel Pacing Threshold Amplitude: 0.75 V
Lead Channel Pacing Threshold Pulse Width: 0.5 ms
Lead Channel Pacing Threshold Pulse Width: 0.5 ms
Lead Channel Sensing Intrinsic Amplitude: 12 mV
Lead Channel Sensing Intrinsic Amplitude: 4.5 mV
Lead Channel Setting Pacing Amplitude: 1 V
Lead Channel Setting Pacing Amplitude: 1.625
Lead Channel Setting Pacing Pulse Width: 0.5 ms
Lead Channel Setting Sensing Sensitivity: 6 mV
Pulse Gen Model: 2272
Pulse Gen Serial Number: 9107114

## 2021-09-21 NOTE — Progress Notes (Signed)
Remote pacemaker transmission.   

## 2021-10-08 ENCOUNTER — Ambulatory Visit: Payer: Medicare Other | Admitting: Physician Assistant

## 2021-10-08 ENCOUNTER — Encounter: Payer: Self-pay | Admitting: Physician Assistant

## 2021-10-08 VITALS — BP 122/58 | HR 69 | Ht 70.0 in | Wt 214.0 lb

## 2021-10-08 DIAGNOSIS — I442 Atrioventricular block, complete: Secondary | ICD-10-CM | POA: Diagnosis not present

## 2021-10-08 DIAGNOSIS — I480 Paroxysmal atrial fibrillation: Secondary | ICD-10-CM

## 2021-10-08 DIAGNOSIS — I10 Essential (primary) hypertension: Secondary | ICD-10-CM

## 2021-10-08 DIAGNOSIS — Z950 Presence of cardiac pacemaker: Secondary | ICD-10-CM

## 2021-10-08 DIAGNOSIS — I359 Nonrheumatic aortic valve disorder, unspecified: Secondary | ICD-10-CM

## 2021-10-08 NOTE — Patient Instructions (Signed)
Medication Instructions:  ?No changes at this time. ? ?*If you need a refill on your cardiac medications before your next appointment, please call your pharmacy* ? ? ?Lab Work: ?None ? ?If you have labs (blood work) drawn today and your tests are completely normal, you will receive your results only by: ?MyChart Message (if you have MyChart) OR ?A paper copy in the mail ?If you have any lab test that is abnormal or we need to change your treatment, we will call you to review the results. ? ? ?Testing/Procedures: ?None ? ? ?Follow-Up: ?At [facility], you and your health needs are our priority.  As part of our continuing mission to provide you with exceptional heart care, we have created designated Provider Care Teams.  These Care Teams include your primary Cardiologist (physician) and Advanced Practice Providers (APPs -  Physician Assistants and Nurse Practitioners) who all work together to provide you with the care you need, when you need it. ? ? ?Your next appointment:   ?3 month(s) with Dr. Lalla Brothers ?6 month(s) with our office. ? ?The format for your next appointment:   ?In Person ? ?Provider:   ?You may see Lorine Bears, MD or one of the following Advanced Practice Providers on your designated Care Team:   ?Nicolasa Ducking, NP ?Eula Listen, PA-C ?Cadence Fransico Michael, PA-C{ ? ?Important Information About Sugar ? ? ? ? ?  ?

## 2021-10-08 NOTE — Progress Notes (Signed)
? ?Cardiology Office Note   ? ?Date:  10/08/2021  ? ?ID:  Travis Sams., DOB 29-Aug-1929, MRN [id number] ? ?PCP:  Idelle Crouch, MD  ?Cardiologist:  Kathlyn Sacramento, MD  ?Electrophysiologist:  Vickie Epley, MD  ? ?Chief Complaint: Follow-up ? ?History of Present Illness:  ? ?Travis Midgley. is a 88 y.o. male with history of PAF, complete heart block status post permanent pacemaker, PVCs, HTN, mild aortic stenosis, CKD stage III, CVA in 07/2016, and prior tobacco use who presents for follow-up. ? ?He underwent dual-chamber pacemaker implantation in 07/2018 after presenting with complete heart block.  He was found to have A-fib on device interrogation and has since been on apixaban.  Most recent echo from 10/2020 demonstrated an EF of 55 to 60%, no regional wall motion abnormalities, mild LVH, grade 1 diastolic dysfunction, normal RV systolic function with mildly enlarged ventricular cavity size, mild mitral regurgitation, trivial aortic insufficiency, mild to moderate aortic valve sclerosis without evidence of stenosis, and an estimated right atrial pressure of 3 mmHg.  He was last seen in the office in 12/2020 and was without symptoms of angina or decompensation.  Most recent device interrogation from 08/2021 was stable with a less than 1% A-fib burden. ? ?He comes in doing reasonably well from a cardiac perspective and is without symptoms of angina or decompensation.  Indicates he feels about the same as he did when he was last seen.  He notes stable mild lower extremity swelling.  No falls or symptoms concerning for bleeding.  No orthopnea, dizziness, presyncope, or syncope. ? ? ?Labs independently reviewed: ?07/2021 - Hgb 13.1, PLT 134, TC 129, TG 48, HDL 67, LDL 52, potassium 4.7, BUN 28, serum creatinine 1.7, albumin 4.3, AST/ALT normal, TSH normal ?03/2021 - magnesium 1.9 ? ?Past Medical History:  ?Diagnosis Date  ? Allergic rhinitis   ? Anemia, unspecified   ? Carotid arterial disease ([place])   ? mild  ?  CKD (chronic kidney disease), stage III ([place])   ? Colonic polyp   ? Complete heart block ([place])   ? a. 07/2018 s/p SJM Assurity MRI model [id number] (ser # [id number]).  ? Diastolic dysfunction   ? a. 10/2016 Echo: EF 55-60%, no rwma, Gr1 DD. Very mild AS. Triv AI.  ? Dizziness and giddiness   ? Family history of adverse reaction to anesthesia   ? Heel spur   ? Hyperlipidemia   ? Hypertension   ? Hypothyroidism   ? OA (osteoarthritis)   ? Presence of permanent cardiac pacemaker 08/09/2018  ? PVC's (premature ventricular contractions)   ? Stroke [facility]) 07/2016  ? mini   ? Tinea cruris   ? ? ?Past Surgical History:  ?Procedure Laterality Date  ? APPENDECTOMY    ? BACK SURGERY    ? CIRCUMCISION    ? GANGLION CYST EXCISION    ? MOHS SURGERY    ? PACEMAKER IMPLANT N/A 08/09/2018  ? Procedure: PACEMAKER IMPLANT;  Surgeon: Constance Haw, MD;  Location: [place] CV [facility];  Service: Cardiovascular;  Laterality: N/A;  ? PACEMAKER INSERTION  08/09/2018  ? TEMPORARY PACEMAKER N/A 08/08/2018  ? Procedure: TEMPORARY PACEMAKER;  Surgeon: Nelva Bush, MD;  Location: [place] [facility];  Service: Cardiovascular;  Laterality: N/A;  ? ? ?Current Medications: ?Current Meds  ?Medication Sig  ? acetaminophen (TYLENOL) 500 MG tablet Take 500 mg by mouth daily as needed.  ? apixaban (ELIQUIS) 2.5 MG TABS tablet TAKE 1 TABLET BY MOUTH  TWICE DAILY  ? benazepril-hydrochlorthiazide (LOTENSIN HCT) 20-12.5 MG tablet TAKE 1 TABLET BY MOUTH ONCE DAILY  ? celecoxib (CELEBREX) 200 MG capsule Take 200 mg by mouth daily.  ? diltiazem (CARDIZEM CD) 120 MG 24 hr capsule TAKE 1 CAPSULE BY MOUTH ONCE DAILY  ? doxazosin (CARDURA) 4 MG tablet Take 4 mg by mouth daily.  ? finasteride (PROSCAR) 5 MG tablet Take 5 mg by mouth daily.  ? hydrALAZINE (APRESOLINE) 10 MG tablet Take 10 mg by mouth 2 (two) times daily. Take as needed for blood pressure greater than 170/90  ? hydrocortisone 2.5 % cream Apply 1 application topically 2 (two) times daily.  ?  levothyroxine (SYNTHROID) 100 MCG tablet Take 100 mcg by mouth daily.  ? meclizine (ANTIVERT) 25 MG tablet Take 25 mg by mouth 3 (three) times daily as needed.  ? Multiple Vitamins-Minerals (MULTIVITAMIN ADULTS 50+ PO) Take by mouth.  ? omeprazole (PRILOSEC) 10 MG capsule Take 20 mg by mouth daily.  ? polyethylene glycol (MIRALAX / GLYCOLAX) packet Take 17 g by mouth daily.  ? potassium chloride SA (K-DUR,KLOR-CON) 20 MEQ tablet Take 20 mEq by mouth 2 (two) times daily.  ? rosuvastatin (CRESTOR) 10 MG tablet Take 10 mg by mouth at bedtime.  ? ? ?Allergies:   Morphine and related, Phenol, and Tetracyclines & related  ? ?Social History  ? ?Socioeconomic History  ? Marital status: Married  ?  Spouse name: Not on file  ? Number of children: Not on file  ? Years of education: Not on file  ? Highest education level: Not on file  ?Occupational History  ? Not on file  ?Tobacco Use  ? Smoking status: Former  ?  Years: 4.00  ?  Types: Cigarettes  ? Smokeless tobacco: Never  ?Vaping Use  ? Vaping Use: Never used  ?Substance and Sexual Activity  ? Alcohol use: Yes  ?  Alcohol/week: 2.0 standard drinks  ?  Types: 2 Standard drinks or equivalent per week  ?  Comment: weekly  ? Drug use: No  ? Sexual activity: Not on file  ?Other Topics Concern  ? Not on file  ?Social History Narrative  ? Not on file  ? ?Social Determinants of Health  ? ?Financial Resource Strain: Not on file  ?Food Insecurity: Not on file  ?Transportation Needs: Not on file  ?Physical Activity: Not on file  ?Stress: Not on file  ?Social Connections: Not on file  ?  ? ?Family History:  ?The patient's family history includes Dementia in his mother; Hypertension in his mother; Lung cancer in his sister; Myasthenia gravis in his father. ? ?ROS:   ?12 point review of system is negative as otherwise noted in HPI. ? ? ?EKGs/Labs/Other Studies Reviewed:   ? ?Studies reviewed were summarized above. The additional studies were reviewed today: ? ?2D echo 11/04/2020: ?1. Left  ventricular ejection fraction, by estimation, is 55 to 60%. The  ?left ventricle has normal function. The left ventricle has no regional  ?wall motion abnormalities. There is mild left ventricular hypertrophy.  ?Left ventricular diastolic parameters  ?are consistent with Grade I diastolic dysfunction (impaired relaxation).  ? 2. Right ventricular systolic function is normal. The right ventricular  ?size is mildly enlarged.  ? 3. The mitral valve is normal in structure. Mild mitral valve  ?regurgitation.  ? 4. The aortic valve is tricuspid. Aortic valve regurgitation is trivial.  ?Mild to moderate aortic valve sclerosis/calcification is present, without  ?any evidence of aortic stenosis.  ?  5. The inferior vena cava is normal in size with greater than 50%  ?respiratory variability, suggesting right atrial pressure of 3 mmHg. ?__________ ? ?2D echo 02/21/2020: ?1. Left ventricular ejection fraction, by estimation, is 55 to 60%. The  ?left ventricle has normal function. The left ventricle has no regional  ?wall motion abnormalities. Left ventricular diastolic parameters are  ?consistent with Grade I diastolic  ?dysfunction (impaired relaxation).  ? 2. Right ventricular systolic function is normal. The right ventricular  ?size is normal. There is normal pulmonary artery systolic pressure.  ? 3. Left atrial size was mildly dilated.  ? 4. The mitral valve is normal in structure. Trivial mitral valve  ?regurgitation.  ? 5. The aortic valve is tricuspid. Aortic valve regurgitation is not  ?visualized. Mild aortic valve sclerosis is present, with no evidence of  ?aortic valve stenosis.  ? 6. The inferior vena cava is normal in size with greater than 50%  ?respiratory variability, suggesting right atrial pressure of 3 mmHg.  ? ?Comparison(s): EF 55-60%. ?__________ ? ?2D echo 10/26/2016: ?- Left ventricle: The cavity size was normal. There was moderate  ?  concentric hypertrophy. Systolic function was normal. The  ?  estimated  ejection fraction was in the range of 55% to 60%. Wall  ?  motion was normal; there were no regional wall motion  ?  abnormalities. Doppler parameters are consistent with abnormal  ?  left ventricular relaxat

## 2021-10-25 ENCOUNTER — Other Ambulatory Visit: Payer: Self-pay | Admitting: Cardiovascular Disease

## 2021-10-26 NOTE — Telephone Encounter (Signed)
Refill Request.  

## 2021-10-26 NOTE — Telephone Encounter (Signed)
Prescription refill request for Eliquis received. ?Indication:Afib ?Last office visit:4/23 ?Scr:1.7 ?Age: 88 ?Weight:97.1 kg ? ?Prescription refilled ? ?

## 2021-11-16 ENCOUNTER — Other Ambulatory Visit: Payer: Self-pay | Admitting: Cardiovascular Disease

## 2021-12-09 ENCOUNTER — Ambulatory Visit (INDEPENDENT_AMBULATORY_CARE_PROVIDER_SITE_OTHER): Payer: Medicare Other

## 2021-12-09 DIAGNOSIS — I442 Atrioventricular block, complete: Secondary | ICD-10-CM | POA: Diagnosis not present

## 2021-12-11 LAB — CUP PACEART REMOTE DEVICE CHECK
Battery Remaining Longevity: 82 mo
Battery Remaining Percentage: 70 %
Battery Voltage: 3.01 V
Brady Statistic AP VP Percent: 37 %
Brady Statistic AP VS Percent: 1 %
Brady Statistic AS VP Percent: 62 %
Brady Statistic AS VS Percent: 1 %
Brady Statistic RA Percent Paced: 36 %
Brady Statistic RV Percent Paced: 99 %
Date Time Interrogation Session: 20230614020013
Implantable Lead Implant Date: 20200212
Implantable Lead Implant Date: 20200212
Implantable Lead Location: 753859
Implantable Lead Location: 753860
Implantable Pulse Generator Implant Date: 20200212
Lead Channel Impedance Value: 380 Ohm
Lead Channel Impedance Value: 410 Ohm
Lead Channel Pacing Threshold Amplitude: 0.625 V
Lead Channel Pacing Threshold Amplitude: 0.75 V
Lead Channel Pacing Threshold Pulse Width: 0.5 ms
Lead Channel Pacing Threshold Pulse Width: 0.5 ms
Lead Channel Sensing Intrinsic Amplitude: 12 mV
Lead Channel Sensing Intrinsic Amplitude: 2.9 mV
Lead Channel Setting Pacing Amplitude: 1 V
Lead Channel Setting Pacing Amplitude: 1.625
Lead Channel Setting Pacing Pulse Width: 0.5 ms
Lead Channel Setting Sensing Sensitivity: 6 mV
Pulse Gen Model: 2272
Pulse Gen Serial Number: 9107114

## 2022-01-20 ENCOUNTER — Encounter: Payer: Self-pay | Admitting: Cardiology

## 2022-01-20 ENCOUNTER — Ambulatory Visit (INDEPENDENT_AMBULATORY_CARE_PROVIDER_SITE_OTHER): Payer: Medicare Other | Admitting: Cardiology

## 2022-01-20 VITALS — BP 130/64 | HR 80 | Ht 70.0 in | Wt 212.0 lb

## 2022-01-20 DIAGNOSIS — I442 Atrioventricular block, complete: Secondary | ICD-10-CM

## 2022-01-20 DIAGNOSIS — Z950 Presence of cardiac pacemaker: Secondary | ICD-10-CM

## 2022-01-20 DIAGNOSIS — I480 Paroxysmal atrial fibrillation: Secondary | ICD-10-CM

## 2022-01-20 NOTE — Progress Notes (Signed)
Electrophysiology Office Follow up Visit Note:    Date:  01/20/2022   ID:  Travis Ellis., DOB 1930/02/15, MRN [id number]  PCP:  Marguarite Arbour, MD  [facility] Cardiologist:  Lorine Bears, MD  [facility] Electrophysiologist:  Travis Prude, MD    Interval History:    Travis Dommer. is a 88 y.o. male who presents for a follow up visit. They were last seen in clinic by Travis [name] on October 08, 2021.  The patient has a diagnosis of complete heart block post permanent pacemaker, paroxysmal atrial fibrillation, hypertension and history of stroke.  At the appointment with Travis [name] the patient was doing overall well with a low burden of atrial fibrillation.  I last saw the patient October 15, 2020.  At that appointment he was pacing 100% in the ventricle with stable lead parameters on the device.  He presents today for routine follow-up.     Past Medical History:  Diagnosis Date   Allergic rhinitis    Anemia, unspecified    Carotid arterial disease (HCC)    mild   CKD (chronic kidney disease), stage III (HCC)    Colonic polyp    Complete heart block (HCC)    a. 07/2018 s/p SJM Assurity MRI model [id number] (ser # [id number]).   Diastolic dysfunction    a. 10/2016 Echo: EF 55-60%, no rwma, Gr1 DD. Very mild AS. Triv AI.   Dizziness and giddiness    Family history of adverse reaction to anesthesia    Heel spur    Hyperlipidemia    Hypertension    Hypothyroidism    OA (osteoarthritis)    Presence of permanent cardiac pacemaker 08/09/2018   PVC's (premature ventricular contractions)    Stroke (HCC) 07/2016   mini    Tinea cruris     Past Surgical History:  Procedure Laterality Date   APPENDECTOMY     BACK SURGERY     CIRCUMCISION     GANGLION CYST EXCISION     MOHS SURGERY     PACEMAKER IMPLANT N/A 08/09/2018   Procedure: PACEMAKER IMPLANT;  Surgeon: Regan Lemming, MD;  [facility];  Service: Cardiovascular;  Laterality: N/A;   PACEMAKER  INSERTION  08/09/2018   TEMPORARY PACEMAKER N/A 08/08/2018   Procedure: TEMPORARY PACEMAKER;  Surgeon: Yvonne Kendall, MD;  Location: [facility] INVASIVE CV LAB;  Service: Cardiovascular;  Laterality: N/A;    Current Medications: Current Meds  Medication Sig   acetaminophen (TYLENOL) 500 MG tablet Take 500 mg by mouth daily as needed.   benazepril-hydrochlorthiazide (LOTENSIN HCT) 20-12.5 MG tablet TAKE 1 TABLET BY MOUTH ONCE DAILY   celecoxib (CELEBREX) 200 MG capsule Take 200 mg by mouth daily.   diltiazem (CARDIZEM CD) 120 MG 24 hr capsule TAKE 1 CAPSULE BY MOUTH ONCE DAILY   doxazosin (CARDURA) 4 MG tablet Take 4 mg by mouth daily.   ELIQUIS 2.5 MG TABS tablet TAKE 1 TABLET BY MOUTH TWICE DAILY   finasteride (PROSCAR) 5 MG tablet Take 5 mg by mouth daily.   hydrALAZINE (APRESOLINE) 10 MG tablet Take 10 mg by mouth 2 (two) times daily. Take as needed for blood pressure greater than 170/90   hydrocortisone 2.5 % cream Apply 1 application topically 2 (two) times daily.   levothyroxine (SYNTHROID) 100 MCG tablet Take 100 mcg by mouth daily.   meclizine (ANTIVERT) 25 MG tablet Take 25 mg by mouth 3 (three) times daily as needed.   Multiple Vitamins-Minerals (  MULTIVITAMIN ADULTS 50+ PO) Take by mouth.   omeprazole (PRILOSEC) 10 MG capsule Take 20 mg by mouth daily.   polyethylene glycol (MIRALAX / GLYCOLAX) packet Take 17 g by mouth daily.   potassium chloride SA (K-DUR,KLOR-CON) 20 MEQ tablet Take 20 mEq by mouth 2 (two) times daily.   rosuvastatin (CRESTOR) 10 MG tablet Take 10 mg by mouth at bedtime.     Allergies:   Morphine and related, Phenol, and Tetracyclines & related   Social History   Socioeconomic History   Marital status: Married    Spouse name: Not on file   Number of children: Not on file   Years of education: Not on file   Highest education level: Not on file  Occupational History   Not on file  Tobacco Use   Smoking status: Former    Years: 4.00    Types: Cigarettes    Smokeless tobacco: Never  Vaping Use   Vaping Use: Never used  Substance and Sexual Activity   Alcohol use: Yes    Alcohol/week: 2.0 standard drinks of alcohol    Types: 2 Standard drinks or equivalent per week    Comment: weekly   Drug use: No   Sexual activity: Not on file  Other Topics Concern   Not on file  Social History Narrative   Not on file   Social Determinants of Health   Financial Resource Strain: Not on file  Food Insecurity: Not on file  Transportation Needs: Not on file  Physical Activity: Not on file  Stress: Not on file  Social Connections: Not on file     Family History: The patient's family history includes Dementia in his mother; Hypertension in his mother; Lung cancer in his sister; Myasthenia gravis in his father.  ROS:   Please see the history of present illness.    All other systems reviewed and are negative.  EKGs/Labs/Other Studies Reviewed:    The following studies were reviewed today:  January 20, 2022 in clinic device interrogation personally reviewed Battery longevity 6.6 years Lead parameters stable Ventricular pacing 99% Atrial pacing 36% Less than 1% AF burden    Recent Labs: No results found for requested labs within last 365 days.  Recent Lipid Panel No results found for: "CHOL", "TRIG", "HDL", "CHOLHDL", "VLDL", "LDLCALC", "LDLDIRECT"  Physical Exam:    VS:  BP 130/64 (BP Location: Left Arm, Patient Position: Sitting, Cuff Size: Normal)   Pulse 80   Ht 5\' 10"  (1.778 m)   Wt 212 lb (96.2 kg)   BMI 30.42 kg/m     Wt Readings from Last 3 Encounters:  01/20/22 212 lb (96.2 kg)  10/08/21 214 lb (97.1 kg)  01/16/21 203 lb 8 oz (92.3 kg)     GEN:  Well nourished, well developed in no acute distress HEENT: Normal NECK: No JVD; No carotid bruits LYMPHATICS: No lymphadenopathy CARDIAC: RRR, no murmurs, rubs, gallops.  Pacemaker pocket well-healed RESPIRATORY:  Clear to auscultation without rales, wheezing or rhonchi   ABDOMEN: Soft, non-tender, non-distended MUSCULOSKELETAL:  No edema; No deformity  SKIN: Warm and dry NEUROLOGIC:  Alert and oriented x 3 PSYCHIATRIC:  Normal affect        ASSESSMENT:    1. Complete heart block ([place])   2. Presence of permanent cardiac pacemaker   3. Paroxysmal atrial fibrillation (HCC)    PLAN:    In order of problems listed above:   #Complete heart block #Pacemaker in situ Device functioning appropriately.  Continue remote  monitoring. Follow-up 1 year or sooner as needed.  #Paroxysmal atrial fibrillation Very low burden on the pacemaker Continue Eliquis  Follow-up 1 year or sooner as needed.  APP appointment okay.   Medication Adjustments/Labs and Tests Ordered: Current medicines are reviewed at length with the patient today.  Concerns regarding medicines are outlined above.  No orders of the defined types were placed in this encounter.  No orders of the defined types were placed in this encounter.    Signed, Steffanie Dunn, MD, [facility], [facility] 01/20/2022 2:01 PM    Electrophysiology [facility]

## 2022-01-20 NOTE — Patient Instructions (Signed)
Medications: Your physician recommends that you continue on your current medications as directed. Please refer to the Current Medication list given to you today. *If you need a refill on your cardiac medications before your next appointment, please call your pharmacy*  Lab Work: None. If you have labs (blood work) drawn today and your tests are completely normal, you will receive your results only by: MyChart Message (if you have MyChart) OR A paper copy in the mail If you have any lab test that is abnormal or we need to change your treatment, we will call you to review the results.  Testing/Procedures: None.  Follow-Up: At [facility], you and your health needs are our priority.  As part of our continuing mission to provide you with exceptional heart care, we have created designated Provider Care Teams.  These Care Teams include your primary Cardiologist (physician) and Advanced Practice Providers (APPs -  Physician Assistants and Nurse Practitioners) who all work together to provide you with the care you need, when you need it.  Your physician wants you to follow-up in: 12 months with Cameron Lambert, MD     You will receive a reminder letter in the mail two months in advance. If you don't receive a letter, please call our office to schedule the follow-up appointment.  We recommend signing up for the patient portal called "MyChart".  Sign up information is provided on this After Visit Summary.  MyChart is used to connect with patients for Virtual Visits (Telemedicine).  Patients are able to view lab/test results, encounter notes, upcoming appointments, etc.  Non-urgent messages can be sent to your provider as well.   To learn more about what you can do with MyChart, go to [email or web address].    Any Other Special Instructions Will Be Listed Below (If Applicable).  

## 2022-03-10 ENCOUNTER — Ambulatory Visit (INDEPENDENT_AMBULATORY_CARE_PROVIDER_SITE_OTHER): Payer: Medicare Other

## 2022-03-10 DIAGNOSIS — I442 Atrioventricular block, complete: Secondary | ICD-10-CM | POA: Diagnosis not present

## 2022-03-11 LAB — CUP PACEART REMOTE DEVICE CHECK
Battery Remaining Longevity: 77 mo
Battery Remaining Percentage: 67 %
Battery Voltage: 3.01 V
Brady Statistic AP VP Percent: 41 %
Brady Statistic AP VS Percent: 1 %
Brady Statistic AS VP Percent: 59 %
Brady Statistic AS VS Percent: 1 %
Brady Statistic RA Percent Paced: 41 %
Brady Statistic RV Percent Paced: 99 %
Date Time Interrogation Session: 20230913020015
Implantable Lead Implant Date: 20200212
Implantable Lead Implant Date: 20200212
Implantable Lead Location: 753859
Implantable Lead Location: 753860
Implantable Pulse Generator Implant Date: 20200212
Lead Channel Impedance Value: 360 Ohm
Lead Channel Impedance Value: 390 Ohm
Lead Channel Pacing Threshold Amplitude: 0.625 V
Lead Channel Pacing Threshold Amplitude: 1 V
Lead Channel Pacing Threshold Pulse Width: 0.5 ms
Lead Channel Pacing Threshold Pulse Width: 0.5 ms
Lead Channel Sensing Intrinsic Amplitude: 12 mV
Lead Channel Sensing Intrinsic Amplitude: 4.4 mV
Lead Channel Setting Pacing Amplitude: 1.25 V
Lead Channel Setting Pacing Amplitude: 1.625
Lead Channel Setting Pacing Pulse Width: 0.5 ms
Lead Channel Setting Sensing Sensitivity: 6 mV
Pulse Gen Model: 2272
Pulse Gen Serial Number: 9107114

## 2022-03-25 NOTE — Progress Notes (Signed)
Remote pacemaker transmission.   

## 2022-04-02 IMAGING — CT CT HEAD W/O CM
3 of 4 series · 15 of 47 positions shown, 18 images · non-contrast
Comparison: CT head 08/20/2016, MRI 09/09/2016

CLINICAL DATA: Transient episode of aphasia, TIA, hypertension, on
Eliquis

EXAM:
CT HEAD WITHOUT CONTRAST
TECHNIQUE: Contiguous axial images were obtained from the base of the skull
through the vertex without intravenous contrast.

[Series 2: head wo · axial · 0.43mm/px · z∈[-88,+37]mm · 9 of 31 slices shown, 12 images]
[im 3/31  brain]
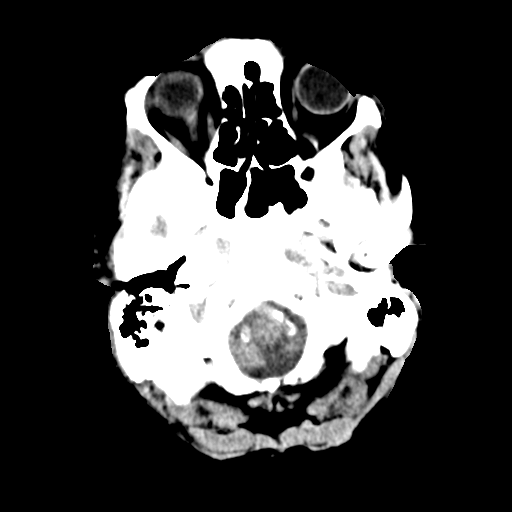
[im 3/31  bone]
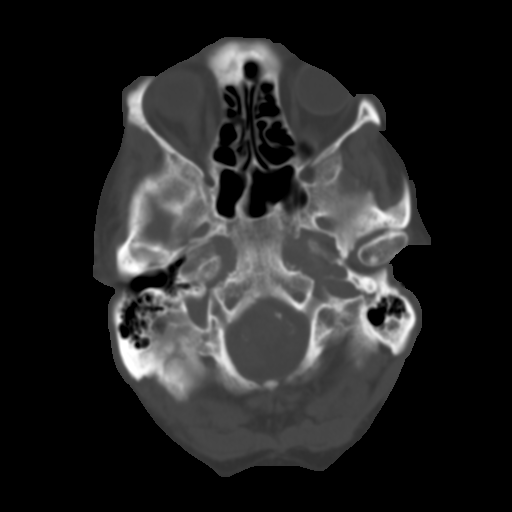
[im 7/31  brain]
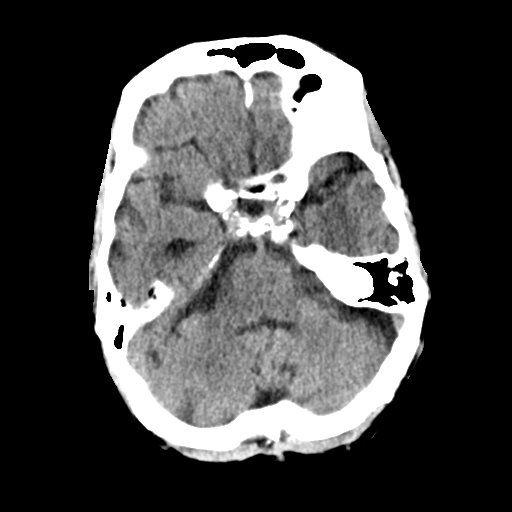
[im 9/31  brain]
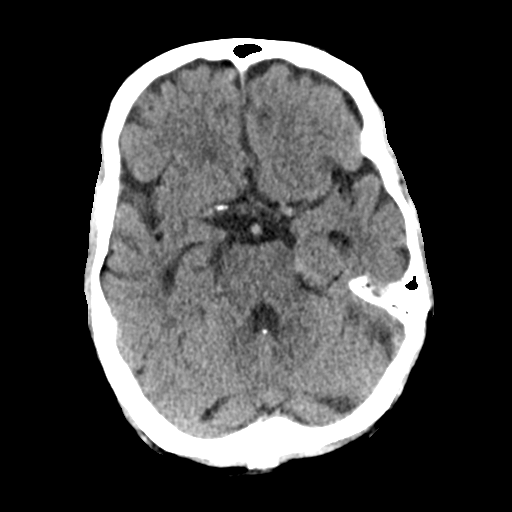
[im 13/31  brain]
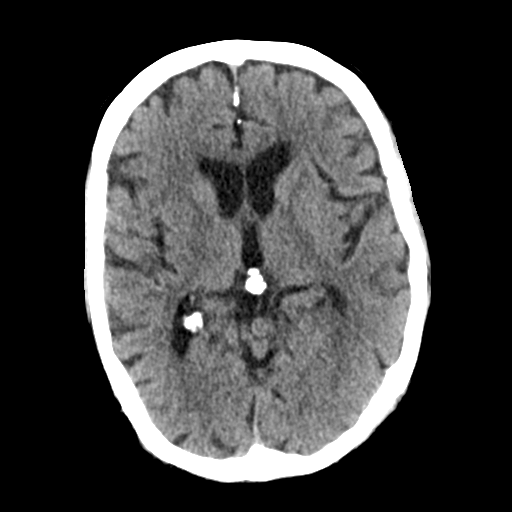
[im 16/31  brain]
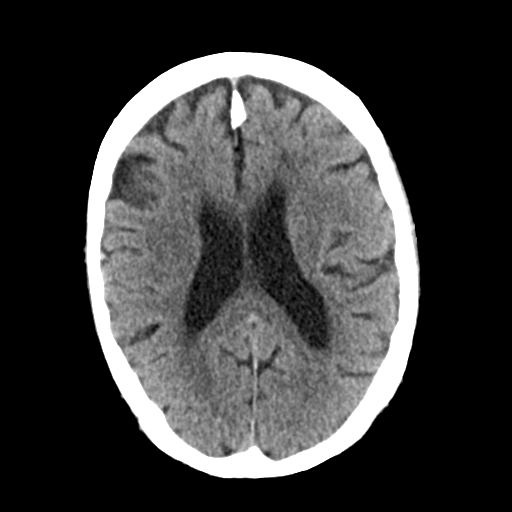
[im 16/31  bone]
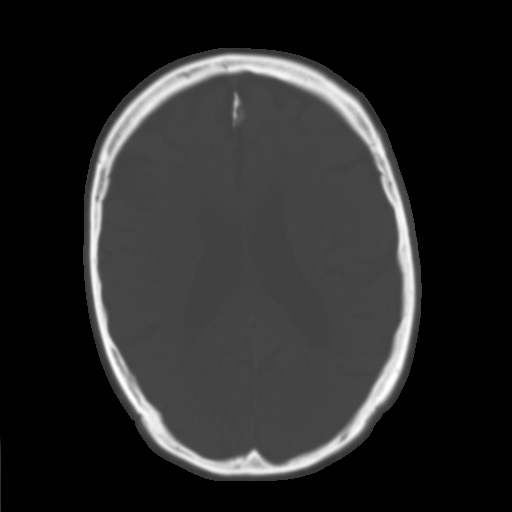
[im 18/31  brain]
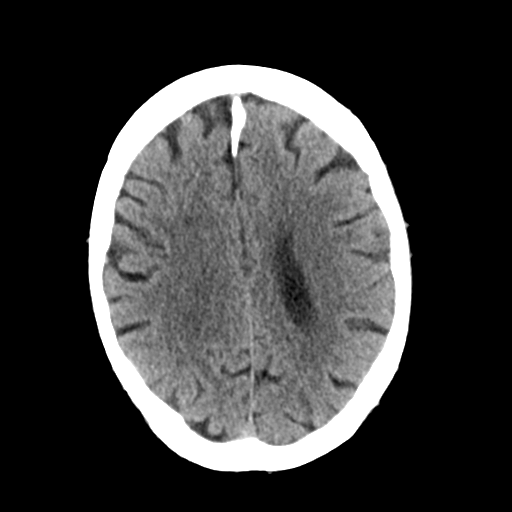
[im 22/31  brain]
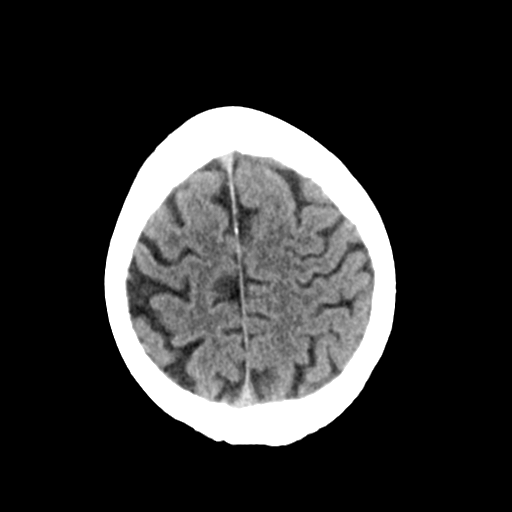
[im 24/31  brain]
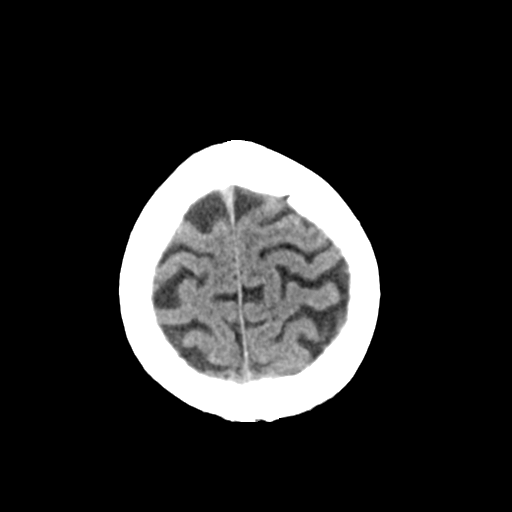
[im 28/31  brain]
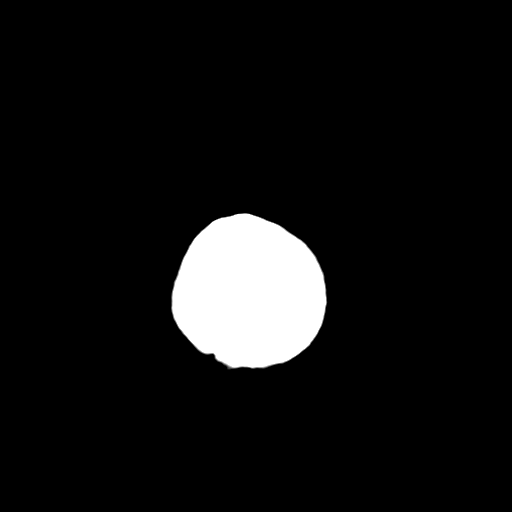
[im 28/31  bone]
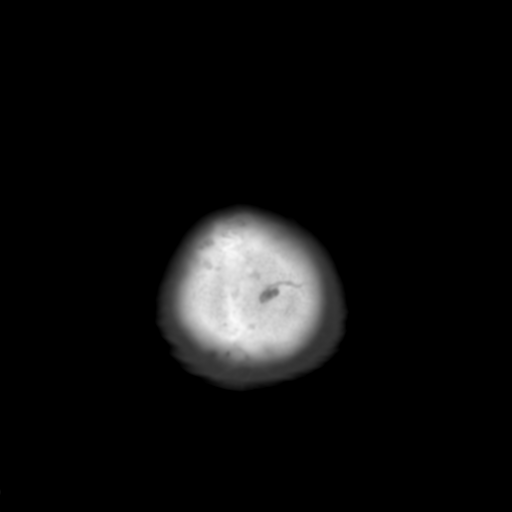

[Series 4: coronal soft tissue · coronal · 0.33mm/px · 3 of 64 slices shown]
[im 22/64  brain]
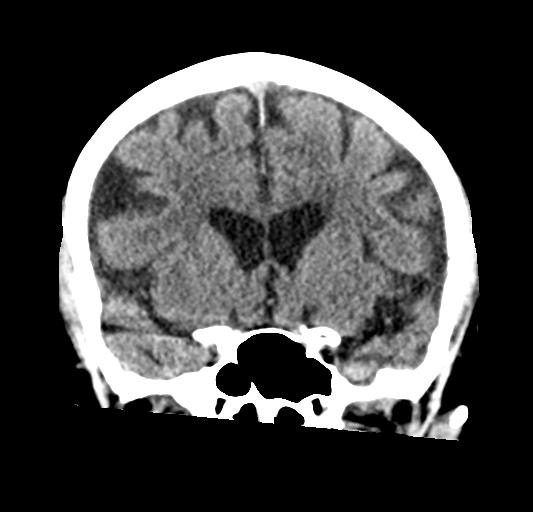
[im 29/64  brain]
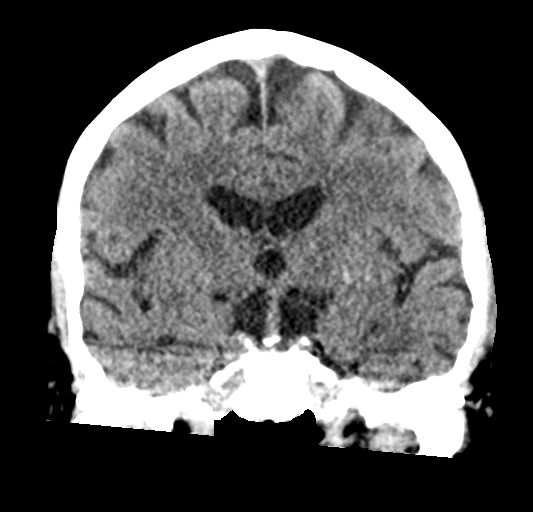
[im 36/64  brain]
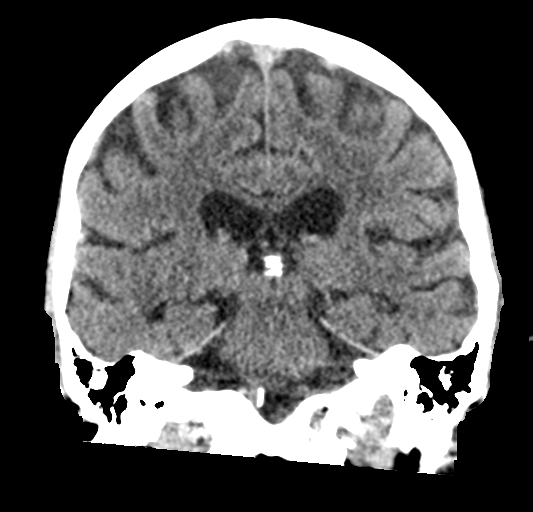

[Series 5: sagittal soft tissue · sagittal · 0.33mm/px · 3 of 50 slices shown]
[im 17/50  brain]
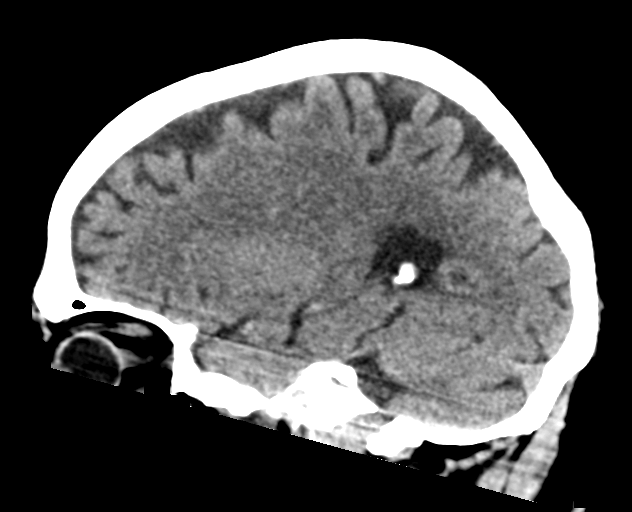
[im 25/50  brain]
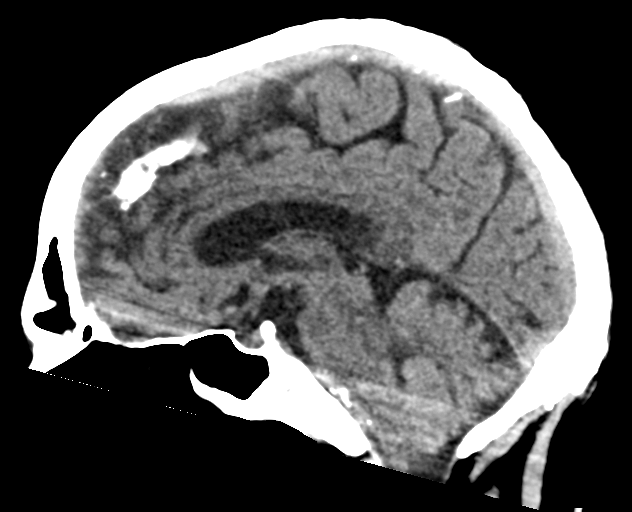
[im 33/50  brain]
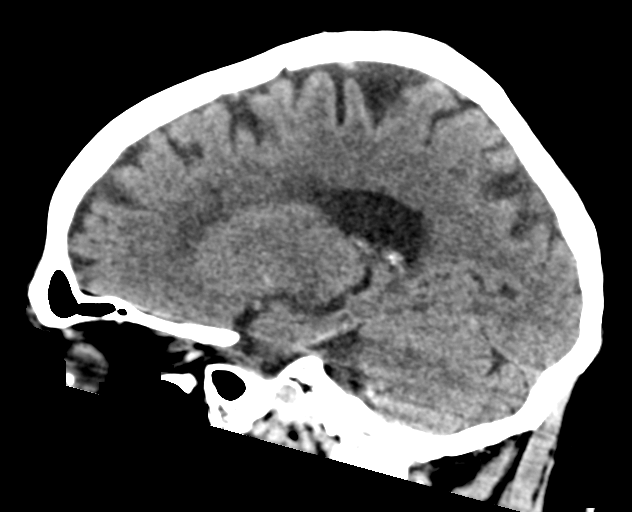

[15 of 47 positions shown; findings below may reference images not displayed]

FINDINGS: Brain: No evidence of acute infarction, hemorrhage, hydrocephalus,
extra-axial collection or mass lesion/mass effect. Symmetric
prominence of the ventricles, cisterns and sulci compatible with
parenchymal volume loss. Patchy areas of white matter
hypoattenuation are most compatible with chronic microvascular
angiopathy. Senescent mineralization of the basal ganglia. Stable
benign dural calcifications are similar to priors.

Vascular: Atherosclerotic calcification of the carotid siphons and
intradural vertebral arteries. No hyperdense vessel.

Skull: No calvarial fracture or suspicious osseous lesion. No scalp
swelling or hematoma.

Sinuses/Orbits: Paranasal sinuses and mastoid air cells are
predominantly clear. Bilateral senescent scleral plaques. Included
orbits are otherwise unremarkable.

Other: None.
IMPRESSION: 1. No acute intracranial abnormality. If there is persisting
clinical concern for infarct, MRI is more sensitive and specific for
early changes of ischemia.
2. Stable parenchymal volume loss and chronic microvascular
angiopathy.

## 2022-04-20 ENCOUNTER — Other Ambulatory Visit: Payer: Self-pay | Admitting: Cardiovascular Disease

## 2022-04-20 NOTE — Telephone Encounter (Signed)
Prescription refill request for Eliquis received. Indication: PAF Last office visit: 01/20/22  Travis Mage MD Scr: 1.9 on 02/22/22  Age: 88 Weight: 96.2kg  Based on above findings Eliquis 2.5mg  twice daily is the appropriate dose.  Refill approved.

## 2022-04-20 NOTE — Telephone Encounter (Signed)
Refill request

## 2022-05-07 ENCOUNTER — Ambulatory Visit: Payer: Medicare Other | Admitting: Cardiovascular Disease

## 2022-05-11 ENCOUNTER — Ambulatory Visit: Payer: Medicare Other | Admitting: Cardiovascular Disease

## 2022-05-18 ENCOUNTER — Ambulatory Visit: Payer: Medicare Other | Attending: Cardiology | Admitting: Cardiology

## 2022-05-18 ENCOUNTER — Encounter: Payer: Self-pay | Admitting: Cardiology

## 2022-05-18 VITALS — BP 140/62 | HR 69 | Ht 70.0 in | Wt 208.8 lb

## 2022-05-18 DIAGNOSIS — I480 Paroxysmal atrial fibrillation: Secondary | ICD-10-CM

## 2022-05-18 DIAGNOSIS — Z950 Presence of cardiac pacemaker: Secondary | ICD-10-CM | POA: Diagnosis not present

## 2022-05-18 DIAGNOSIS — I442 Atrioventricular block, complete: Secondary | ICD-10-CM | POA: Diagnosis not present

## 2022-05-18 DIAGNOSIS — I10 Essential (primary) hypertension: Secondary | ICD-10-CM | POA: Diagnosis not present

## 2022-05-18 DIAGNOSIS — I359 Nonrheumatic aortic valve disorder, unspecified: Secondary | ICD-10-CM

## 2022-05-18 NOTE — Progress Notes (Signed)
Cardiology Clinic Note   Patient Name: Travis EllisGlenn D Escalante [name] Date of Encounter: 05/18/2022  Primary Care Provider:  Marguarite ArbourSparks, Jeffrey D, MD Primary Cardiologist:  Lorine BearsMuhammad Arida, MD  Patient Profile    88 year old male with a history of paroxysmal atrial fibrillation, complete heart block status post pacemaker placement, PVCs, hypertension, mild aortic stenosis, CKD stage III, CVA in 07/2016, and prior tobacco use who presents today for follow-up.  Past Medical History    Past Medical History:  Diagnosis Date   Allergic rhinitis    Anemia, unspecified    Carotid arterial disease (HCC)    mild   CKD (chronic kidney disease), stage III (HCC)    Colonic polyp    Complete heart block (HCC)    a. 07/2018 s/p SJM Assurity MRI model [id number] (ser # [id number]).   Diastolic dysfunction    a. 10/2016 Echo: EF 55-60%, no rwma, Gr1 DD. Very mild AS. Triv AI.   Dizziness and giddiness    Family history of adverse reaction to anesthesia    Heel spur    Hyperlipidemia    Hypertension    Hypothyroidism    OA (osteoarthritis)    Presence of permanent cardiac pacemaker 08/09/2018   PVC's (premature ventricular contractions)    Stroke (HCC) 07/2016   mini    Tinea cruris    Past Surgical History:  Procedure Laterality Date   APPENDECTOMY     BACK SURGERY     CIRCUMCISION     GANGLION CYST EXCISION     MOHS SURGERY     PACEMAKER IMPLANT N/A 08/09/2018   Procedure: PACEMAKER IMPLANT;  Surgeon: Regan Lemmingamnitz, Will Martin, MD;  Location: [facility];  Service: Cardiovascular;  Laterality: N/A;   PACEMAKER INSERTION  08/09/2018   TEMPORARY PACEMAKER N/A 08/08/2018   Procedure: TEMPORARY PACEMAKER;  Surgeon: Yvonne KendallEnd, Christopher, MD;  Location: [facility] INVASIVE CV LAB;  Service: Cardiovascular;  Laterality: N/A;    Allergies  Allergies  Allergen Reactions   Morphine And Related    Phenol     Other reaction(s): Unknown   Tetracyclines & Related     Other reaction(s): UNKNOWN    History of  Present Illness    Travis HohGlen D. Roque CashDodson, [name] is a 88 year old male with a previously mentioned past medical history of paroxysmal atrial fibrillation, complete heart block status post permanent pacemaker placement, PVCs, hypertension, mild aortic stenosis, CKD stage III, CVA in 07/2016, and prior tobacco use.  He underwent dual-chamber pacemaker implementation in 07/2018 after presenting with complete heart block.  He was found to have atrial fibrillation on device interrogation and has since been on apixaban.  Most recent echocardiogram from 10/2020 demonstrated an EF of 55 to 60%, no regional wall motion abnormalities, G1 DD, mild mitral regurgitation, trivial aortic insufficiency, and mild to moderate aortic valve sclerosis without evidence of stenosis.  He was last seen in clinic on 01/20/2022 by Dr. Lalla BrothersLambert where he was paced at 100% in the ventricle with stable lead parameters on his device.  He also was doing well overall with low burden of atrial fibrillation.  Is to continue with remote monitoring continue on apixaban recommend follow-up with EP in 1 year.  He returns to clinic today stating that overall he has been doing fairly well.  He has just recently followed up with dermatology as he has a spot to the back of his right ankle which was biopsied that potentially could be psoriasis as he has several other spots on his  bilateral lower extremities.  He also continues to have some peripheral edema to his bilateral lower extremities.  When asked to see elevated his legs during the day he states that he forgets to do that part but will attempt to try.  He also endorses fatigue.  Denies any chest pain, shortness of breath, lightheadedness, dizziness or syncope/near syncope.  He also denies any hospitalizations or recent emergency department visits.  Home Medications    Current Outpatient Medications  Medication Sig Dispense Refill   acetaminophen (TYLENOL) 500 MG tablet Take 500 mg by mouth daily as  needed.     benazepril-hydrochlorthiazide (LOTENSIN HCT) 20-12.5 MG tablet TAKE 1 TABLET BY MOUTH ONCE DAILY 90 tablet 2   celecoxib (CELEBREX) 200 MG capsule Take 200 mg by mouth daily.     desvenlafaxine (PRISTIQ) 25 MG 24 hr tablet Take 1 tablet by mouth daily.     diltiazem (CARDIZEM CD) 120 MG 24 hr capsule TAKE 1 CAPSULE BY MOUTH ONCE DAILY 90 capsule 2   doxazosin (CARDURA) 4 MG tablet Take 4 mg by mouth daily.     ELIQUIS 2.5 MG TABS tablet TAKE 1 TABLET BY MOUTH TWICE DAILY 180 tablet 1   finasteride (PROSCAR) 5 MG tablet Take 5 mg by mouth daily.     hydrALAZINE (APRESOLINE) 10 MG tablet Take 10 mg by mouth 2 (two) times daily. Take as needed for blood pressure greater than 170/90     hydrocortisone 2.5 % cream Apply 1 application topically 2 (two) times daily.     levothyroxine (SYNTHROID) 100 MCG tablet Take 100 mcg by mouth daily.     meclizine (ANTIVERT) 25 MG tablet Take 25 mg by mouth 3 (three) times daily as needed.     Multiple Vitamins-Minerals (MULTIVITAMIN ADULTS 50+ PO) Take by mouth.     omeprazole (PRILOSEC) 10 MG capsule Take 20 mg by mouth daily.     polyethylene glycol (MIRALAX / GLYCOLAX) packet Take 17 g by mouth daily.     potassium chloride SA (K-DUR,KLOR-CON) 20 MEQ tablet Take 20 mEq by mouth 2 (two) times daily.     rosuvastatin (CRESTOR) 10 MG tablet Take 10 mg by mouth at bedtime.     azelastine (ASTELIN) 0.1 % nasal spray Place into both nostrils. (Patient not taking: Reported on 10/08/2021)     No current facility-administered medications for this visit.     Family History    Family History  Problem Relation Age of Onset   Hypertension Mother    Dementia Mother    Myasthenia gravis Father    Lung cancer Sister    He indicated that his mother is deceased. He indicated that his father is deceased. He indicated that his sister is deceased.  Social History    Social History   Socioeconomic History   Marital status: Married    Spouse name: Not on  file   Number of children: Not on file   Years of education: Not on file   Highest education level: Not on file  Occupational History   Not on file  Tobacco Use   Smoking status: Former    Years: 4.00    Types: Cigarettes   Smokeless tobacco: Never  Vaping Use   Vaping Use: Never used  Substance and Sexual Activity   Alcohol use: Yes    Alcohol/week: 2.0 standard drinks of alcohol    Types: 2 Standard drinks or equivalent per week    Comment: weekly   Drug use: No  Sexual activity: Not on file  Other Topics Concern   Not on file  Social History Narrative   Not on file   Social Determinants of Health   Financial Resource Strain: Not on file  Food Insecurity: Not on file  Transportation Needs: Not on file  Physical Activity: Not on file  Stress: Not on file  Social Connections: Not on file  Intimate Partner Violence: Not on file     Review of Systems    General:  No chills, fever, night sweats or weight changes.  Endorses fatigue Cardiovascular:  No chest pain, dyspnea on exertion, endorses peripheral edema, orthopnea, palpitations, paroxysmal nocturnal dyspnea. Dermatological: No rash, lesions/masses.  Endorses a patch to the back of his right ankle that was recently biopsied when she looks in dry and silvery scaly like psoriasis along with several other small areas to his bilateral lower extremities Respiratory: No cough, dyspnea Urologic: No hematuria, dysuria Abdominal:   No nausea, vomiting, diarrhea, bright red blood per rectum, melena, or hematemesis Neurologic:  No visual changes, wkns, changes in mental status. All other systems reviewed and are otherwise negative except as noted above.   Physical Exam    VS:  BP (!) 140/62 (BP Location: Left Arm, Patient Position: Sitting, Cuff Size: Normal)   Pulse 69   Ht 5\' 10"  (1.778 m)   Wt 208 lb 12.8 oz (94.7 kg)   SpO2 96%   BMI 29.96 kg/m  , BMI Body mass index is 29.96 kg/m.     GEN: Well nourished, well  developed, in no acute distress. HEENT: normal.  Glasses on. Neck: Supple, no JVD, carotid bruits, or masses. Cardiac: RRR, I/VI systolic murmur best heard at the RBS that does not radiate to the bilateral carotids, without rubs, or gallops. No clubbing, cyanosis, trace edema.  Radials/DP/PT 2+ and equal bilaterally.  Respiratory:  Respirations regular and unlabored, clear to auscultation bilaterally. GI: Soft, nontender, nondistended, BS + x 4. MS: no deformity or atrophy. Skin: warm and dry, no rash. Neuro:  Strength and sensation are intact. Psych: Normal affect.  Accessory Clinical Findings    ECG personally reviewed by me today-a sensed and V paced- No acute changes  Lab Results  Component Value Date   WBC 5.4 02/05/2020   HGB 13.3 02/05/2020   HCT 40.1 02/05/2020   MCV 95.7 02/05/2020   PLT 121 (L) 02/05/2020   Lab Results  Component Value Date   CREATININE 1.67 (H) 02/05/2020   BUN 22 02/05/2020   NA 137 02/05/2020   K 4.1 02/05/2020   CL 101 02/05/2020   CO2 24 02/05/2020   Lab Results  Component Value Date   ALT 26 08/09/2018   AST 24 08/09/2018   ALKPHOS 63 08/09/2018   BILITOT 0.5 08/09/2018   No results found for: "CHOL", "HDL", "LDLCALC", "LDLDIRECT", "TRIG", "CHOLHDL"  No results found for: "HGBA1C"  Assessment & Plan   1.  Complete heart block status post dual-chamber PPM.  Device appears to be functioning normally with a sensing and V pacing noted on EKG from today.  Continue with follow-up with EP as directed.  2.  Paroxysmal atrial fibrillation without any symptoms of palpitations today.  Check in 03/10/2022 he had 0% A-fib burden.  He remains on Cardizem 120 mg once daily.  He is also tolerating renally dosed apixaban (age and serum creatinine) without side effects.  3.  Hypertension with blood pressure of 140/62.  Remained stable.  He is continued on benazepril HCTZ  20/12.5 mg daily, Cardizem 120 mg daily, hydralazine 10 mg twice daily as needed for  blood pressures greater than 170/90.  He has been encouraged to continue to monitor his pressures at home.  4.  History of CVA with no new deficits.  He remains on apixaban in lieu of aspirin and is continued on rosuvastatin 10 mg at bedtime.  5.  Aortic valve sclerosis without evidence of stenosis on echocardiogram.  We will continue to monitor.  6.  Disposition patient return to clinic to see Dr. Renato Gails in 6 months or sooner if needed.  Aamira Bischoff, NP 05/18/2022, 11:51 AM

## 2022-05-18 NOTE — Patient Instructions (Addendum)
Medication Instructions:  No changes at this time.   *If you need a refill on your cardiac medications before your next appointment, please call your pharmacy*   Lab Work: None  If you have labs (blood work) drawn today and your tests are completely normal, you will receive your results only by: MyChart Message (if you have MyChart) OR A paper copy in the mail If you have any lab test that is abnormal or we need to change your treatment, we will call you to review the results.   Testing/Procedures: None    Follow-Up: At [facility], you and your health needs are our priority.  As part of our continuing mission to provide you with exceptional heart care, we have created designated Provider Care Teams.  These Care Teams include your primary Cardiologist (physician) and Advanced Practice Providers (APPs -  Physician Assistants and Nurse Practitioners) who all work together to provide you with the care you need, when you need it.   Your next appointment:   6 month(s)  The format for your next appointment:   In Person  Provider:   Muhammad Arida, MD     Important Information About Sugar       

## 2022-05-28 ENCOUNTER — Ambulatory Visit: Payer: Medicare Other | Admitting: Cardiology

## 2022-06-09 ENCOUNTER — Ambulatory Visit (INDEPENDENT_AMBULATORY_CARE_PROVIDER_SITE_OTHER): Payer: Medicare Other

## 2022-06-09 DIAGNOSIS — I480 Paroxysmal atrial fibrillation: Secondary | ICD-10-CM

## 2022-06-09 LAB — CUP PACEART REMOTE DEVICE CHECK
Battery Remaining Longevity: 74 mo
Battery Remaining Percentage: 65 %
Battery Voltage: 2.99 V
Brady Statistic AP VP Percent: 41 %
Brady Statistic AP VS Percent: 1 %
Brady Statistic AS VP Percent: 59 %
Brady Statistic AS VS Percent: 1 %
Brady Statistic RA Percent Paced: 40 %
Brady Statistic RV Percent Paced: 99 %
Date Time Interrogation Session: 20231213020014
Implantable Lead Connection Status: 753985
Implantable Lead Connection Status: 753985
Implantable Lead Implant Date: 20200212
Implantable Lead Implant Date: 20200212
Implantable Lead Location: 753859
Implantable Lead Location: 753860
Implantable Pulse Generator Implant Date: 20200212
Lead Channel Impedance Value: 390 Ohm
Lead Channel Impedance Value: 400 Ohm
Lead Channel Pacing Threshold Amplitude: 0.625 V
Lead Channel Pacing Threshold Amplitude: 0.875 V
Lead Channel Pacing Threshold Pulse Width: 0.5 ms
Lead Channel Pacing Threshold Pulse Width: 0.5 ms
Lead Channel Sensing Intrinsic Amplitude: 12 mV
Lead Channel Sensing Intrinsic Amplitude: 4.5 mV
Lead Channel Setting Pacing Amplitude: 1.125
Lead Channel Setting Pacing Amplitude: 1.625
Lead Channel Setting Pacing Pulse Width: 0.5 ms
Lead Channel Setting Sensing Sensitivity: 6 mV
Pulse Gen Model: 2272
Pulse Gen Serial Number: 9107114

## 2022-07-06 NOTE — Progress Notes (Signed)
Remote pacemaker transmission.   

## 2022-09-08 ENCOUNTER — Ambulatory Visit (INDEPENDENT_AMBULATORY_CARE_PROVIDER_SITE_OTHER): Payer: Medicare Other

## 2022-09-08 DIAGNOSIS — I442 Atrioventricular block, complete: Secondary | ICD-10-CM | POA: Diagnosis not present

## 2022-09-09 LAB — CUP PACEART REMOTE DEVICE CHECK
Battery Remaining Longevity: 71 mo
Battery Remaining Percentage: 62 %
Battery Voltage: 2.99 V
Brady Statistic AP VP Percent: 40 %
Brady Statistic AP VS Percent: 1 %
Brady Statistic AS VP Percent: 59 %
Brady Statistic AS VS Percent: 1 %
Brady Statistic RA Percent Paced: 40 %
Brady Statistic RV Percent Paced: 99 %
Date Time Interrogation Session: 20240313020015
Implantable Lead Connection Status: 753985
Implantable Lead Connection Status: 753985
Implantable Lead Implant Date: 20200212
Implantable Lead Implant Date: 20200212
Implantable Lead Location: 753859
Implantable Lead Location: 753860
Implantable Pulse Generator Implant Date: 20200212
Lead Channel Impedance Value: 350 Ohm
Lead Channel Impedance Value: 390 Ohm
Lead Channel Pacing Threshold Amplitude: 0.625 V
Lead Channel Pacing Threshold Amplitude: 1 V
Lead Channel Pacing Threshold Pulse Width: 0.5 ms
Lead Channel Pacing Threshold Pulse Width: 0.5 ms
Lead Channel Sensing Intrinsic Amplitude: 1.5 mV
Lead Channel Sensing Intrinsic Amplitude: 12 mV
Lead Channel Setting Pacing Amplitude: 1.25 V
Lead Channel Setting Pacing Amplitude: 1.625
Lead Channel Setting Pacing Pulse Width: 0.5 ms
Lead Channel Setting Sensing Sensitivity: 6 mV
Pulse Gen Model: 2272
Pulse Gen Serial Number: 9107114

## 2022-10-13 NOTE — Progress Notes (Signed)
Remote pacemaker transmission.   

## 2022-11-03 ENCOUNTER — Ambulatory Visit
Admission: EM | Admit: 2022-11-03 | Discharge: 2022-11-03 | Disposition: A | Discharge status: Home / Self Care | Payer: Medicare Other | Attending: Emergency Medicine | Admitting: Emergency Medicine

## 2022-11-03 ENCOUNTER — Encounter: Payer: Self-pay | Admitting: Emergency Medicine

## 2022-11-03 DIAGNOSIS — J069 Acute upper respiratory infection, unspecified: Secondary | ICD-10-CM

## 2022-11-03 DIAGNOSIS — R051 Acute cough: Secondary | ICD-10-CM

## 2022-11-03 MED ORDER — BENZONATATE 100 MG PO CAPS: 200.0000 mg | ORAL_CAPSULE | Freq: Three times a day (TID) | ORAL | 0 refills | Status: DC

## 2022-11-03 MED ORDER — AMOXICILLIN-POT CLAVULANATE 875-125 MG PO TABS: 1.0000 | ORAL_TABLET | Freq: Two times a day (BID) | ORAL | 0 refills | Status: AC

## 2022-11-03 MED ORDER — IPRATROPIUM BROMIDE 0.06 % NA SOLN: 2.0000 | Freq: Four times a day (QID) | NASAL | 12 refills | Status: DC

## 2022-11-03 NOTE — ED Triage Notes (Signed)
Pt c/o cough and congestion x 10 days. Pt has been taking OTC cough syrup with no relief.

## 2022-11-03 NOTE — Discharge Instructions (Addendum)
You have been seen today for evaluation of respiratory symptoms and have been diagnosed with an upper respiratory infection.  I believe your postnasal drip is what is triggering your cough and leading to your sputum production.  Take the Augmentin 875 twice daily with food for 7 days for treatment of your upper respiratory infection.  Use the Atrovent nasal spray, 2 squirts up each nostril every 6 hours, as needed for nasal congestion and to stop postnasal drip.  Use the Tessalon Perles every 8 hours during the day as needed for cough.  Take them with a small sip of water.  They may give you some numbness to the base of your tongue, or metallic taste in her mouth, this is normal.  You may also use over-the-counter cough preparations such as Delsym, Robitussin, or Zarbee's.  If your symptoms do not improve, or they worsen, either return for reevaluation or see your primary care provider.

## 2022-11-03 NOTE — ED Provider Notes (Signed)
[facility]    CSN: [id number] Arrival date & time: 11/03/22  1039      History   Chief Complaint Chief Complaint  Patient presents with   Cough   Nasal Congestion    HPI Travis Bila. is a 89 y.o. male.   HPI  89 year old male with significant past medical history to include complete heart block with pacemaker, CVA, hypertension, hypothyroidism, hyperlipidemia, and chronic kidney disease presents for evaluation of 10 days worth of respiratory symptoms.  He does endorse some nasal congestion with green nasal discharge but also a cough that is productive for gray sputum.  He reports that he had a sore throat at the outset of symptoms but that has resolved.  This is not associate with fever, shortness of breath, or wheezing.  Past Medical History:  Diagnosis Date   Allergic rhinitis    Anemia, unspecified    Carotid arterial disease (HCC)    mild   CKD (chronic kidney disease), stage III (HCC)    Colonic polyp    Complete heart block (HCC)    a. 07/2018 s/p SJM Assurity MRI model [id number] (ser # [id number]).   Diastolic dysfunction    a. 10/2016 Echo: EF 55-60%, no rwma, Gr1 DD. Very mild AS. Triv AI.   Dizziness and giddiness    Family history of adverse reaction to anesthesia    Heel spur    Hyperlipidemia    Hypertension    Hypothyroidism    OA (osteoarthritis)    Presence of permanent cardiac pacemaker 08/09/2018   PVC's (premature ventricular contractions)    Stroke (HCC) 07/2016   mini    Tinea cruris     Patient Active Problem List   Diagnosis Date Noted   Chronic anticoagulation 01/08/2020   Presence of permanent cardiac pacemaker 01/08/2020   Paroxysmal atrial fibrillation (HCC) 01/03/2020   Secondary hypercoagulable state (HCC) 01/03/2020   Complete heart block (HCC) 08/08/2018   Lumbar spondylosis 03/22/2017   Chronic bilateral low back pain without sciatica 03/22/2017   Facet arthropathy, lumbar 03/22/2017   PVC (premature ventricular  contraction) 11/23/2013   Hypertension     Past Surgical History:  Procedure Laterality Date   APPENDECTOMY     BACK SURGERY     CIRCUMCISION     GANGLION CYST EXCISION     MOHS SURGERY     PACEMAKER IMPLANT N/A 08/09/2018   Procedure: PACEMAKER IMPLANT;  Surgeon: Regan Lemming, MD;  Location: [facility];  Service: Cardiovascular;  Laterality: N/A;   PACEMAKER INSERTION  08/09/2018   TEMPORARY PACEMAKER N/A 08/08/2018   Procedure: TEMPORARY PACEMAKER;  Surgeon: Yvonne Kendall, MD;  Location: [facility];  Service: Cardiovascular;  Laterality: N/A;       Home Medications    Prior to Admission medications   Medication Sig Start Date End Date Taking? Authorizing Provider  amoxicillin-clavulanate (AUGMENTIN) 875-125 MG tablet Take 1 tablet by mouth every 12 (twelve) hours for 7 days. 11/03/22 11/10/22 Yes Becky Augusta, NP  benzonatate (TESSALON) 100 MG capsule Take 2 capsules (200 mg total) by mouth every 8 (eight) hours. 11/03/22  Yes Becky Augusta, NP  ipratropium (ATROVENT) 0.06 % nasal spray Place 2 sprays into both nostrils 4 (four) times daily. 11/03/22  Yes Becky Augusta, NP  acetaminophen (TYLENOL) 500 MG tablet Take 500 mg by mouth daily as needed.    [provider]  benazepril-hydrochlorthiazide (LOTENSIN HCT) 20-12.5 MG tablet TAKE 1 TABLET BY MOUTH ONCE DAILY  11/16/21   [place] Ouch, MD  celecoxib (CELEBREX) 200 MG capsule Take 200 mg by mouth daily.    [provider]  desvenlafaxine (PRISTIQ) 25 MG 24 hr tablet Take 1 tablet by mouth daily. 05/07/22 05/07/23  [provider]  diltiazem (CARDIZEM CD) 120 MG 24 hr capsule TAKE 1 CAPSULE BY MOUTH ONCE DAILY 11/16/21   [place] Ouch, MD  doxazosin (CARDURA) 4 MG tablet Take 4 mg by mouth daily.    [provider]  ELIQUIS 2.5 MG TABS tablet TAKE 1 TABLET BY MOUTH TWICE DAILY 04/20/22   [place] Ouch, MD  finasteride (PROSCAR) 5 MG tablet Take 5 mg by mouth daily.     [provider]  hydrALAZINE (APRESOLINE) 10 MG tablet Take 10 mg by mouth 2 (two) times daily. Take as needed for blood pressure greater than 170/90 01/18/20   [provider]  hydrocortisone 2.5 % cream Apply 1 application topically 2 (two) times daily. 12/13/19   [provider]  levothyroxine (SYNTHROID) 100 MCG tablet Take 100 mcg by mouth daily. 11/19/19   [provider]  meclizine (ANTIVERT) 25 MG tablet Take 25 mg by mouth 3 (three) times daily as needed. 08/25/21   [provider]  Multiple Vitamins-Minerals (MULTIVITAMIN ADULTS 50+ PO) Take by mouth.    [provider]  omeprazole (PRILOSEC) 10 MG capsule Take 20 mg by mouth daily.    [provider]  polyethylene glycol (MIRALAX / GLYCOLAX) packet Take 17 g by mouth daily.    [provider]  potassium chloride SA (K-DUR,KLOR-CON) 20 MEQ tablet Take 20 mEq by mouth 2 (two) times daily.    [provider]  rosuvastatin (CRESTOR) 10 MG tablet Take 10 mg by mouth at bedtime. 01/18/20   [provider]    Family History Family History  Problem Relation Age of Onset   Hypertension Mother    Dementia Mother    Myasthenia gravis Father    Lung cancer Sister     Social History Social History   Tobacco Use   Smoking status: Former    Years: 4    Types: Cigarettes   Smokeless tobacco: Never  Vaping Use   Vaping Use: Never used  Substance Use Topics   Alcohol use: Yes    Alcohol/week: 2.0 standard drinks of alcohol    Types: 2 Standard drinks or equivalent per week    Comment: weekly   Drug use: No     Allergies   Morphine and related, Phenol, and Tetracyclines & related   Review of Systems Review of Systems  Constitutional:  Negative for fever.  HENT:  Positive for congestion, rhinorrhea and sore throat. Negative for ear pain.   Respiratory:  Positive for cough. Negative for shortness of breath and wheezing.      Physical  Exam Triage Vital Signs ED Triage Vitals [11/03/22 1114]  Enc Vitals Group     BP      Pulse      Resp      Temp      Temp src      SpO2      Weight      Height      Head Circumference      Peak Flow      Pain Score 0     Pain Loc      Pain Edu?      Excl. in GC?    No data found.  Updated  Vital Signs BP (!) 157/80 (BP Location: Left Arm)   Pulse 79   Temp 98.3 F (36.8 C) (Oral)   Resp 16   SpO2 94%   Visual Acuity Right Eye Distance:   Left Eye Distance:   Bilateral Distance:    Right Eye Near:   Left Eye Near:    Bilateral Near:     Physical Exam Vitals and nursing note reviewed.  Constitutional:      Appearance: Normal appearance. He is not ill-appearing.  HENT:     Head: Normocephalic and atraumatic.     Right Ear: Tympanic membrane, ear canal and external ear normal. There is no impacted cerumen.     Left Ear: Tympanic membrane, ear canal and external ear normal. There is no impacted cerumen.     Nose: Congestion and rhinorrhea present.     Comments: Nasal mucosa is erythematous and edematous with yellow-green discharge in both nares.    Mouth/Throat:     Mouth: Mucous membranes are moist.     Pharynx: Oropharynx is clear. No oropharyngeal exudate or posterior oropharyngeal erythema.  Cardiovascular:     Rate and Rhythm: Normal rate and regular rhythm.     Pulses: Normal pulses.     Heart sounds: Normal heart sounds. No murmur heard.    No friction rub. No gallop.  Pulmonary:     Effort: Pulmonary effort is normal.     Breath sounds: Normal breath sounds. No wheezing, rhonchi or rales.  Musculoskeletal:     Cervical back: Normal range of motion and neck supple.  Lymphadenopathy:     Cervical: No cervical adenopathy.  Skin:    General: Skin is warm and dry.     Capillary Refill: Capillary refill takes less than 2 seconds.  Neurological:     Mental Status: He is alert and oriented to person, place, and time.      UC Treatments / Results   Labs (all labs ordered are listed, but only abnormal results are displayed) Labs Reviewed - No data to display  EKG   Radiology No results found.  Procedures Procedures (including critical care time)  Medications Ordered in UC Medications - No data to display  Initial Impression / Assessment and Plan / UC Course  I have reviewed the triage vital signs and the nursing notes.  Pertinent labs & imaging results that were available during my care of the patient were reviewed by me and considered in my medical decision making (see chart for details).   Patient is a nontoxic-appearing 89 year old male in for evaluation of 10 days worth of respiratory symptoms as outlined in HPI above.  His most significant complaint is chest congestion and he states he is coughing up gray sputum.  He also has runny nose and nasal congestion and has green nasal discharge.  No fever, shortness breath, or wheezing.  On exam he does have inflamed nasal mucosa with purulent discharge in both nares.  His lungs are clear to auscultation all fields.  I suspect that his cough is secondary to postnasal drip.  Given the patient's age and duration of symptoms I feel a trial of antibiotics is warranted.  CMP from [facility] dated 08/10/2022 shows a creatinine of 1.9.  His calculated creatinine clearance is 34.  This does not require renal dosing of antibiotics.  I will start him on Augmentin 875 twice daily for 7 days for treatment of upper respiratory infection.  I will also prescribe Tessalon Perles to help with cough.  I will also prescribe Atrovent nasal spray to help with nasal congestion.   Final Clinical Impressions(s) / UC Diagnoses   Final diagnoses:  Upper respiratory tract infection, unspecified type  Acute cough     Discharge Instructions      You have been seen today for evaluation of respiratory symptoms and have been diagnosed with an upper respiratory infection.  I believe your postnasal drip is  what is triggering your cough and leading to your sputum production.  Take the Augmentin 875 twice daily with food for 7 days for treatment of your upper respiratory infection.  Use the Atrovent nasal spray, 2 squirts up each nostril every 6 hours, as needed for nasal congestion and to stop postnasal drip.  Use the Tessalon Perles every 8 hours during the day as needed for cough.  Take them with a small sip of water.  They may give you some numbness to the base of your tongue, or metallic taste in her mouth, this is normal.  You may also use over-the-counter cough preparations such as Delsym, Robitussin, or Zarbee's.  If your symptoms do not improve, or they worsen, either return for reevaluation or see your primary care provider.     ED Prescriptions     Medication Sig Dispense Auth. Provider   amoxicillin-clavulanate (AUGMENTIN) 875-125 MG tablet Take 1 tablet by mouth every 12 (twelve) hours for 7 days. 14 tablet Becky Augusta, NP   benzonatate (TESSALON) 100 MG capsule Take 2 capsules (200 mg total) by mouth every 8 (eight) hours. 21 capsule Becky Augusta, NP   ipratropium (ATROVENT) 0.06 % nasal spray Place 2 sprays into both nostrils 4 (four) times daily. 15 mL Becky Augusta, NP      PDMP not reviewed this encounter.   Becky Augusta, NP 11/03/22 1151

## 2022-11-10 ENCOUNTER — Ambulatory Visit
Admission: EM | Admit: 2022-11-10 | Discharge: 2022-11-10 | Disposition: A | Discharge status: Home / Self Care | Payer: Medicare Other | Attending: Physician Assistant | Admitting: Physician Assistant

## 2022-11-10 DIAGNOSIS — Z23 Encounter for immunization: Secondary | ICD-10-CM | POA: Diagnosis not present

## 2022-11-10 DIAGNOSIS — S51812A Laceration without foreign body of left forearm, initial encounter: Secondary | ICD-10-CM

## 2022-11-10 MED ORDER — TETANUS-DIPHTH-ACELL PERTUSSIS 5-2.5-18.5 LF-MCG/0.5 IM SUSY
0.5000 mL | PREFILLED_SYRINGE | Freq: Once | INTRAMUSCULAR | Status: AC
  Administered 2022-11-10: 0.5 mL via INTRAMUSCULAR

## 2022-11-10 NOTE — Discharge Instructions (Signed)
-  Clean the area with soap and water every day, apply a tiny amount of Neosporin where the wound is still open and cover with bandage. - As discussed, I am unsure if the darkened tissue under the flap is dried blood or dead tissue.  If it is not starting to improve or the symptoms worsen over the next week you should follow-up with your primary care provider or give [place] a call back and I can place a referral to wound care. - Your tetanus immunization has been updated today.

## 2022-11-10 NOTE — ED Provider Notes (Signed)
[facility]    CSN: [id number] Arrival date & time: 11/10/22  1539      History   Chief Complaint Chief Complaint  Patient presents with   Abrasion    HPI Travis [name]. is a 89 y.o. male presenting for evaluation of a skin tear.  Patient says that about 1 week ago he was scratched by his dog.  Reports that he has had bruising and swelling around the skin flap.  Of note, he is anticoagulated with Eliquis.  He states that he cleaned the area initially really well and then applied a skin adhesive over top.  Reports that he just wants to make sure the area is healing well.  He says it is not painful.  He denies any bleeding or drainage.  No fever. Patient seen 11/03/22 for acute sinusitis and treated with Augmentin x 7 days. He will complete the antibiotics today.  He would like to have a tetanus immunization updated because he cannot recall the last tetanus immunization he had.  HPI  Past Medical History:  Diagnosis Date   Allergic rhinitis    Anemia, unspecified    Carotid arterial disease (HCC)    mild   CKD (chronic kidney disease), stage III (HCC)    Colonic polyp    Complete heart block (HCC)    a. 07/2018 s/p SJM Assurity MRI model [id number] (ser # [id number]).   Diastolic dysfunction    a. 10/2016 Echo: EF 55-60%, no rwma, Gr1 DD. Very mild AS. Triv AI.   Dizziness and giddiness    Family history of adverse reaction to anesthesia    Heel spur    Hyperlipidemia    Hypertension    Hypothyroidism    OA (osteoarthritis)    Presence of permanent cardiac pacemaker 08/09/2018   PVC's (premature ventricular contractions)    Stroke (HCC) 07/2016   mini    Tinea cruris     Patient Active Problem List   Diagnosis Date Noted   Chronic anticoagulation 01/08/2020   Presence of permanent cardiac pacemaker 01/08/2020   Paroxysmal atrial fibrillation (HCC) 01/03/2020   Secondary hypercoagulable state (HCC) 01/03/2020   Complete heart block (HCC) 08/08/2018   Lumbar  spondylosis 03/22/2017   Chronic bilateral low back pain without sciatica 03/22/2017   Facet arthropathy, lumbar 03/22/2017   PVC (premature ventricular contraction) 11/23/2013   Hypertension     Past Surgical History:  Procedure Laterality Date   APPENDECTOMY     BACK SURGERY     CIRCUMCISION     GANGLION CYST EXCISION     MOHS SURGERY     PACEMAKER IMPLANT N/A 08/09/2018   Procedure: PACEMAKER IMPLANT;  Surgeon: Regan Lemming, MD;  Location: [facility];  Service: Cardiovascular;  Laterality: N/A;   PACEMAKER INSERTION  08/09/2018   TEMPORARY PACEMAKER N/A 08/08/2018   Procedure: TEMPORARY PACEMAKER;  Surgeon: Yvonne Kendall, MD;  Location: [facility];  Service: Cardiovascular;  Laterality: N/A;       Home Medications    Prior to Admission medications   Medication Sig Start Date End Date Taking? Authorizing Provider  acetaminophen (TYLENOL) 500 MG tablet Take 500 mg by mouth daily as needed.   Yes [provider]  amoxicillin-clavulanate (AUGMENTIN) 875-125 MG tablet Take 1 tablet by mouth every 12 (twelve) hours for 7 days. 11/03/22 11/10/22 Yes Becky Augusta, NP  benazepril-hydrochlorthiazide (LOTENSIN HCT) 20-12.5 MG tablet TAKE 1 TABLET BY MOUTH ONCE DAILY 11/16/21  Yes Lorine Bears  A, MD  benzonatate (TESSALON) 100 MG capsule Take 2 capsules (200 mg total) by mouth every 8 (eight) hours. 11/03/22  Yes Becky Augusta, NP  celecoxib (CELEBREX) 200 MG capsule Take 200 mg by mouth daily.   Yes [provider]  desvenlafaxine (PRISTIQ) 25 MG 24 hr tablet Take 1 tablet by mouth daily. 05/07/22 05/07/23 Yes [provider]  diltiazem (CARDIZEM CD) 120 MG 24 hr capsule TAKE 1 CAPSULE BY MOUTH ONCE DAILY 11/16/21  Yes Iran Ouch, MD  doxazosin (CARDURA) 4 MG tablet Take 4 mg by mouth daily.   Yes [provider]  ELIQUIS 2.5 MG TABS tablet TAKE 1 TABLET BY MOUTH TWICE DAILY 04/20/22  Yes [place] Ouch, MD  finasteride  (PROSCAR) 5 MG tablet Take 5 mg by mouth daily.   Yes [provider]  hydrALAZINE (APRESOLINE) 10 MG tablet Take 10 mg by mouth 2 (two) times daily. Take as needed for blood pressure greater than 170/90 01/18/20  Yes [provider]  hydrocortisone 2.5 % cream Apply 1 application topically 2 (two) times daily. 12/13/19  Yes [provider]  ipratropium (ATROVENT) 0.06 % nasal spray Place 2 sprays into both nostrils 4 (four) times daily. 11/03/22  Yes Becky Augusta, NP  levothyroxine (SYNTHROID) 100 MCG tablet Take 100 mcg by mouth daily. 11/19/19  Yes [provider]  meclizine (ANTIVERT) 25 MG tablet Take 25 mg by mouth 3 (three) times daily as needed. 08/25/21  Yes [provider]  Multiple Vitamins-Minerals (MULTIVITAMIN ADULTS 50+ PO) Take by mouth.   Yes [provider]  omeprazole (PRILOSEC) 10 MG capsule Take 20 mg by mouth daily.   Yes [provider]  polyethylene glycol (MIRALAX / GLYCOLAX) packet Take 17 g by mouth daily.   Yes [provider]  potassium chloride SA (K-DUR,KLOR-CON) 20 MEQ tablet Take 20 mEq by mouth 2 (two) times daily.   Yes [provider]  rosuvastatin (CRESTOR) 10 MG tablet Take 10 mg by mouth at bedtime. 01/18/20  Yes [provider]    Family History Family History  Problem Relation Age of Onset   Hypertension Mother    Dementia Mother    Myasthenia gravis Father    Lung cancer Sister     Social History Social History   Tobacco Use   Smoking status: Former    Years: 4    Types: Cigarettes   Smokeless tobacco: Never  Vaping Use   Vaping Use: Never used  Substance Use Topics   Alcohol use: Yes    Alcohol/week: 2.0 standard drinks of alcohol    Types: 2 Standard drinks or equivalent per week    Comment: weekly   Drug use: No     Allergies   Morphine and codeine, Phenol, and Tetracyclines & related   Review of Systems Review of Systems  Constitutional:   Negative for fatigue and fever.  Musculoskeletal:  Negative for arthralgias and joint swelling.  Skin:  Positive for color change and wound.  Neurological:  Negative for weakness.     Physical Exam Triage Vital Signs ED Triage Vitals  Enc Vitals Group     BP      Pulse      Resp      Temp      Temp src      SpO2      Weight      Height      Head Circumference      Peak Flow  Pain Score      Pain Loc      Pain Edu?      Excl. in GC?    No data found.  Updated Vital Signs BP (!) 162/74 (BP Location: Left Arm)   Pulse 67   Temp 98.3 F (36.8 C) (Oral)   Resp 16   Ht 5\' 11"  (1.803 m)   Wt 206 lb (93.4 kg)   SpO2 94%   BMI 28.73 kg/m     Physical Exam Vitals and nursing note reviewed.  Constitutional:      General: He is not in acute distress.    Appearance: Normal appearance. He is well-developed. He is not ill-appearing.  HENT:     Head: Normocephalic and atraumatic.  Eyes:     General: No scleral icterus.    Conjunctiva/sclera: Conjunctivae normal.  Cardiovascular:     Rate and Rhythm: Normal rate and regular rhythm.     Heart sounds: Normal heart sounds.  Pulmonary:     Effort: Pulmonary effort is normal. No respiratory distress.     Breath sounds: Normal breath sounds.  Musculoskeletal:     Cervical back: Neck supple.  Skin:    General: Skin is warm and dry.     Capillary Refill: Capillary refill takes less than 2 seconds.     Findings: Wound present.     Comments: Skin flap left dorsal forearm. Dark area under skin flap. Small region of open wound ~2 cm. No bleeding or drainage. No tenderness. Mild swelling and ecchymosis of area around skin flap.  Neurological:     General: No focal deficit present.     Mental Status: He is alert. Mental status is at baseline.     Motor: No weakness.     Gait: Gait normal.  Psychiatric:        Mood and Affect: Mood normal.        Behavior: Behavior normal.       UC Treatments / Results  Labs (all  labs ordered are listed, but only abnormal results are displayed) Labs Reviewed - No data to display  EKG   Radiology No results found.  Procedures Procedures (including critical care time)  Medications Ordered in UC Medications  Tdap (BOOSTRIX) injection 0.5 mL (has no administration in time range)    Initial Impression / Assessment and Plan / UC Course  I have reviewed the triage vital signs and the nursing notes.  Pertinent labs & imaging results that were available during my care of the patient were reviewed by me and considered in my medical decision making (see chart for details).   89 year old male with history of chronic anticoagulation presents for skin flap wound of left dorsal forearm after accidental scratch by dog 1 week ago.  He reports that he cleaned the area well initially and covered with skin adhesive.  Would like to have tetanus updated.  He has been on the Augmentin for the past 1 week.  I have included an image in the chart of patient's wound which does appear to be healing well.  There is dark area under the skin flap which could be dried blood versus devitalized/necrotic tissue.  Patient is confident it is just dried blood.  Advised him to keep the area clean and dry.  We discussed wound care.  I advised him to follow-up with his PCP.  I did offer to place a referral to wound care for him but he declined.  Reviewed ED precautions.  Tdap  was updated.   Final Clinical Impressions(s) / UC Diagnoses   Final diagnoses:  Skin tear of left forearm without complication, initial encounter     Discharge Instructions      -Clean the area with soap and water every day, apply a tiny amount of Neosporin where the wound is still open and cover with bandage. - As discussed, I am unsure if the darkened tissue under the flap is dried blood or dead tissue.  If it is not starting to improve or the symptoms worsen over the next week you should follow-up with your primary care  provider or give [place] a call back and I can place a referral to wound care. - Your tetanus immunization has been updated today.     ED Prescriptions   None    PDMP not reviewed this encounter.   Shirlee Latch, PA-C 11/10/22 1635

## 2022-11-10 NOTE — ED Triage Notes (Signed)
Pt c/o abrasion in L arm x7 days. States his dog came up to him & paw scratched his arm. Denies any pain or discharge.

## 2022-11-12 ENCOUNTER — Ambulatory Visit: Payer: Medicare Other | Admitting: Cardiology

## 2022-11-12 NOTE — Progress Notes (Deleted)
Cardiology Office Note:   Date:  11/24/2022  ID:  Travis Ellis., DOB 07-Nov-1929, MRN [id number]  History of Present Illness:   Travis [name]. is a 89 y.o. male with a past medical history of paroxysmal atrial fibrillation, complete heart block status post permanent pacemaker placement, PVCs, hypertension, mild aortic stenosis, CKD stage III, CVA in 07/2016, prior tobacco use who presents today for follow-up on paroxysmal atrial fibrillation.  Dual-chamber pacemaker implantation 07/2018 after presenting with complete heart block.  He was found to have atrial fibrillation on device interrogation and has since been on apixaban.  Most recent echocardiogram 10/2020 demonstrated an LVEF of 55 to 60%, no regional wall motion analysis, G1 DD, mild mitral regurgitation.  He will continue to be followed by EP with Dr. Lalla Brothers.  He was last seen in clinic 05/18/2022 where overall he was doing fairly well.  There were no changes to his medication and no further testing was needed at this time.  He returns to clinic today  ROS: ***  Studies Reviewed:    EKG:  ***  TTE 11/04/20 1. Left ventricular ejection fraction, by estimation, is 55 to 60%. The  left ventricle has normal function. The left ventricle has no regional  wall motion abnormalities. There is mild left ventricular hypertrophy.  Left ventricular diastolic parameters  are consistent with Grade I diastolic dysfunction (impaired relaxation).   2. Right ventricular systolic function is normal. The right ventricular  size is mildly enlarged.   3. The mitral valve is normal in structure. Mild mitral valve  regurgitation.   4. The aortic valve is tricuspid. Aortic valve regurgitation is trivial.  Mild to moderate aortic valve sclerosis/calcification is present, without  any evidence of aortic stenosis.   5. The inferior vena cava is normal in size with greater than 50%  respiratory variability, suggesting right atrial pressure of 3 mmHg.     Risk Assessment/Calculations:    CHA2DS2-VASc Score = 5  {Confirm score is correct.  If not, click here to update score.  REFRESH note.  :1} This indicates a 7.2% annual risk of stroke. The patient's score is based upon: CHF History: 0 HTN History: 1 Diabetes History: 0 Stroke History: 2 Vascular Disease History: 0 Age Score: 2 Gender Score: 0   {This patient has a significant risk of stroke if diagnosed with atrial fibrillation.  Please consider VKA or DOAC agent for anticoagulation if the bleeding risk is acceptable.   You can also use the SmartPhrase .[email or web address] for documentation.   :[id number]} No BP recorded.  {Refresh Note OR Click here to enter BP  :1}***        Physical Exam:   VS:  There were no vitals taken for this visit.   Wt Readings from Last 3 Encounters:  11/10/22 206 lb (93.4 kg)  05/18/22 208 lb 12.8 oz (94.7 kg)  01/20/22 212 lb (96.2 kg)     GEN: Well nourished, well developed in no acute distress NECK: No JVD; No carotid bruits CARDIAC: ***RRR, no murmurs, rubs, gallops RESPIRATORY:  Clear to auscultation without rales, wheezing or rhonchi  ABDOMEN: Soft, non-tender, non-distended EXTREMITIES:  No edema; No deformity   ASSESSMENT AND PLAN:   ***    {Are you ordering a CV Procedure (e.g. stress test, cath, DCCV, TEE, etc)?   Press F2        :[id number]}   Signed, Ashaya Raftery, NP   This encounter was created in error - please disregard.This encounter was  created in error - please disregard. This encounter was created in error - please disregard.

## 2022-11-15 ENCOUNTER — Other Ambulatory Visit: Payer: Self-pay | Admitting: Cardiovascular Disease

## 2022-11-15 NOTE — Telephone Encounter (Signed)
11/12/22 provider note not complete at this time.  Will watch for signed note to confirm no med changes.

## 2022-11-17 ENCOUNTER — Ambulatory Visit: Payer: Medicare Other | Admitting: Cardiovascular Disease

## 2022-11-17 ENCOUNTER — Telehealth: Payer: Self-pay | Admitting: Cardiology

## 2022-11-17 ENCOUNTER — Other Ambulatory Visit: Payer: Self-pay

## 2022-11-17 MED ORDER — BENAZEPRIL-HYDROCHLOROTHIAZIDE 20-12.5 MG PO TABS: 1.0000 | ORAL_TABLET | Freq: Every day | ORAL | 0 refills | Status: DC

## 2022-11-17 MED ORDER — DILTIAZEM HCL ER COATED BEADS 120 MG PO CP24: 120.0000 mg | ORAL_CAPSULE | Freq: Every day | ORAL | 0 refills | Status: DC

## 2022-11-17 NOTE — Telephone Encounter (Signed)
*  STAT* If patient is at the pharmacy, call can be transferred to refill team.   1. Which medications need to be refilled? (please list name of each medication and dose if known) benazepril-hydrochlorthiazide (LOTENSIN HCT) 20-12.5 MG tablet ; diltiazem (CARDIZEM CD) 120 MG 24 hr capsule   2. Which pharmacy/location (including street and city if local pharmacy) is medication to be sent to? [facility] - [place], [place] - [address]   3. Do they need a 30 day or 90 day supply? 90

## 2022-11-17 NOTE — Telephone Encounter (Signed)
Refilled patient's requested Rx and sent to pharmacy of choice

## 2022-11-17 NOTE — Telephone Encounter (Signed)
Travis [name], did you advise any mediation changes at 11/12/22 office visit?

## 2022-11-17 NOTE — Telephone Encounter (Signed)
He did not show for the appointment on 5/17, he cancelled.

## 2022-11-19 ENCOUNTER — Other Ambulatory Visit: Payer: Self-pay | Admitting: Cardiovascular Disease

## 2022-11-19 NOTE — Telephone Encounter (Signed)
Confirmed with pharmacy that rx sent on 11/17/22 was received and has been picked up by patient.

## 2022-11-24 NOTE — Progress Notes (Signed)
This encounter was created in error - please disregard.

## 2022-12-08 ENCOUNTER — Ambulatory Visit (INDEPENDENT_AMBULATORY_CARE_PROVIDER_SITE_OTHER): Payer: Medicare Other

## 2022-12-08 DIAGNOSIS — I442 Atrioventricular block, complete: Secondary | ICD-10-CM

## 2022-12-08 LAB — CUP PACEART REMOTE DEVICE CHECK
Battery Remaining Longevity: 71 mo
Battery Remaining Percentage: 59 %
Battery Voltage: 2.99 V
Brady Statistic AP VP Percent: 41 %
Brady Statistic AP VS Percent: 1 %
Brady Statistic AS VP Percent: 59 %
Brady Statistic AS VS Percent: 1 %
Brady Statistic RA Percent Paced: 40 %
Brady Statistic RV Percent Paced: 99 %
Date Time Interrogation Session: 20240612020013
Implantable Lead Connection Status: 753985
Implantable Lead Connection Status: 753985
Implantable Lead Implant Date: 20200212
Implantable Lead Implant Date: 20200212
Implantable Lead Location: 753859
Implantable Lead Location: 753860
Implantable Pulse Generator Implant Date: 20200212
Lead Channel Impedance Value: 400 Ohm
Lead Channel Impedance Value: 440 Ohm
Lead Channel Pacing Threshold Amplitude: 0.625 V
Lead Channel Pacing Threshold Amplitude: 0.625 V
Lead Channel Pacing Threshold Pulse Width: 0.5 ms
Lead Channel Pacing Threshold Pulse Width: 0.5 ms
Lead Channel Sensing Intrinsic Amplitude: 0.9 mV
Lead Channel Sensing Intrinsic Amplitude: 12 mV
Lead Channel Setting Pacing Amplitude: 0.875
Lead Channel Setting Pacing Amplitude: 1.625
Lead Channel Setting Pacing Pulse Width: 0.5 ms
Lead Channel Setting Sensing Sensitivity: 6 mV
Pulse Gen Model: 2272
Pulse Gen Serial Number: 9107114

## 2022-12-16 ENCOUNTER — Encounter: Payer: Self-pay | Admitting: Cardiology

## 2022-12-16 ENCOUNTER — Ambulatory Visit: Payer: Medicare Other | Attending: Cardiovascular Disease | Admitting: Cardiology

## 2022-12-16 VITALS — BP 164/77 | HR 81 | Ht 70.0 in | Wt 208.0 lb

## 2022-12-16 DIAGNOSIS — I359 Nonrheumatic aortic valve disorder, unspecified: Secondary | ICD-10-CM | POA: Diagnosis not present

## 2022-12-16 DIAGNOSIS — I442 Atrioventricular block, complete: Secondary | ICD-10-CM

## 2022-12-16 DIAGNOSIS — Z7901 Long term (current) use of anticoagulants: Secondary | ICD-10-CM

## 2022-12-16 DIAGNOSIS — I10 Essential (primary) hypertension: Secondary | ICD-10-CM

## 2022-12-16 DIAGNOSIS — I480 Paroxysmal atrial fibrillation: Secondary | ICD-10-CM

## 2022-12-16 DIAGNOSIS — Z8673 Personal history of transient ischemic attack (TIA), and cerebral infarction without residual deficits: Secondary | ICD-10-CM

## 2022-12-16 NOTE — Progress Notes (Signed)
Cardiology Office Note:  .   Date:  12/16/2022  ID:  Travis Ellis., DOB Feb 12, 1930, MRN [id number] PCP: Marguarite Arbour, MD  [facility] Providers Cardiologist:  Lorine Bears, MD Electrophysiologist:  Lanier Prude, MD    History of Present Illness: Travis Kitchen   Travis Ocon. is a 89 y.o. male with a past medical history of paroxysmal atrial fibrillation, complete heart block status post permanent pacemaker placement, PVCs, hypertension, mild aortic stenosis, CKD stage III, CVA in 07/2016, prior tobacco use who presents today to follow-up on his paroxysmal atrial fibrillation.  He had a dual-chamber pacemaker "done in 07/2018 after presenting with complete heart block.  He was found to have atrial fibrillation on device interrogation and has since been on apixaban.  Recent echocardiogram in 10/2020 demonstrated LVEF 55 to 60%, no regional wall motion abnormality, G1 DD, mild mitral regurgitation.  He continues to be followed by EP with Dr. Lalla Brothers.  He was last seen in clinic 05/18/2022 where overall he was doing fairly well.  There were no changes in his medication or further testing that was needed at that time  He has had several visits to the [facility] urgent care since he was last seen for various complaints of cough and nasal congestion and 1 issue of an abrasion from a dog bite.  He returns to clinic today stating that overall he has been doing fairly well.  He did have an issue with a cold and had to take an inhaler and something for cough.  He denies any chest pain or shortness of breath.  Continues to have fatigue and occasional swelling to his bilateral lower extremities.  He has been compliant with his medication regimen.  He has tolerated OAC without bleeding or noted blood in stool or urine.  He denies any hospitalizations or visits to the emergency department.  He also continues to follow with the EP.  ROS: 10 point review of systems has been reviewed and considered  negative with exception of what is listed in the HPI.  Studies Reviewed: Travis Kitchen      EKG: Atrially sensed ventricularly paced rhythm at a rate of 81 bpm  TTE 11/04/20 1. Left ventricular ejection fraction, by estimation, is 55 to 60%. The  left ventricle has normal function. The left ventricle has no regional  wall motion abnormalities. There is mild left ventricular hypertrophy.  Left ventricular diastolic parameters  are consistent with Grade I diastolic dysfunction (impaired relaxation).   2. Right ventricular systolic function is normal. The right ventricular  size is mildly enlarged.   3. The mitral valve is normal in structure. Mild mitral valve  regurgitation.   4. The aortic valve is tricuspid. Aortic valve regurgitation is trivial.  Mild to moderate aortic valve sclerosis/calcification is present, without  any evidence of aortic stenosis.   5. The inferior vena cava is normal in size with greater than 50%  respiratory variability, suggesting right atrial pressure of 3 mmHg.  Risk Assessment/Calculations:    CHA2DS2-VASc Score = 5   This indicates a 7.2% annual risk of stroke. The patient's score is based upon: CHF History: 0 HTN History: 1 Diabetes History: 0 Stroke History: 2 Vascular Disease History: 0 Age Score: 2 Gender Score: 0    HYPERTENSION CONTROL Vitals:   12/16/22 1052 12/16/22 1116  BP: (!) 150/68 (!) 164/77    The patient's blood pressure is elevated above target today.  In order to address the patient's elevated BP:  Blood pressure will be monitored at home to determine if medication changes need to be made. (patient had not taking his medications as of yet this morning)          Physical Exam:   VS:  BP (!) 164/77 (BP Location: Right Arm, Patient Position: Sitting, Cuff Size: Normal)   Pulse 81   Ht 5\' 10"  (1.778 m)   Wt 208 lb (94.3 kg)   SpO2 96%   BMI 29.84 kg/m    Wt Readings from Last 3 Encounters:  12/16/22 208 lb (94.3 kg)  11/10/22 206  lb (93.4 kg)  05/18/22 208 lb 12.8 oz (94.7 kg)    GEN: Well nourished, well developed in no acute distress NECK: No JVD; No carotid bruits CARDIAC: RRR, I-II/VI systolic murmur, without rubs or gallops RESPIRATORY:  Clear to auscultation without rales, wheezing or rhonchi  ABDOMEN: Soft, non-tender, non-distended EXTREMITIES: Trace pretibial edema; No deformity   ASSESSMENT AND PLAN: .   Essential hypertension blood pressure today 150/68.  Repeat blood pressure was slightly elevated.  Upon further conversation with patient he has not had his medication as of yet this morning.  He is continued on diltiazem 120 mg daily and benazepril HCTZ 20/12.5 mg daily.  He has been encouraged to continue to monitor his blood pressures at home as well.  Paroxysmal atrial fibrillation without any symptoms of palpitations today.  He remains on Cardizem and apixaban 2.5 mg twice daily (age and serum creatinine reduced dosing criteria) without side effects or concerns with bleeding for CHA2DS2-VASc score of at least 5 for stroke prophylaxis.  EKG today reveals a sensed V paced rhythm.  Complete heart block status post dual-chamber permanent pacemaker.  Device appears to be functioning normally with a sensing and V pacing noted on EKG today.  He continues to follow-up with remote checks and his visits with the EP.  History of CVA with no new deficits.  He remains on apixaban and lieu of aspirin and is continued on rosuvastatin 10 mg at bedtime.  Aortic valve sclerosis without evidence of stenosis and mild mitral regurgitation.  Will continue to monitor with surveillance echocardiograms as needed.       Dispo: Return to clinic to see MD/APP in 6 months or sooner if needed.  He has also been encouraged to continue to maintain all follow-ups with EP  Signed, Camrin Gearheart, NP

## 2022-12-16 NOTE — Patient Instructions (Signed)
Medication Instructions:  No changes at this time.   *If you need a refill on your cardiac medications before your next appointment, please call your pharmacy*   Lab Work: None  If you have labs (blood work) drawn today and your tests are completely normal, you will receive your results only by: MyChart Message (if you have MyChart) OR A paper copy in the mail If you have any lab test that is abnormal or we need to change your treatment, we will call you to review the results.   Testing/Procedures: None   Follow-Up: At [facility], you and your health needs are our priority.  As part of our continuing mission to provide you with exceptional heart care, we have created designated Provider Care Teams.  These Care Teams include your primary Cardiologist (physician) and Advanced Practice Providers (APPs -  Physician Assistants and Nurse Practitioners) who all work together to provide you with the care you need, when you need it.   Your next appointment:   6 month(s)  Provider:   Muhammad Arida, MD or Sheri Hammock, NP     

## 2023-01-03 NOTE — Progress Notes (Signed)
Remote pacemaker transmission.   

## 2023-01-10 ENCOUNTER — Other Ambulatory Visit: Payer: Self-pay | Admitting: Cardiovascular Disease

## 2023-01-10 NOTE — Telephone Encounter (Signed)
Please review

## 2023-01-10 NOTE — Telephone Encounter (Signed)
Prescription refill request for Eliquis received. Indication:afib Last office visit:6/24 Scr:1.6 Age: 89 Weight:94.3  kg   Prescription refilled

## 2023-02-08 ENCOUNTER — Other Ambulatory Visit: Payer: Self-pay | Admitting: Cardiovascular Disease

## 2023-02-08 ENCOUNTER — Other Ambulatory Visit: Payer: Self-pay

## 2023-02-08 MED ORDER — BENAZEPRIL-HYDROCHLOROTHIAZIDE 20-12.5 MG PO TABS: 1.0000 | ORAL_TABLET | Freq: Every day | ORAL | 0 refills | Status: DC

## 2023-02-14 ENCOUNTER — Other Ambulatory Visit: Payer: Self-pay | Admitting: Cardiovascular Disease

## 2023-02-21 ENCOUNTER — Other Ambulatory Visit: Payer: Self-pay | Admitting: Cardiovascular Disease

## 2023-03-04 NOTE — Progress Notes (Unsigned)
Cardiology Office Note Date:  03/07/2023  Patient ID:  Travis [name]., DOB 12/20/1929, MRN [id number] PCP:  Marguarite Arbour, MD  Cardiologist:  Lorine Bears, MD Electrophysiologist: Lanier Prude, MD    Chief Complaint: 1 year device follow-up  History of Present Illness: Travis [name]. is a 89 y.o. male with PMH notable for CHB s/p PPM, parox afib, HTN, CVA, CKD 3; seen today for Lanier Prude, MD for routine electrophysiology followup.  He last saw Dr. Lalla Brothers 12/2021, less than 1% afib burden, feeling well.  On follow-up today, he denies chest pain, chest pressure, palpitations. Is not particularly active d/t knee pain. He also states that when he is active, he gets very tired. This past weekend he went to football game at [facility] and had to take a break when walking back to car.   Diligently takes eliquis BID, no bleeding concerns.   Device Information: St.Jude dual chamber PPM, imp 07/2018; dx CHB   Past Medical History:  Diagnosis Date   Allergic rhinitis    Anemia, unspecified    Carotid arterial disease (HCC)    mild   CKD (chronic kidney disease), stage III (HCC)    Colonic polyp    Complete heart block (HCC)    a. 07/2018 s/p SJM Assurity MRI model [id number] (ser # [id number]).   Diastolic dysfunction    a. 10/2016 Echo: EF 55-60%, no rwma, Gr1 DD. Very mild AS. Triv AI.   Dizziness and giddiness    Family history of adverse reaction to anesthesia    Heel spur    Hyperlipidemia    Hypertension    Hypothyroidism    OA (osteoarthritis)    Presence of permanent cardiac pacemaker 08/09/2018   PVC's (premature ventricular contractions)    Stroke (HCC) 07/2016   mini    Tinea cruris     Past Surgical History:  Procedure Laterality Date   APPENDECTOMY     BACK SURGERY     CIRCUMCISION     GANGLION CYST EXCISION     MOHS SURGERY     PACEMAKER IMPLANT N/A 08/09/2018   Procedure: PACEMAKER IMPLANT;  Surgeon: Regan Lemming, MD;  Location: [facility]  INVASIVE CV LAB;  Service: Cardiovascular;  Laterality: N/A;   PACEMAKER INSERTION  08/09/2018   TEMPORARY PACEMAKER N/A 08/08/2018   Procedure: TEMPORARY PACEMAKER;  Surgeon: Yvonne Kendall, MD;  Location: [facility] INVASIVE CV LAB;  Service: Cardiovascular;  Laterality: N/A;    Current Outpatient Medications  Medication Instructions   acetaminophen (TYLENOL) 500 mg, Oral, Daily PRN   benazepril-hydrochlorthiazide (LOTENSIN HCT) 20-12.5 MG tablet 1 tablet, Oral, Daily   celecoxib (CELEBREX) 200 mg, Oral, Daily   desvenlafaxine (PRISTIQ) 25 MG 24 hr tablet 1 tablet, Oral, Daily   diltiazem (CARDIZEM CD) 120 mg, Oral, Daily   doxazosin (CARDURA) 4 mg, Oral, Daily   ELIQUIS 2.5 MG TABS tablet TAKE 1 TABLET BY MOUTH TWICE DAILY   hydrALAZINE (APRESOLINE) 10 mg, Oral, 2 times daily, Take as needed for blood pressure greater than 170/90   hydrocortisone 2.5 % cream 1 application , Topical, 2 times daily   levothyroxine (SYNTHROID) 100 mcg, Oral, Daily   meclizine (ANTIVERT) 25 mg, Oral, 3 times daily PRN   Multiple Vitamins-Minerals (MULTIVITAMIN ADULTS 50+ PO) Oral   omeprazole (PRILOSEC) 10 mg, Oral, Daily   polyethylene glycol (MIRALAX / GLYCOLAX) 17 g, Oral, Daily   potassium chloride SA (K-DUR,KLOR-CON) 20 MEQ tablet 20 mEq, Oral, 2 times daily  rosuvastatin (CRESTOR) 10 mg, Oral, Daily at bedtime    Social History:  The patient  reports that he has quit smoking. His smoking use included cigarettes. He has never used smokeless tobacco. He reports current alcohol use of about 2.0 standard drinks of alcohol per week. He reports that he does not use drugs.   Family History:  The patient's family history includes Dementia in his mother; Hypertension in his mother; Lung cancer in his sister; Myasthenia gravis in his father.  ROS:  Please see the history of present illness. All other systems are reviewed and otherwise negative.   PHYSICAL EXAM:  VS:  BP (!) 152/82 (BP Location: Left Arm,  Patient Position: Sitting)   Pulse 67   Ht 5\' 10"  (1.778 m)   Wt 205 lb 12.8 oz (93.4 kg)   SpO2 99%   BMI 29.53 kg/m  BMI: Body mass index is 29.53 kg/m.  GEN- The patient is well appearing, alert and oriented x 3 today.   Lungs- Clear to ausculation bilaterally, normal work of breathing.  Heart- Regular rate and rhythm, no murmurs, rubs or gallops Extremities- Trace peripheral edema, warm, dry Skin-  device pocket well-healed, no tethering   Device interrogation done today and reviewed by myself:  Battery 5.7 years Atrial sensing somewhat lower at 1.28mV Lead thresholds, impedence, stable Parox AFlutter episodes, longest 1hr Dependent down to 40bpm Histograms blunted Reduced atrial sensing to 0.12mV Turned on RR   EKG is ordered. Personal review of EKG from today shows:    EKG Interpretation Date/Time:  Monday March 07 2023 09:42:19 EDT Ventricular Rate:  67 PR Interval:    QRS Duration:  202 QT Interval:  500 QTC Calculation: 528 R Axis:   -84  Text Interpretation: Ventricular-paced rhythm Confirmed by Sherie Don [phone]) on 03/07/2023 9:49:03 AM    Recent Labs:  12/03/2022 labwork via care everywhere Cr - 1.6 HgB - 12 Hct - 35.5  No results found for requested labs within last 365 days.  No results found for requested labs within last 365 days.   CrCl cannot be calculated (Patient's most recent lab result is older than the maximum 21 days allowed.).   Wt Readings from Last 3 Encounters:  03/07/23 205 lb 12.8 oz (93.4 kg)  12/16/22 208 lb (94.3 kg)  11/10/22 206 lb (93.4 kg)     Additional studies reviewed include: Previous EP, cardiology notes.   TTE, 11/04/2020  1. Left ventricular ejection fraction, by estimation, is 55 to 60%. The left ventricle has normal function. The left ventricle has no regional wall motion abnormalities. There is mild left ventricular hypertrophy. Left ventricular diastolic parameters are consistent with Grade I diastolic  dysfunction (impaired relaxation).   2. Right ventricular systolic function is normal. The right ventricular size is mildly enlarged.   3. The mitral valve is normal in structure. Mild mitral valve regurgitation.   4. The aortic valve is tricuspid. Aortic valve regurgitation is trivial. Mild to moderate aortic valve sclerosis/calcification is present, without any evidence of aortic stenosis.   5. The inferior vena cava is normal in size with greater than 50% respiratory variability, suggesting right atrial pressure of 3 mmHg.   ASSESSMENT AND PLAN:  #) CHB s/p St. Jude dual chamber PPM Device functioning well Adjusted Atrial sensing level Turned on RR Dependent down to 40bpm  #) parox afib Overall burden low by device Asymptomatic of arrhythmia Most recent labwork at PCP office stable  #) Hypercoag d/t parox afib CHA2DS2-VASc Score =  5 [CHF History: 0, HTN History: 1, Diabetes History: 0, Stroke History: 2, Vascular Disease History: 0, Age Score: 2, Gender Score: 0].  Therefore, the patient's annual risk of stroke is 7.2 %.    Stroke ppx - 2.5mg  eliquis BID, dose appropriately reduced for age, Cr No bleeding concerns  #) HTN Elevated today in office Recommended he check BP 2-3 times per week and record measurements and bring them to PCP       Current medicines are reviewed at length with the patient today.   The patient does not have concerns regarding his medicines.  The following changes were made today:  none  Labs/ tests ordered today include:  Orders Placed This Encounter  Procedures   EKG 12-Lead     Disposition: Follow up with Dr. Lalla Brothers or EP APP in 6 months   Signed, Sherie Don, NP  03/07/23  9:49 AM  Electrophysiology [facility]

## 2023-03-07 ENCOUNTER — Ambulatory Visit: Payer: Medicare Other | Attending: Cardiology | Admitting: Cardiology

## 2023-03-07 VITALS — BP 152/82 | HR 67 | Ht 70.0 in | Wt 205.8 lb

## 2023-03-07 DIAGNOSIS — I442 Atrioventricular block, complete: Secondary | ICD-10-CM

## 2023-03-07 DIAGNOSIS — D6869 Other thrombophilia: Secondary | ICD-10-CM

## 2023-03-07 DIAGNOSIS — I480 Paroxysmal atrial fibrillation: Secondary | ICD-10-CM

## 2023-03-07 DIAGNOSIS — I10 Essential (primary) hypertension: Secondary | ICD-10-CM

## 2023-03-07 DIAGNOSIS — Z950 Presence of cardiac pacemaker: Secondary | ICD-10-CM

## 2023-03-07 LAB — CUP PACEART INCLINIC DEVICE CHECK
Date Time Interrogation Session: 20240909105416
Implantable Lead Connection Status: 753985
Implantable Lead Connection Status: 753985
Implantable Lead Implant Date: 20200212
Implantable Lead Implant Date: 20200212
Implantable Lead Location: 753859
Implantable Lead Location: 753860
Implantable Pulse Generator Implant Date: 20200212
Pulse Gen Model: 2272
Pulse Gen Serial Number: 9107114

## 2023-03-07 NOTE — Patient Instructions (Signed)
Medication Instructions:  Your physician recommends that you continue on your current medications as directed. Please refer to the Current Medication list given to you today.   *If you need a refill on your cardiac medications before your next appointment, please call your pharmacy*   Lab Work: No labs ordered today    Testing/Procedures: No test ordered today    Follow-Up: At [facility], you and your health needs are our priority.  As part of our continuing mission to provide you with exceptional heart care, we have created designated Provider Care Teams.  These Care Teams include your primary Cardiologist (physician) and Advanced Practice Providers (APPs -  Physician Assistants and Nurse Practitioners) who all work together to provide you with the care you need, when you need it.  We recommend signing up for the patient portal called "MyChart".  Sign up information is provided on this After Visit Summary.  MyChart is used to connect with patients for Virtual Visits (Telemedicine).  Patients are able to view lab/test results, encounter notes, upcoming appointments, etc.  Non-urgent messages can be sent to your provider as well.   To learn more about what you can do with MyChart, go to [email or web address].    Your next appointment:   6 month(s)  Provider:   You may see Steffanie Dunn, MD or one of the following Advanced Practice Providers on your designated Care Team:   Sherie Don, NP  Sherie Don NP, recommends checking and keeping a log of your blood pressure 2-3 times per week.   It is best to check your blood pressure 1-2 hours after taking your medications.  Below are some tips that our clinical pharmacists share for home BP monitoring:          Rest 10 minutes before taking your blood pressure.          Don't smoke or drink caffeinated beverages for at least 30 minutes before.          Take your blood pressure before (not after) you eat.          Sit  comfortably with your back supported and both feet on the floor (don't cross your legs).          Elevate your arm to heart level on a table or a desk.          Use the proper sized cuff. It should fit smoothly and snugly around your bare upper arm. There should be enough room to slip a fingertip under the cuff. The bottom edge of the cuff should be 1 inch above the crease of the elbow.

## 2023-03-09 ENCOUNTER — Ambulatory Visit (INDEPENDENT_AMBULATORY_CARE_PROVIDER_SITE_OTHER): Payer: Medicare Other

## 2023-03-09 DIAGNOSIS — I442 Atrioventricular block, complete: Secondary | ICD-10-CM | POA: Diagnosis not present

## 2023-03-09 LAB — CUP PACEART REMOTE DEVICE CHECK
Battery Remaining Longevity: 65 mo
Battery Remaining Percentage: 57 %
Battery Voltage: 2.99 V
Brady Statistic AP VP Percent: 70 %
Brady Statistic AP VS Percent: 1 %
Brady Statistic AS VP Percent: 30 %
Brady Statistic AS VS Percent: 1 %
Brady Statistic RA Percent Paced: 70 %
Brady Statistic RV Percent Paced: 99 %
Date Time Interrogation Session: 20240911023526
Implantable Lead Connection Status: 753985
Implantable Lead Connection Status: 753985
Implantable Lead Implant Date: 20200212
Implantable Lead Implant Date: 20200212
Implantable Lead Location: 753859
Implantable Lead Location: 753860
Implantable Pulse Generator Implant Date: 20200212
Lead Channel Impedance Value: 410 Ohm
Lead Channel Impedance Value: 430 Ohm
Lead Channel Pacing Threshold Amplitude: 0.75 V
Lead Channel Pacing Threshold Amplitude: 2.125 V
Lead Channel Pacing Threshold Pulse Width: 0.5 ms
Lead Channel Pacing Threshold Pulse Width: 0.5 ms
Lead Channel Sensing Intrinsic Amplitude: 1.8 mV
Lead Channel Sensing Intrinsic Amplitude: 12 mV
Lead Channel Setting Pacing Amplitude: 1 V
Lead Channel Setting Pacing Amplitude: 3.625
Lead Channel Setting Pacing Pulse Width: 0.5 ms
Lead Channel Setting Sensing Sensitivity: 6 mV
Pulse Gen Model: 2272
Pulse Gen Serial Number: 9107114

## 2023-03-22 ENCOUNTER — Ambulatory Visit: Payer: Medicare Other

## 2023-03-22 DIAGNOSIS — I442 Atrioventricular block, complete: Secondary | ICD-10-CM

## 2023-03-22 LAB — CUP PACEART REMOTE DEVICE CHECK
Battery Remaining Longevity: 64 mo
Battery Remaining Percentage: 56 %
Battery Voltage: 2.99 V
Brady Statistic AP VP Percent: 64 %
Brady Statistic AP VS Percent: 1 %
Brady Statistic AS VP Percent: 36 %
Brady Statistic AS VS Percent: 1 %
Brady Statistic RA Percent Paced: 64 %
Brady Statistic RV Percent Paced: 99 %
Date Time Interrogation Session: 20240924020019
Implantable Lead Connection Status: 753985
Implantable Lead Connection Status: 753985
Implantable Lead Implant Date: 20200212
Implantable Lead Implant Date: 20200212
Implantable Lead Location: 753859
Implantable Lead Location: 753860
Implantable Pulse Generator Implant Date: 20200212
Lead Channel Impedance Value: 400 Ohm
Lead Channel Impedance Value: 430 Ohm
Lead Channel Pacing Threshold Amplitude: 0.75 V
Lead Channel Pacing Threshold Amplitude: 0.75 V
Lead Channel Pacing Threshold Pulse Width: 0.5 ms
Lead Channel Pacing Threshold Pulse Width: 0.5 ms
Lead Channel Sensing Intrinsic Amplitude: 1 mV
Lead Channel Sensing Intrinsic Amplitude: 12 mV
Lead Channel Setting Pacing Amplitude: 1 V
Lead Channel Setting Pacing Amplitude: 1.75 V
Lead Channel Setting Pacing Pulse Width: 0.5 ms
Lead Channel Setting Sensing Sensitivity: 6 mV
Pulse Gen Model: 2272
Pulse Gen Serial Number: 9107114

## 2023-03-24 NOTE — Progress Notes (Signed)
Remote pacemaker transmission.   

## 2023-04-07 NOTE — Progress Notes (Signed)
Remote pacemaker transmission.   

## 2023-04-18 ENCOUNTER — Other Ambulatory Visit: Payer: Self-pay | Admitting: Cardiovascular Disease

## 2023-04-18 NOTE — Telephone Encounter (Signed)
Refill Request.  

## 2023-04-18 NOTE — Telephone Encounter (Signed)
Prescription refill request for Eliquis received. Indication: PAF Last office visit: 03/07/23  Percell Belt NP Scr: 1.7 on 03/24/23 Age: 89 Weight: 93.4kg  Based on above findings Eliquis 2.5mg  twice daily is the appropriate dose.  Refill approved.

## 2023-04-18 NOTE — Telephone Encounter (Signed)
Pt last saw Sherie Don, NP on 03/07/23, last labs 03/24/23 Creat 1.7, age 89, weight 93.4kg, based on specified criteria pt is on appropriate dosage of Eliquis 2.5mg  BID for afib.  Will refill rx.

## 2023-04-18 NOTE — Telephone Encounter (Signed)
Please review

## 2023-05-09 ENCOUNTER — Other Ambulatory Visit: Payer: Self-pay | Admitting: Cardiovascular Disease

## 2023-05-16 ENCOUNTER — Other Ambulatory Visit: Payer: Self-pay | Admitting: Cardiology

## 2023-06-08 ENCOUNTER — Ambulatory Visit (INDEPENDENT_AMBULATORY_CARE_PROVIDER_SITE_OTHER): Payer: Medicare Other

## 2023-06-08 DIAGNOSIS — I442 Atrioventricular block, complete: Secondary | ICD-10-CM | POA: Diagnosis not present

## 2023-06-08 LAB — CUP PACEART REMOTE DEVICE CHECK
Battery Remaining Longevity: 60 mo
Battery Remaining Percentage: 54 %
Battery Voltage: 2.99 V
Brady Statistic AP VP Percent: 60 %
Brady Statistic AP VS Percent: 1 %
Brady Statistic AS VP Percent: 39 %
Brady Statistic AS VS Percent: 1 %
Brady Statistic RA Percent Paced: 60 %
Brady Statistic RV Percent Paced: 99 %
Date Time Interrogation Session: 20241211020013
Implantable Lead Connection Status: 753985
Implantable Lead Connection Status: 753985
Implantable Lead Implant Date: 20200212
Implantable Lead Implant Date: 20200212
Implantable Lead Location: 753859
Implantable Lead Location: 753860
Implantable Pulse Generator Implant Date: 20200212
Lead Channel Impedance Value: 390 Ohm
Lead Channel Impedance Value: 440 Ohm
Lead Channel Pacing Threshold Amplitude: 0.75 V
Lead Channel Pacing Threshold Amplitude: 0.875 V
Lead Channel Pacing Threshold Pulse Width: 0.5 ms
Lead Channel Pacing Threshold Pulse Width: 0.5 ms
Lead Channel Sensing Intrinsic Amplitude: 12 mV
Lead Channel Sensing Intrinsic Amplitude: 2.3 mV
Lead Channel Setting Pacing Amplitude: 1.125
Lead Channel Setting Pacing Amplitude: 1.75 V
Lead Channel Setting Pacing Pulse Width: 0.5 ms
Lead Channel Setting Sensing Sensitivity: 6 mV
Pulse Gen Model: 2272
Pulse Gen Serial Number: 9107114

## 2023-06-17 ENCOUNTER — Ambulatory Visit: Payer: Medicare Other | Attending: Cardiovascular Disease | Admitting: Cardiovascular Disease

## 2023-06-17 ENCOUNTER — Encounter: Payer: Self-pay | Admitting: Cardiovascular Disease

## 2023-06-17 VITALS — BP 142/74 | HR 64 | Ht 69.0 in | Wt 200.2 lb

## 2023-06-17 DIAGNOSIS — Z950 Presence of cardiac pacemaker: Secondary | ICD-10-CM

## 2023-06-17 DIAGNOSIS — I359 Nonrheumatic aortic valve disorder, unspecified: Secondary | ICD-10-CM | POA: Diagnosis not present

## 2023-06-17 DIAGNOSIS — I480 Paroxysmal atrial fibrillation: Secondary | ICD-10-CM

## 2023-06-17 DIAGNOSIS — I10 Essential (primary) hypertension: Secondary | ICD-10-CM | POA: Diagnosis not present

## 2023-06-17 NOTE — Progress Notes (Signed)
Cardiology Office Note   Date:  06/17/2023   ID:  Travis [name]., DOB 01-Jun-1930, MRN [id number]  PCP:  Marguarite Arbour, MD  Cardiologist:   Lorine Bears, MD   Chief Complaint  Patient presents with   Follow-up    6 month follow up. Patient c/o mild left side chest and head pain. Medications reviewed verbally.      History of Present Illness: Travis [name]. is a 89 y.o. male who presents for a followup visit regarding PVCs, paroxysmal atrial fibrillation and complete heart block status post permanent pacemaker placement.  He has known history of hypertension, mild aortic stenosis, chronic kidney disease and previous stroke in February 2018. He is a previous smoker.  He had dual-chamber pacemaker placement in February 2020 after presenting with complete heart block. He was found to have atrial fibrillation on device interrogation and since then he has been on low-dose Eliquis. He does have underlying chronic kidney disease.   His wife died in May 04, 2020.  He currently lives with his daughter.  He has been doing well with no chest tightness or worsening dyspnea.  No palpitations.  No issues with Eliquis anticoagulation.  Most recent echocardiogram in May 2022 showed normal LV systolic function, mild mitral regurgitation and mildly calcified aortic valve without significant stenosis.  Past Medical History:  Diagnosis Date   Allergic rhinitis    Anemia, unspecified    Carotid arterial disease (HCC)    mild   CKD (chronic kidney disease), stage III (HCC)    Colonic polyp    Complete heart block (HCC)    a. 07/2018 s/p SJM Assurity MRI model [id number] (ser # [id number]).   Diastolic dysfunction    a. 10/2016 Echo: EF 55-60%, no rwma, Gr1 DD. Very mild AS. Triv AI.   Dizziness and giddiness    Family history of adverse reaction to anesthesia    Heel spur    Hyperlipidemia    Hypertension    Hypothyroidism    OA (osteoarthritis)    Presence of permanent cardiac  pacemaker 08/09/2018   PVC's (premature ventricular contractions)    Stroke (HCC) 07/2016   mini    Tinea cruris     Past Surgical History:  Procedure Laterality Date   APPENDECTOMY     BACK SURGERY     CIRCUMCISION     GANGLION CYST EXCISION     MOHS SURGERY     PACEMAKER IMPLANT N/A 08/09/2018   Procedure: PACEMAKER IMPLANT;  Surgeon: Regan Lemming, MD;  Location: [facility];  Service: Cardiovascular;  Laterality: N/A;   PACEMAKER INSERTION  08/09/2018   TEMPORARY PACEMAKER N/A 08/08/2018   Procedure: TEMPORARY PACEMAKER;  Surgeon: Yvonne Kendall, MD;  Location: [facility];  Service: Cardiovascular;  Laterality: N/A;     Current Outpatient Medications  Medication Sig Dispense Refill   acetaminophen (TYLENOL) 500 MG tablet Take 500 mg by mouth daily as needed.     acyclovir (ZOVIRAX) 400 MG tablet Take by mouth.     benazepril-hydrochlorthiazide (LOTENSIN HCT) 20-12.5 MG tablet TAKE 1 TABLET BY MOUTH ONCE DAILY 90 tablet 0   CARTIA XT 120 MG 24 hr capsule TAKE 1 CAPSULE BY MOUTH ONCE DAILY 90 capsule 1   celecoxib (CELEBREX) 200 MG capsule Take 200 mg by mouth daily.     desvenlafaxine (PRISTIQ) 50 MG 24 hr tablet Take 50 mg by mouth daily.     diclofenac Sodium (VOLTAREN) 1 % GEL  doxazosin (CARDURA) 4 MG tablet Take 4 mg by mouth daily.     ELIQUIS 2.5 MG TABS tablet TAKE 1 TABLET BY MOUTH TWICE DAILY 180 tablet 1   hydrocortisone 2.5 % cream Apply 1 application topically 2 (two) times daily.     levothyroxine (SYNTHROID) 100 MCG tablet Take 100 mcg by mouth daily.     meclizine (ANTIVERT) 25 MG tablet Take 25 mg by mouth 3 (three) times daily as needed.     Multiple Vitamins-Minerals (MULTIVITAMIN ADULTS 50+ PO) Take by mouth.     omeprazole (PRILOSEC) 10 MG capsule Take 10 mg by mouth daily.     polyethylene glycol (MIRALAX / GLYCOLAX) packet Take 17 g by mouth daily.     potassium chloride SA (K-DUR,KLOR-CON) 20 MEQ tablet Take 20 mEq by mouth  2 (two) times daily.     rosuvastatin (CRESTOR) 10 MG tablet Take 10 mg by mouth at bedtime.     hydrALAZINE (APRESOLINE) 10 MG tablet Take 10 mg by mouth 2 (two) times daily. Take as needed for blood pressure greater than 170/90 (Patient not taking: Reported on 06/17/2023)     No current facility-administered medications for this visit.    Allergies:   Morphine and codeine, Phenol, and Tetracyclines & related    Social History:  The patient  reports that he has quit smoking. His smoking use included cigarettes. He has never used smokeless tobacco. He reports current alcohol use of about 2.0 standard drinks of alcohol per week. He reports that he does not use drugs.   Family History:  The patient's family history includes Dementia in his mother; Hypertension in his mother; Lung cancer in his sister; Myasthenia gravis in his father.    ROS:  Please see the history of present illness.   Otherwise, review of systems are positive for none.   All other systems are reviewed and negative.    PHYSICAL EXAM: VS:  BP (!) 142/74 (BP Location: Left Arm, Patient Position: Sitting, Cuff Size: Normal)   Pulse 64   Ht 5\' 9"  (1.753 m)   Wt 200 lb 3.2 oz (90.8 kg)   SpO2 97%   BMI 29.56 kg/m  , BMI Body mass index is 29.56 kg/m. GEN: Well nourished, well developed, in no acute distress  HEENT: normal  Neck: no JVD, carotid bruits, or masses Cardiac: RRR; no rubs, or gallops,no edema . 2/6 SEM in aortic area.  Respiratory:  clear to auscultation bilaterally, normal work of breathing GI: soft, nontender, nondistended, + BS MS: no deformity or atrophy  Skin: warm and dry, no rash Neuro:  Strength and sensation are intact Psych: euthymic mood, full affect   EKG:  EKG is ordered today. The ekg ordered today demonstrates: AV dual-paced rhythm When compared with ECG of 07-Mar-2023 09:42, Vent. rate has decreased BY   3 BPM    Recent Labs: No results found for requested labs within last 365  days.    Lipid Panel No results found for: "CHOL", "TRIG", "HDL", "CHOLHDL", "VLDL", "LDLCALC", "LDLDIRECT"    Wt Readings from Last 3 Encounters:  06/17/23 200 lb 3.2 oz (90.8 kg)  03/07/23 205 lb 12.8 oz (93.4 kg)  12/16/22 208 lb (94.3 kg)          No data to display            ASSESSMENT AND PLAN:  1. Paroxysmal atrial fibrillation: He is doing well overall with no palpitations.  He is tolerating low-dose Eliquis with no  side effects.  Most recent labs showed a creatinine of 1.8.  Continue anticoagulation with Eliquis 2.5 mg twice daily.    2. Status post dual-chamber pacemaker placement for complete heart block: The device seems to be functioning normally.  3. Essential hypertension: Blood pressure is well controlled.  4. Aortic valve disease: Calcified aortic valve without significant stenosis.  His heart murmur is still benign.  No need for an echocardiogram at this time.   Disposition:   FU with me in 6 months.  Signed,  Lorine Bears, MD  06/17/2023 9:38 AM    [facility]

## 2023-06-17 NOTE — Patient Instructions (Signed)

## 2023-07-15 NOTE — Progress Notes (Signed)
Remote pacemaker transmission.   

## 2023-08-08 ENCOUNTER — Other Ambulatory Visit: Payer: Self-pay | Admitting: Cardiovascular Disease

## 2023-09-07 ENCOUNTER — Ambulatory Visit: Payer: Medicare Other

## 2023-09-07 DIAGNOSIS — I442 Atrioventricular block, complete: Secondary | ICD-10-CM

## 2023-09-08 LAB — CUP PACEART REMOTE DEVICE CHECK
Battery Remaining Longevity: 56 mo
Battery Remaining Percentage: 51 %
Battery Voltage: 2.99 V
Brady Statistic AP VP Percent: 62 %
Brady Statistic AP VS Percent: 1 %
Brady Statistic AS VP Percent: 38 %
Brady Statistic AS VS Percent: 1 %
Brady Statistic RA Percent Paced: 61 %
Brady Statistic RV Percent Paced: 99 %
Date Time Interrogation Session: 20250312020014
Implantable Lead Connection Status: 753985
Implantable Lead Connection Status: 753985
Implantable Lead Implant Date: 20200212
Implantable Lead Implant Date: 20200212
Implantable Lead Location: 753859
Implantable Lead Location: 753860
Implantable Pulse Generator Implant Date: 20200212
Lead Channel Impedance Value: 400 Ohm
Lead Channel Impedance Value: 430 Ohm
Lead Channel Pacing Threshold Amplitude: 0.875 V
Lead Channel Pacing Threshold Amplitude: 1 V
Lead Channel Pacing Threshold Pulse Width: 0.5 ms
Lead Channel Pacing Threshold Pulse Width: 0.5 ms
Lead Channel Sensing Intrinsic Amplitude: 12 mV
Lead Channel Sensing Intrinsic Amplitude: 2.4 mV
Lead Channel Setting Pacing Amplitude: 1.25 V
Lead Channel Setting Pacing Amplitude: 1.875
Lead Channel Setting Pacing Pulse Width: 0.5 ms
Lead Channel Setting Sensing Sensitivity: 6 mV
Pulse Gen Model: 2272
Pulse Gen Serial Number: 9107114

## 2023-09-11 ENCOUNTER — Encounter: Payer: Self-pay | Admitting: Cardiology

## 2023-10-18 ENCOUNTER — Other Ambulatory Visit: Payer: Self-pay | Admitting: Cardiovascular Disease

## 2023-10-18 NOTE — Telephone Encounter (Signed)
 Prescription refill request for Eliquis  received. Indication: PAF Last office visit: 06/17/23  Iven Mark MD Scr: 1.7 on 07/05/23  Epic Age: 89 Weight: 90.8kg  Based on above findings Eliquis  2.5mg  twice daily is the appropriate dose.  Refill approved.

## 2023-10-24 NOTE — Addendum Note (Signed)
 Addended by: Lott Rouleau A on: 10/24/2023 11:26 AM   Modules accepted: Orders

## 2023-10-24 NOTE — Progress Notes (Signed)
 Remote pacemaker transmission.

## 2023-11-08 ENCOUNTER — Other Ambulatory Visit: Payer: Self-pay

## 2023-11-08 MED ORDER — BENAZEPRIL-HYDROCHLOROTHIAZIDE 20-12.5 MG PO TABS: 1.0000 | ORAL_TABLET | Freq: Every day | ORAL | 0 refills | Status: DC

## 2023-11-14 ENCOUNTER — Ambulatory Visit: Admission: EM | Admit: 2023-11-14 | Discharge: 2023-11-14 | Disposition: A | Discharge status: Home / Self Care

## 2023-11-14 ENCOUNTER — Other Ambulatory Visit: Payer: Self-pay | Admitting: Cardiology

## 2023-11-14 DIAGNOSIS — R519 Headache, unspecified: Secondary | ICD-10-CM

## 2023-11-14 DIAGNOSIS — H9202 Otalgia, left ear: Secondary | ICD-10-CM | POA: Diagnosis not present

## 2023-11-14 NOTE — Discharge Instructions (Addendum)
-   There is no earwax in the left ear.  There is no damage to the eardrum.  You have a little bit of fluid behind the eardrum which could be allergy related.  Your headache could be sinus type headache related to allergies.  Continue taking Zyrtec.  Make sure you are using Flonase  every day. - If you think putting a ear plug or cotton ball in the ear for a few days may help you may do that.  You can also use warm compresses and take Tylenol .  If you are not feeling better in a few more days please reach out to your PCP for appointment. - If you ever have any associated dizziness, worsening of headaches, vision changes, numbness/tingling, facial drooping, difficulty speaking or walking, neck pain, fever, chest pain, racing heart, shortness of breath, nausea/vomiting, weakness please call 911 or go immediately to the ER for further evaluation.

## 2023-11-14 NOTE — ED Triage Notes (Signed)
 Left ear pain since Friday  headache

## 2023-11-14 NOTE — ED Provider Notes (Signed)
 [facility]    CSN: [id number] Arrival date & time: 11/14/23  4098      History   Chief Complaint Chief Complaint  Patient presents with   Otalgia    HPI Travis [name]. is a 89 y.o. male presenting for intermittent left sided ear pain and mild intermittent frontal headaches x 3 days. He noticed the ear pain after he took his hearing aids out to clean them and has not put them back in since the ear started to hurt. He has not been cleaning his ear out with a q-tip and denies ear injury but fears he may have injured the eardrum somehow. Pain goes away when he "plugs the ear" or lays on the left side. Tylenol  has also been helping and he is not currently having any ear pain, only mild headache which he rates at 2/10.  Reports a little congestion related to allergies and has been taking Zyrtec. Denies fever, fatigue, cough, sore throat, sinus pain, ear drainage, hearing changes, chest pain, shortness of breath, palpitations, dizziness, nausea or vomiting. No facial numbness, facial drooping, neck pain, confusion, extremity numbness/weakness/tingling.  Medical history significant for allergies, CKD stage III, heart block, heart failure, hypertension, hyperlipidemia, hypothyroidism, previous stroke, chronic anticoagulation, chronic back pain, and permanent pacemaker.   HPI  Past Medical History:  Diagnosis Date   Allergic rhinitis    Anemia, unspecified    Carotid arterial disease (HCC)    mild   CKD (chronic kidney disease), stage III (HCC)    Colonic polyp    Complete heart block (HCC)    a. 07/2018 s/p SJM Assurity MRI model [id number] (ser # [id number]).   Diastolic dysfunction    a. 10/2016 Echo: EF 55-60%, no rwma, Gr1 DD. Very mild AS. Triv AI.   Dizziness and giddiness    Family history of adverse reaction to anesthesia    Heel spur    Hyperlipidemia    Hypertension    Hypothyroidism    OA (osteoarthritis)    Presence of permanent cardiac pacemaker 08/09/2018    PVC's (premature ventricular contractions)    Stroke (HCC) 07/2016   mini    Tinea cruris     Patient Active Problem List   Diagnosis Date Noted   Chronic anticoagulation 01/08/2020   Presence of permanent cardiac pacemaker 01/08/2020   Paroxysmal atrial fibrillation (HCC) 01/03/2020   Secondary hypercoagulable state (HCC) 01/03/2020   Complete heart block (HCC) 08/08/2018   Lumbar spondylosis 03/22/2017   Chronic bilateral low back pain without sciatica 03/22/2017   Facet arthropathy, lumbar 03/22/2017   PVC (premature ventricular contraction) 11/23/2013   Hypertension     Past Surgical History:  Procedure Laterality Date   APPENDECTOMY     BACK SURGERY     CIRCUMCISION     GANGLION CYST EXCISION     MOHS SURGERY     PACEMAKER IMPLANT N/A 08/09/2018   Procedure: PACEMAKER IMPLANT;  Surgeon: Lei Pump, MD;  Location: [facility];  Service: Cardiovascular;  Laterality: N/A;   PACEMAKER INSERTION  08/09/2018   TEMPORARY PACEMAKER N/A 08/08/2018   Procedure: TEMPORARY PACEMAKER;  Surgeon: Sammy Crisp, MD;  Location: [facility];  Service: Cardiovascular;  Laterality: N/A;       Home Medications    Prior to Admission medications   Medication Sig Start Date End Date Taking? Authorizing Provider  acetaminophen  (TYLENOL ) 500 MG tablet Take 500 mg by mouth daily as needed.    [provider]  acyclovir (ZOVIRAX) 400 MG tablet Take by mouth. 03/29/23   [provider]  benazepril -hydrochlorthiazide (LOTENSIN  HCT) 20-12.5 MG tablet Take 1 tablet by mouth daily. 11/08/23  Yes Wenona Hamilton, MD  CARTIA  XT 120 MG 24 hr capsule TAKE 1 CAPSULE BY MOUTH ONCE DAILY 05/16/23  Yes Hammock, Sheri, NP  celecoxib (CELEBREX) 200 MG capsule Take 200 mg by mouth daily.   Yes [provider]  desvenlafaxine (PRISTIQ) 50 MG 24 hr tablet Take 50 mg by mouth daily. 04/18/23  Yes [provider]  diclofenac Sodium (VOLTAREN) 1 % GEL   07/14/18  Yes [provider]  doxazosin (CARDURA) 4 MG tablet Take 4 mg by mouth daily.   Yes [provider]  ELIQUIS  2.5 MG TABS tablet TAKE 1 TABLET BY MOUTH TWICE DAILY 10/18/23  Yes Wenona Hamilton, MD  hydrALAZINE  (APRESOLINE ) 10 MG tablet Take 10 mg by mouth 2 (two) times daily. Take as needed for blood pressure greater than 170/90 Patient not taking: Reported on 06/17/2023 01/18/20   [provider]  hydrocortisone 2.5 % cream Apply 1 application topically 2 (two) times daily. 12/13/19   [provider]  levothyroxine  (SYNTHROID ) 100 MCG tablet Take 100 mcg by mouth daily. 11/19/19  Yes [provider]  meclizine (ANTIVERT) 25 MG tablet Take 25 mg by mouth 3 (three) times daily as needed. 08/25/21   [provider]  Multiple Vitamins-Minerals (MULTIVITAMIN ADULTS 50+ PO) Take by mouth.    [provider]  omeprazole (PRILOSEC) 10 MG capsule Take 10 mg by mouth daily.   Yes [provider]  polyethylene glycol (MIRALAX  / GLYCOLAX ) packet Take 17 g by mouth daily.    [provider]  potassium chloride  SA (K-DUR,KLOR-CON ) 20 MEQ tablet Take 20 mEq by mouth 2 (two) times daily.    [provider]  rosuvastatin (CRESTOR) 10 MG tablet Take 10 mg by mouth at bedtime. 01/18/20  Yes [provider]    Family History Family History  Problem Relation Age of Onset   Hypertension Mother    Dementia Mother    Myasthenia gravis Father    Lung cancer Sister     Social History Social History   Tobacco Use   Smoking status: Former    Types: Cigarettes   Smokeless tobacco: Never  Vaping Use   Vaping status: Never Used  Substance Use Topics   Alcohol use: Yes    Alcohol/week: 2.0 standard drinks of alcohol    Types: 2 Standard drinks or equivalent per week    Comment: weekly   Drug use: No     Allergies   Morphine and codeine, Phenol, and Tetracyclines & related   Review of  Systems Review of Systems  Constitutional:  Negative for fatigue and fever.  HENT:  Positive for congestion and ear pain. Negative for ear discharge, hearing loss, rhinorrhea, sinus pain and sore throat.   Respiratory:  Negative for cough and shortness of breath.   Cardiovascular:  Negative for chest pain.  Gastrointestinal:  Negative for nausea and vomiting.  Neurological:  Positive for headaches. Negative for dizziness, syncope, weakness, light-headedness and numbness.     Physical Exam Triage Vital Signs ED Triage Vitals [11/14/23 0936]  Encounter Vitals Group     BP      Systolic BP Percentile      Diastolic BP Percentile      Pulse      Resp      Temp  Temp src      SpO2      Weight      Height      Head Circumference      Peak Flow      Pain Score 7     Pain Loc      Pain Education      Exclude from Growth Chart    No data found.  Updated Vital Signs BP (!) 157/82 (BP Location: Left Arm)   Pulse 64   Temp 97.6 F (36.4 C) (Oral)   Resp 14   SpO2 95%     Physical Exam Vitals and nursing note reviewed.  Constitutional:      General: He is not in acute distress.    Appearance: Normal appearance. He is well-developed. He is not ill-appearing.  HENT:     Head: Normocephalic and atraumatic.     Right Ear: Ear canal and external ear normal. A middle ear effusion is present.     Left Ear: Ear canal and external ear normal. A middle ear effusion is present.     Nose: Nose normal.     Mouth/Throat:     Mouth: Mucous membranes are moist.     Pharynx: Oropharynx is clear.  Eyes:     General: No scleral icterus.    Conjunctiva/sclera: Conjunctivae normal.  Cardiovascular:     Rate and Rhythm: Normal rate and regular rhythm.  Pulmonary:     Effort: Pulmonary effort is normal. No respiratory distress.     Breath sounds: Normal breath sounds.  Musculoskeletal:     Cervical back: Neck supple.  Skin:    General: Skin is warm and dry.     Capillary Refill:  Capillary refill takes less than 2 seconds.  Neurological:     General: No focal deficit present.     Mental Status: He is alert and oriented to person, place, and time. Mental status is at baseline.     Cranial Nerves: No cranial nerve deficit.     Motor: No weakness.     Coordination: Coordination normal.     Gait: Gait normal.  Psychiatric:        Mood and Affect: Mood normal.        Behavior: Behavior normal.      UC Treatments / Results  Labs (all labs ordered are listed, but only abnormal results are displayed) Labs Reviewed - No data to display  EKG   Radiology No results found.  Procedures Procedures (including critical care time)  Medications Ordered in UC Medications - No data to display  Initial Impression / Assessment and Plan / UC Course  I have reviewed the triage vital signs and the nursing notes.  Pertinent labs & imaging results that were available during my care of the patient were reviewed by me and considered in my medical decision making (see chart for details).   89 year old male presents for intermittent left ear pain and mild headache intermittently over the past 3 days.  He denies any drainage from the ear or ear injury.  Symptoms resolve when he plugs the ear.  Normally wears hearing aids but stopped wearing it recently since the pain began after taking out the hearing aid and he has been afraid to put them back in.  Has been taking Tylenol  for pain and not currently having any pain.  She denies any red flag signs or symptoms.  See HPI.  He is overall doing well.  No acute distress.  On exam he has mild effusion of bilateral TMs.  No evidence of TM injury/perforation or rupture on the left.  No cerumen present.  No erythema or bulging of eardrum on the left.  Normal neuroexam.  Chest clear and heart regular rate and rhythm.  Explained to patient that his symptoms could be related to the TM effusion.  Suggested he continue taking the Zyrtec and begin  using Flonase .  Continue Tylenol .  May use a cottonball in the ear if that helps.  Advised if not improving in a couple days to follow-up with PCP.  Thoroughly reviewed ED precautions.   Final Clinical Impressions(s) / UC Diagnoses   Final diagnoses:  Otalgia of left ear  Acute nonintractable headache, unspecified headache type     Discharge Instructions      - There is no earwax in the left ear.  There is no damage to the eardrum.  You have a little bit of fluid behind the eardrum which could be allergy related.  Your headache could be sinus type headache related to allergies.  Continue taking Zyrtec.  Make sure you are using Flonase  every day. - If you think putting a ear plug or cotton ball in the ear for a few days may help you may do that.  You can also use warm compresses and take Tylenol .  If you are not feeling better in a few more days please reach out to your PCP for appointment. - If you ever have any associated dizziness, worsening of headaches, vision changes, numbness/tingling, facial drooping, difficulty speaking or walking, neck pain, fever, chest pain, racing heart, shortness of breath, nausea/vomiting, weakness please call 911 or go immediately to the ER for further evaluation.   ED Prescriptions   None    PDMP not reviewed this encounter.   Floydene Hy, PA-C 11/14/23 1001

## 2023-12-07 ENCOUNTER — Ambulatory Visit (INDEPENDENT_AMBULATORY_CARE_PROVIDER_SITE_OTHER): Payer: Medicare Other

## 2023-12-07 DIAGNOSIS — I442 Atrioventricular block, complete: Secondary | ICD-10-CM

## 2023-12-07 LAB — CUP PACEART REMOTE DEVICE CHECK
Battery Remaining Longevity: 55 mo
Battery Remaining Percentage: 49 %
Battery Voltage: 2.99 V
Brady Statistic AP VP Percent: 60 %
Brady Statistic AP VS Percent: 1 %
Brady Statistic AS VP Percent: 40 %
Brady Statistic AS VS Percent: 1 %
Brady Statistic RA Percent Paced: 59 %
Brady Statistic RV Percent Paced: 99 %
Date Time Interrogation Session: 20250611020021
Implantable Lead Connection Status: 753985
Implantable Lead Connection Status: 753985
Implantable Lead Implant Date: 20200212
Implantable Lead Implant Date: 20200212
Implantable Lead Location: 753859
Implantable Lead Location: 753860
Implantable Pulse Generator Implant Date: 20200212
Lead Channel Impedance Value: 390 Ohm
Lead Channel Impedance Value: 410 Ohm
Lead Channel Pacing Threshold Amplitude: 0.625 V
Lead Channel Pacing Threshold Amplitude: 0.875 V
Lead Channel Pacing Threshold Pulse Width: 0.5 ms
Lead Channel Pacing Threshold Pulse Width: 0.5 ms
Lead Channel Sensing Intrinsic Amplitude: 12 mV
Lead Channel Sensing Intrinsic Amplitude: 2 mV
Lead Channel Setting Pacing Amplitude: 1.125
Lead Channel Setting Pacing Amplitude: 1.625
Lead Channel Setting Pacing Pulse Width: 0.5 ms
Lead Channel Setting Sensing Sensitivity: 6 mV
Pulse Gen Model: 2272
Pulse Gen Serial Number: 9107114

## 2023-12-10 ENCOUNTER — Ambulatory Visit: Payer: Self-pay | Admitting: Cardiology

## 2024-02-01 NOTE — Progress Notes (Signed)
 [facility] Summary Report / Loop Recorder

## 2024-02-06 ENCOUNTER — Other Ambulatory Visit: Payer: Self-pay | Admitting: Cardiovascular Disease

## 2024-02-14 ENCOUNTER — Encounter: Payer: Self-pay | Admitting: Cardiovascular Disease

## 2024-02-14 ENCOUNTER — Ambulatory Visit: Attending: Cardiovascular Disease | Admitting: Cardiovascular Disease

## 2024-02-14 VITALS — BP 148/60 | HR 64 | Ht 69.0 in | Wt 199.1 lb

## 2024-02-14 DIAGNOSIS — I359 Nonrheumatic aortic valve disorder, unspecified: Secondary | ICD-10-CM

## 2024-02-14 DIAGNOSIS — I480 Paroxysmal atrial fibrillation: Secondary | ICD-10-CM

## 2024-02-14 DIAGNOSIS — Z950 Presence of cardiac pacemaker: Secondary | ICD-10-CM

## 2024-02-14 DIAGNOSIS — I10 Essential (primary) hypertension: Secondary | ICD-10-CM

## 2024-02-14 NOTE — Progress Notes (Signed)
 Cardiology Office Note   Date:  02/14/2024   ID:  Travis [name]., DOB 1929-11-05, MRN [id number]  PCP:  Travis Reyes JONETTA, MD  Cardiologist:   Deatrice Cage, MD   Chief Complaint  Patient presents with   Follow-up    6 month f/u no complaints today. Meds reviewed verbally with pt.      History of Present Illness: Travis [name]. is a 89 y.o. male who presents for a followup visit regarding PVCs, paroxysmal atrial fibrillation and complete heart block status post permanent pacemaker placement.  He has known history of hypertension, mild aortic stenosis, chronic kidney disease and previous stroke in February 2018. He is a previous smoker.  He had dual-chamber pacemaker placement in February 2020 after presenting with complete heart block. He was found to have atrial fibrillation on device interrogation and since then he has been on low-dose Eliquis . He does have underlying chronic kidney disease.   His wife died in 05-13-2020.  He currently lives with his daughter.  Most recent echocardiogram in May 2022 showed normal LV systolic function, mild mitral regurgitation and mildly calcified aortic valve without significant stenosis.  He has been doing well with no chest pain, shortness of breath or palpitations.  No issues with anticoagulation.  Past Medical History:  Diagnosis Date   Allergic rhinitis    Anemia, unspecified    Carotid arterial disease (HCC)    mild   CKD (chronic kidney disease), stage III (HCC)    Colonic polyp    Complete heart block (HCC)    a. 07/2018 s/p SJM Assurity MRI model [id number] (ser # [id number]).   Diastolic dysfunction    a. 10/2016 Echo: EF 55-60%, no rwma, Gr1 DD. Very mild AS. Triv AI.   Dizziness and giddiness    Family history of adverse reaction to anesthesia    Heel spur    Hyperlipidemia    Hypertension    Hypothyroidism    OA (osteoarthritis)    Presence of permanent cardiac pacemaker 08/09/2018   PVC's (premature ventricular  contractions)    Stroke (HCC) 07/2016   mini    Tinea cruris     Past Surgical History:  Procedure Laterality Date   APPENDECTOMY     BACK SURGERY     CIRCUMCISION     GANGLION CYST EXCISION     MOHS SURGERY     PACEMAKER IMPLANT N/A 08/09/2018   Procedure: PACEMAKER IMPLANT;  Surgeon: Inocencio Soyla Lunger, MD;  [facility]: [facility];  Service: Cardiovascular;  Laterality: N/A;   PACEMAKER INSERTION  08/09/2018   TEMPORARY PACEMAKER N/A 08/08/2018   Procedure: TEMPORARY PACEMAKER;  Surgeon: Mady Bruckner, MD;  Location: [facility] INVASIVE CV LAB;  Service: Cardiovascular;  Laterality: N/A;     Current Outpatient Medications  Medication Sig Dispense Refill   acetaminophen  (TYLENOL ) 500 MG tablet Take 500 mg by mouth daily as needed.     acyclovir (ZOVIRAX) 400 MG tablet Take by mouth.     benazepril -hydrochlorthiazide (LOTENSIN  HCT) 20-12.5 MG tablet TAKE 1 TABLET BY MOUTH ONCE DAILY 90 tablet 0   celecoxib (CELEBREX) 200 MG capsule Take 200 mg by mouth daily.     desvenlafaxine (PRISTIQ) 50 MG 24 hr tablet Take 50 mg by mouth daily.     diclofenac Sodium (VOLTAREN) 1 % GEL      diltiazem  (CARTIA  XT) 120 MG 24 hr capsule TAKE 1 CAPSULE BY MOUTH ONCE DAILY 90 capsule 3   doxazosin (  CARDURA) 4 MG tablet Take 4 mg by mouth daily.     ELIQUIS  2.5 MG TABS tablet TAKE 1 TABLET BY MOUTH TWICE DAILY 180 tablet 1   hydrALAZINE  (APRESOLINE ) 10 MG tablet Take 10 mg by mouth 2 (two) times daily. Take as needed for blood pressure greater than 170/90     hydrocortisone 2.5 % cream Apply 1 application topically 2 (two) times daily.     levothyroxine  (SYNTHROID ) 100 MCG tablet Take 100 mcg by mouth daily.     meclizine (ANTIVERT) 25 MG tablet Take 25 mg by mouth 3 (three) times daily as needed.     Multiple Vitamins-Minerals (MULTIVITAMIN ADULTS 50+ PO) Take by mouth.     omeprazole (PRILOSEC) 10 MG capsule Take 10 mg by mouth daily.     polyethylene glycol (MIRALAX  / GLYCOLAX ) packet Take 17  g by mouth daily.     potassium chloride  SA (K-DUR,KLOR-CON ) 20 MEQ tablet Take 20 mEq by mouth 2 (two) times daily.     rosuvastatin (CRESTOR) 10 MG tablet Take 10 mg by mouth at bedtime.     No current facility-administered medications for this visit.    Allergies:   Morphine and codeine, Phenol, and Tetracyclines & related    Social History:  The patient  reports that he has quit smoking. His smoking use included cigarettes. He has never used smokeless tobacco. He reports current alcohol use of about 2.0 standard drinks of alcohol per week. He reports that he does not use drugs.   Family History:  The patient's family history includes Dementia in his mother; Hypertension in his mother; Lung cancer in his sister; Myasthenia gravis in his father.    ROS:  Please see the history of present illness.   Otherwise, review of systems are positive for none.   All other systems are reviewed and negative.    PHYSICAL EXAM: VS:  BP (!) 148/60 (BP Location: Left Arm, Patient Position: Sitting, Cuff Size: Normal)   Pulse 64   Ht 5' 9 (1.753 m)   Wt 199 lb 2 oz (90.3 kg)   SpO2 97%   BMI 29.41 kg/m  , BMI Body mass index is 29.41 kg/m. GEN: Well nourished, well developed, in no acute distress  HEENT: normal  Neck: no JVD, carotid bruits, or masses Cardiac: RRR; no rubs, or gallops,no edema . 2/6 SEM in aortic area which is early peaking.  Respiratory:  clear to auscultation bilaterally, normal work of breathing GI: soft, nontender, nondistended, + BS MS: no deformity or atrophy  Skin: warm and dry, no rash Neuro:  Strength and sensation are intact Psych: euthymic mood, full affect   EKG:  EKG is ordered today. The ekg ordered today demonstrates: AV dual-paced rhythm with prolonged AV conduction When compared with ECG of 17-Jun-2023 09:33, No significant change was found    Recent Labs: No results found for requested labs within last 365 days.    Lipid Panel No results found  for: CHOL, TRIG, HDL, CHOLHDL, VLDL, LDLCALC, LDLDIRECT    Wt Readings from Last 3 Encounters:  02/14/24 199 lb 2 oz (90.3 kg)  06/17/23 200 lb 3.2 oz (90.8 kg)  03/07/23 205 lb 12.8 oz (93.4 kg)          No data to display            ASSESSMENT AND PLAN:  1. Paroxysmal atrial fibrillation: He is doing well overall with no palpitations.  He is tolerating low-dose Eliquis  with no side  effects.  I reviewed most recent labs which showed stable renal function with a creatinine of 1.7.  2. Status post dual-chamber pacemaker placement for complete heart block: The device seems to be functioning normally.  3. Essential hypertension: Blood pressure is well controlled.  4. Aortic valve disease: Calcified aortic valve without significant stenosis.  Has heart murmur still unchanged and is not consistent with significant stenosis.   Disposition:   FU with me in 6 months.  Signed,  Deatrice Cage, MD  02/14/2024 4:10 PM    [facility]

## 2024-02-14 NOTE — Patient Instructions (Signed)

## 2024-03-07 ENCOUNTER — Ambulatory Visit: Payer: Medicare Other

## 2024-03-07 DIAGNOSIS — I442 Atrioventricular block, complete: Secondary | ICD-10-CM

## 2024-03-08 LAB — CUP PACEART REMOTE DEVICE CHECK
Battery Remaining Longevity: 52 mo
Battery Remaining Percentage: 46 %
Battery Voltage: 2.98 V
Brady Statistic AP VP Percent: 60 %
Brady Statistic AP VS Percent: 1 %
Brady Statistic AS VP Percent: 40 %
Brady Statistic AS VS Percent: 1 %
Brady Statistic RA Percent Paced: 59 %
Brady Statistic RV Percent Paced: 99 %
Date Time Interrogation Session: 20250910020013
Implantable Lead Connection Status: 753985
Implantable Lead Connection Status: 753985
Implantable Lead Implant Date: 20200212
Implantable Lead Implant Date: 20200212
Implantable Lead Location: 753859
Implantable Lead Location: 753860
Implantable Pulse Generator Implant Date: 20200212
Lead Channel Impedance Value: 380 Ohm
Lead Channel Impedance Value: 410 Ohm
Lead Channel Pacing Threshold Amplitude: 0.75 V
Lead Channel Pacing Threshold Amplitude: 0.875 V
Lead Channel Pacing Threshold Pulse Width: 0.5 ms
Lead Channel Pacing Threshold Pulse Width: 0.5 ms
Lead Channel Sensing Intrinsic Amplitude: 1 mV
Lead Channel Sensing Intrinsic Amplitude: 9.6 mV
Lead Channel Setting Pacing Amplitude: 1.125
Lead Channel Setting Pacing Amplitude: 1.75 V
Lead Channel Setting Pacing Pulse Width: 0.5 ms
Lead Channel Setting Sensing Sensitivity: 6 mV
Pulse Gen Model: 2272
Pulse Gen Serial Number: 9107114

## 2024-03-09 ENCOUNTER — Ambulatory Visit: Payer: Self-pay | Admitting: Cardiology

## 2024-03-09 NOTE — Progress Notes (Signed)
 Remote PPM Transmission

## 2024-04-19 ENCOUNTER — Ambulatory Visit: Attending: Cardiology | Admitting: Cardiology

## 2024-04-19 ENCOUNTER — Encounter: Payer: Self-pay | Admitting: Cardiology

## 2024-04-19 VITALS — BP 122/58 | HR 71 | Ht 69.0 in | Wt 204.2 lb

## 2024-04-19 DIAGNOSIS — I442 Atrioventricular block, complete: Secondary | ICD-10-CM

## 2024-04-19 DIAGNOSIS — I480 Paroxysmal atrial fibrillation: Secondary | ICD-10-CM | POA: Diagnosis not present

## 2024-04-19 DIAGNOSIS — Z950 Presence of cardiac pacemaker: Secondary | ICD-10-CM

## 2024-04-19 DIAGNOSIS — D6869 Other thrombophilia: Secondary | ICD-10-CM

## 2024-04-19 NOTE — Patient Instructions (Signed)
 Medication Instructions:  Your physician recommends that you continue on your current medications as directed. Please refer to the Current Medication list given to you today.   *If you need a refill on your cardiac medications before your next appointment, please call your pharmacy*  Lab Work: No labs ordered today  If you have labs (blood work) drawn today and your tests are completely normal, you will receive your results only by: MyChart Message (if you have MyChart) OR A paper copy in the mail If you have any lab test that is abnormal or we need to change your treatment, we will call you to review the results.  Testing/Procedures: Your physician has requested that you have an echocardiogram. Echocardiography is a painless test that uses sound waves to create images of your heart. It provides your doctor with information about the size and shape of your heart and how well your heart's chambers and valves are working.   You may receive an ultrasound enhancing agent through an IV if needed to better visualize your heart during the echo. This procedure takes approximately one hour.  There are no restrictions for this procedure.  This will take place at [address] [facility] [address] [facility], [place] [address]  Please note: We ask at that you not bring children with you during ultrasound (echo/ vascular) testing. Due to room size and safety concerns, children are not allowed in the ultrasound rooms during exams. Our front office staff cannot provide observation of children in our lobby area while testing is being conducted. An adult accompanying a patient to their appointment will only be allowed in the ultrasound room at the discretion of the ultrasound technician under special circumstances. We apologize for any inconvenience.   Follow-Up: At [facility], you and your health needs are our priority.  As part of our continuing mission to provide you with exceptional heart  care, our providers are all part of one team.  This team includes your primary Cardiologist (physician) and Advanced Practice Providers or APPs (Physician Assistants and Nurse Practitioners) who all work together to provide you with the care you need, when you need it.  Your next appointment:   12 month(s)  Provider:   Suzann Riddle, NP    We recommend signing up for the patient portal called MyChart.  Sign up information is provided on this After Visit Summary.  MyChart is used to connect with patients for Virtual Visits (Telemedicine).  Patients are able to view lab/test results, encounter notes, upcoming appointments, etc.  Non-urgent messages can be sent to your provider as well.   To learn more about what you can do with MyChart, go to [email or web address].   Other Instructions Follow up with Dr. Darron or Tylene Fake, NP in February.

## 2024-04-19 NOTE — Progress Notes (Signed)
 Electrophysiology Clinic Note    Date:  04/19/2024  Patient ID:  Travis Resurreccion., DOB 06-12-1930, MRN [id number] PCP:  Auston Reyes JONETTA, MD  Cardiologist:  Deatrice Cage, MD  Cardiology APP:  Gerard Frederick, NP  Electrophysiologist:  OLE ONEIDA HOLTS, MD  Electrophysiology APP:  Gio Janoski, NP     Discussed the use of AI scribe software for clinical note transcription with the patient, who gave verbal consent to proceed.   Patient Profile    Chief Complaint: AFib follow-up  History of Present Illness: Travis Mccarley. is a 89 y.o. male with PMH notable for CHB s/p PPM, parox afib, HTN, CVA, CKD 3 ; seen today for OLE ONEIDA HOLTS, MD for routine electrophysiology followup.   I last saw him 02/2023 where activity was limited by knee pain.  He saw Dr. Cage 01/2024 where he was doing well.   On follow-up today, he has no issues with his pacemaker.  He denies dizziness, lightheadedness, or wooziness. Occasionally, he feels weak, particularly in the early morning. He has no sensation of heart racing or irregularity.   He experiences persistent swelling in his legs throughout the day- today is a good day with only minimal swelling. He has tried compression socks in the past but found them difficult to put on.   He is on Eliquis  for atrial fibrillation, taking it twice daily without missing doses. He experiences easy bruising and bleeding when he cuts himself, but no blood in his urine or stool. He recently fell on September 1st, resulting in bruising on his arm and a head injury; he did not break his glasses.  He does not monitor his blood pressure at home, preferring not to do so. He  notes readings typically in the 130-140s   Arrhythmia/Device History St. Jude dual chamber PPM, imp 07/2018; dx CHB    ROS:  Please see the history of present illness. All other systems are reviewed and otherwise negative.    Physical Exam    VS:  BP (!) 122/58   Pulse 71   Ht  5' 9 (1.753 m)   Wt 204 lb 3.2 oz (92.6 kg)   SpO2 97%   BMI 30.16 kg/m  BMI: Body mass index is 30.16 kg/m.           Wt Readings from Last 3 Encounters:  04/19/24 204 lb 3.2 oz (92.6 kg)  02/14/24 199 lb 2 oz (90.3 kg)  06/17/23 200 lb 3.2 oz (90.8 kg)     GEN- The patient is well appearing, alert and oriented x 3 today.   Lungs- Clear to ausculation bilaterally, normal work of breathing.  Heart- Regular rate and rhythm, no murmurs, rubs or gallops Extremities- 1+ peripheral edema, warm, dry Skin-  device pocket well-healed, no tethering    Brief check performed without iterative lead testing/measurements Rare Aflutter episodes, overall burden low   Studies Reviewed   Previous EP, cardiology notes.    EKG is not ordered. Personal review of EKG from 02/14/2024 shows:  AV paced        TTE, 11/04/2020  1. Left ventricular ejection fraction, by estimation, is 55 to 60%. The left ventricle has normal function. The left ventricle has no regional wall motion abnormalities. There is mild left ventricular hypertrophy. Left ventricular diastolic parameters are consistent with Grade I diastolic dysfunction (impaired relaxation).   2. Right ventricular systolic function is normal. The right ventricular size is mildly enlarged.   3.  The mitral valve is normal in structure. Mild mitral valve regurgitation.   4. The aortic valve is tricuspid. Aortic valve regurgitation is trivial. Mild to moderate aortic valve sclerosis/calcification is present, without any evidence of aortic stenosis.   5. The inferior vena cava is normal in size with greater than 50% respiratory variability, suggesting right atrial pressure of 3 mmHg.      Assessment and Plan     #) parox AFib Asymptomatic with low overall burden Will update TTE to confirm normal LVEF Recent electrolytes stable   #) Hypercoag d/t parox afib CHA2DS2-VASc Score = at least 5 [CHF History: 0, HTN History: 1, Diabetes  History: 0, Stroke History: 2, Vascular Disease History: 0, Age Score: 2, Gender Score: 0].  Therefore, the patient's annual risk of stroke is 7.2 %.    Stroke ppx - 2.5mg  eliquis  BID, appropriately reduced for age, Cr No bleeding concerns Has rare mechanical falls, we discussed fall prevention. Continue to monitor.    #) CHB s/p PPM Battery good with stable lead measurements on recent remote transmission High VP, will update TTE         Current medicines are reviewed at length with the patient today.   The patient does not have concerns regarding his medicines.  The following changes were made today:  none  Labs/ tests ordered today include:  Orders Placed This Encounter  Procedures   ECHOCARDIOGRAM COMPLETE     Disposition: Follow up with Dr. Cindie or EP APP in 12 months, continue remote monitoring  Follow up with Dr. Darron or gen cards APP in 6 months    Signed, Kenadee Gates, NP  04/19/24  3:55 PM  Electrophysiology [facility]

## 2024-05-07 ENCOUNTER — Other Ambulatory Visit: Payer: Self-pay

## 2024-05-08 MED ORDER — BENAZEPRIL-HYDROCHLOROTHIAZIDE 20-12.5 MG PO TABS: 1.0000 | ORAL_TABLET | Freq: Every day | ORAL | 2 refills | Status: AC

## 2024-06-06 ENCOUNTER — Ambulatory Visit: Payer: Medicare Other

## 2024-06-07 LAB — CUP PACEART REMOTE DEVICE CHECK
Battery Remaining Longevity: 49 mo
Battery Remaining Percentage: 43 %
Battery Voltage: 2.98 V
Brady Statistic AP VP Percent: 58 %
Brady Statistic AP VS Percent: 1 %
Brady Statistic AS VP Percent: 41 %
Brady Statistic AS VS Percent: 1 %
Brady Statistic RA Percent Paced: 58 %
Brady Statistic RV Percent Paced: 99 %
Date Time Interrogation Session: 20251210020014
Implantable Lead Connection Status: 753985
Implantable Lead Connection Status: 753985
Implantable Lead Implant Date: 20200212
Implantable Lead Implant Date: 20200212
Implantable Lead Location: 753859
Implantable Lead Location: 753860
Implantable Pulse Generator Implant Date: 20200212
Lead Channel Impedance Value: 390 Ohm
Lead Channel Impedance Value: 400 Ohm
Lead Channel Pacing Threshold Amplitude: 0.5 V
Lead Channel Pacing Threshold Amplitude: 0.75 V
Lead Channel Pacing Threshold Pulse Width: 0.5 ms
Lead Channel Pacing Threshold Pulse Width: 0.5 ms
Lead Channel Sensing Intrinsic Amplitude: 1 mV
Lead Channel Sensing Intrinsic Amplitude: 12 mV
Lead Channel Setting Pacing Amplitude: 1 V
Lead Channel Setting Pacing Amplitude: 1.5 V
Lead Channel Setting Pacing Pulse Width: 0.5 ms
Lead Channel Setting Sensing Sensitivity: 6 mV
Pulse Gen Model: 2272
Pulse Gen Serial Number: 9107114

## 2024-06-11 ENCOUNTER — Ambulatory Visit: Attending: Cardiology

## 2024-06-11 ENCOUNTER — Ambulatory Visit: Payer: Self-pay | Admitting: Cardiology

## 2024-06-11 DIAGNOSIS — I442 Atrioventricular block, complete: Secondary | ICD-10-CM

## 2024-06-11 DIAGNOSIS — Z950 Presence of cardiac pacemaker: Secondary | ICD-10-CM

## 2024-06-11 LAB — ECHOCARDIOGRAM COMPLETE
AR max vel: 2.15 cm2
AV Area VTI: 2.17 cm2
AV Area mean vel: 2.13 cm2
AV Mean grad: 7 mmHg
AV Peak grad: 13 mmHg
Ao pk vel: 1.8 m/s
Area-P 1/2: 3.53 cm2
S' Lateral: 3.2 cm

## 2024-06-13 NOTE — Progress Notes (Signed)
 Remote PPM Transmission

## 2024-06-15 ENCOUNTER — Ambulatory Visit: Payer: Self-pay | Admitting: Cardiology

## 2024-06-20 ENCOUNTER — Ambulatory Visit: Attending: Cardiovascular Disease | Admitting: Cardiovascular Disease

## 2024-06-20 ENCOUNTER — Encounter: Payer: Self-pay | Admitting: Cardiovascular Disease

## 2024-06-20 VITALS — BP 140/70 | HR 67 | Ht 69.0 in | Wt 204.5 lb

## 2024-06-20 DIAGNOSIS — I10 Essential (primary) hypertension: Secondary | ICD-10-CM | POA: Diagnosis not present

## 2024-06-20 DIAGNOSIS — I359 Nonrheumatic aortic valve disorder, unspecified: Secondary | ICD-10-CM

## 2024-06-20 DIAGNOSIS — I480 Paroxysmal atrial fibrillation: Secondary | ICD-10-CM | POA: Diagnosis not present

## 2024-06-20 DIAGNOSIS — I5022 Chronic systolic (congestive) heart failure: Secondary | ICD-10-CM

## 2024-06-20 MED ORDER — METOPROLOL SUCCINATE ER 50 MG PO TB24: 50.0000 mg | ORAL_TABLET | Freq: Every day | ORAL | 1 refills | Status: AC

## 2024-06-20 NOTE — Progress Notes (Signed)
 "    Cardiology Office Note   Date:  06/20/2024   ID:  Travis Winfree., DOB Feb 23, 1930, MRN [id number]  PCP:  Travis Reyes JONETTA, MD  Cardiologist:   Deatrice Cage, MD   Chief Complaint  Patient presents with   6 month follow up     Patient c/o hands being cold all the time.       History of Present Illness: Travis [name]. is a 89 y.o. male who presents for a followup visit regarding PVCs, paroxysmal atrial fibrillation and complete heart block status post permanent pacemaker placement.  He has known history of hypertension, aortic stenosis, chronic kidney disease and previous stroke in February 2018. He is a previous smoker.  He had dual-chamber pacemaker placement in February 2020 after presenting with complete heart block. He was found to have atrial fibrillation on device interrogation and since then he has been on low-dose Eliquis . He does have underlying chronic kidney disease.   His wife died in Apr 25, 2020.  He currently lives with his daughter.  Echocardiogram in May 2022 showed normal LV systolic function, mild mitral regurgitation and mildly calcified aortic valve without significant stenosis.  He had a follow-up echocardiogram done this month which showed a drop in EF to 40 to 45%, mild to moderate mitral regurgitation and calcified aortic valve without significant stenosis.  In spite of a drop in EF, he has not noticed any worsening shortness of breath or orthopnea.  He does have some mild lower extremity edema.  No chest pain.  Past Medical History:  Diagnosis Date   Allergic rhinitis    Anemia, unspecified    Carotid arterial disease    mild   CKD (chronic kidney disease), stage III (HCC)    Colonic polyp    Complete heart block (HCC)    a. 07/2018 s/p SJM Assurity MRI model [id number] (ser # [id number]).   Diastolic dysfunction    a. 10/2016 Echo: EF 55-60%, no rwma, Gr1 DD. Very mild AS. Triv AI.   Dizziness and giddiness    Family history of adverse reaction  to anesthesia    Heel spur    Hyperlipidemia    Hypertension    Hypothyroidism    OA (osteoarthritis)    Presence of permanent cardiac pacemaker 08/09/2018   PVC's (premature ventricular contractions)    Stroke (HCC) 07/2016   mini    Tinea cruris     Past Surgical History:  Procedure Laterality Date   APPENDECTOMY     BACK SURGERY     CARPAL TUNNEL RELEASE Right    CIRCUMCISION     GANGLION CYST EXCISION     MOHS SURGERY     PACEMAKER IMPLANT N/A 08/09/2018   Procedure: PACEMAKER IMPLANT;  Surgeon: Inocencio Soyla Lunger, MD;  Location: [facility];  Service: Cardiovascular;  Laterality: N/A;   PACEMAKER INSERTION  08/09/2018   TEMPORARY PACEMAKER N/A 08/08/2018   Procedure: TEMPORARY PACEMAKER;  Surgeon: Mady Bruckner, MD;  Location: [facility] INVASIVE CV LAB;  Service: Cardiovascular;  Laterality: N/A;     Current Outpatient Medications  Medication Sig Dispense Refill   acetaminophen  (TYLENOL ) 500 MG tablet Take 500 mg by mouth daily as needed.     acyclovir (ZOVIRAX) 400 MG tablet Take by mouth.     benazepril -hydrochlorthiazide (LOTENSIN  HCT) 20-12.5 MG tablet Take 1 tablet by mouth daily. 90 tablet 2   celecoxib (CELEBREX) 200 MG capsule Take 200 mg by mouth daily.  desvenlafaxine (PRISTIQ) 50 MG 24 hr tablet Take 50 mg by mouth daily.     diltiazem  (CARTIA  XT) 120 MG 24 hr capsule TAKE 1 CAPSULE BY MOUTH ONCE DAILY 90 capsule 3   doxazosin (CARDURA) 4 MG tablet Take 4 mg by mouth daily.     ELIQUIS  2.5 MG TABS tablet TAKE 1 TABLET BY MOUTH TWICE DAILY 180 tablet 1   hydrALAZINE  (APRESOLINE ) 10 MG tablet Take 10 mg by mouth 2 (two) times daily. Take as needed for blood pressure greater than 170/90     hydrocortisone 2.5 % cream Apply 1 application topically 2 (two) times daily.     levothyroxine  (SYNTHROID ) 100 MCG tablet Take 100 mcg by mouth daily.     meclizine (ANTIVERT) 25 MG tablet Take 25 mg by mouth 3 (three) times daily as needed.     Multiple  Vitamins-Minerals (MULTIVITAMIN ADULTS 50+ PO) Take by mouth.     omeprazole (PRILOSEC) 10 MG capsule Take 10 mg by mouth daily.     polyethylene glycol (MIRALAX  / GLYCOLAX ) packet Take 17 g by mouth daily.     potassium chloride  SA (K-DUR,KLOR-CON ) 20 MEQ tablet Take 20 mEq by mouth 2 (two) times daily.     rosuvastatin (CRESTOR) 10 MG tablet Take 10 mg by mouth at bedtime.     diclofenac Sodium (VOLTAREN) 1 % GEL  (Patient not taking: Reported on 06/20/2024)     No current facility-administered medications for this visit.    Allergies:   Morphine and codeine, Phenol, and Tetracyclines & related    Social History:  The patient  reports that he has quit smoking. His smoking use included cigarettes. He has never used smokeless tobacco. He reports current alcohol use of about 2.0 standard drinks of alcohol per week. He reports that he does not use drugs.   Family History:  The patient's family history includes Dementia in his mother; Hypertension in his mother; Lung cancer in his sister; Myasthenia gravis in his father.    ROS:  Please see the history of present illness.   Otherwise, review of systems are positive for none.   All other systems are reviewed and negative.    PHYSICAL EXAM: VS:  BP (!) 140/70 (BP Location: Left Arm, Patient Position: Sitting, Cuff Size: Normal)   Pulse 67   Ht 5' 9 (1.753 m)   Wt 204 lb 8 oz (92.8 kg)   SpO2 99%   BMI 30.20 kg/m  , BMI Body mass index is 30.2 kg/m. GEN: Well nourished, well developed, in no acute distress  HEENT: normal  Neck: no JVD, carotid bruits, or masses Cardiac: RRR; no rubs, or gallops,no edema . 2/6 SEM in aortic area which is early peaking.  Respiratory:  clear to auscultation bilaterally, normal work of breathing GI: soft, nontender, nondistended, + BS MS: no deformity or atrophy  Skin: warm and dry, no rash Neuro:  Strength and sensation are intact Psych: euthymic mood, full affect   EKG:  EKG is ordered today. The  ekg ordered today demonstrates: Atrial-sensed ventricular-paced rhythm with prolonged AV conduction When compared with ECG of 14-Feb-2024 16:05, Vent. rate has increased BY   3 BPM    Recent Labs: No results found for requested labs within last 365 days.    Lipid Panel No results found for: CHOL, TRIG, HDL, CHOLHDL, VLDL, LDLCALC, LDLDIRECT    Wt Readings from Last 3 Encounters:  06/20/24 204 lb 8 oz (92.8 kg)  04/19/24 204 lb 3.2 oz (  92.6 kg)  02/14/24 199 lb 2 oz (90.3 kg)          No data to display            ASSESSMENT AND PLAN:  1. Paroxysmal atrial fibrillation: He is doing well overall with no palpitations.  He is tolerating low-dose Eliquis  with no side effects.  I reviewed most recent labs which showed stable renal function with a creatinine of 1.8.  2. Status post dual-chamber pacemaker placement for complete heart block: The device seems to be functioning normally.  3. Essential hypertension: Blood pressure is well controlled.  4. Aortic valve disease: Calcified aortic valve without significant stenosis.    5.  Chronic systolic heart failure with mildly reduced ejection fraction at 40 to 45%.  Reduced EF is new to him and I suspect this is likely due to right ventricular pacing.  Fortunately, he has minimal change in symptoms.  Recommend changing diltiazem  to Toprol .  I also discontinued doxazosin which was being used for essential hypertension.  Continue lisinopril-hydrochlorothiazide .  If blood pressure increases, the dose can be increased or switch to Entresto. Considering his age, no plans for ischemic cardiac evaluation or consideration of a left ventricular pacemaker lead.   Disposition:   FU with me in 3 months.  Signed,  Deatrice Cage, MD  06/20/2024 10:07 AM    [facility] "

## 2024-06-20 NOTE — Patient Instructions (Signed)
 Medication Instructions:  STOP the Doxazosin STOP the Diltiazem   START Metoprolol  (Toprol ) 50 mg once daily  *If you need a refill on your cardiac medications before your next appointment, please call your pharmacy*  Lab Work: None ordered If you have labs (blood work) drawn today and your tests are completely normal, you will receive your results only by: MyChart Message (if you have MyChart) OR A paper copy in the mail If you have any lab test that is abnormal or we need to change your treatment, we will call you to review the results.  Testing/Procedures: None ordered  Follow-Up: At [facility], you and your health needs are our priority.  As part of our continuing mission to provide you with exceptional heart care, our providers are all part of one team.  This team includes your primary Cardiologist (physician) and Advanced Practice Providers or APPs (Physician Assistants and Nurse Practitioners) who all work together to provide you with the care you need, when you need it.  Your next appointment:   3 month(s)  Provider:   You may see Deatrice Cage, MD or one of the following Advanced Practice Providers on your designated Care Team:   Lonni Meager, NP Lesley Maffucci, PA-C Bernardino Bring, PA-C Cadence [place], PA-C Tylene Lunch, NP Barnie Hila, NP    We recommend signing up for the patient portal called MyChart.  Sign up information is provided on this After Visit Summary.  MyChart is used to connect with patients for Virtual Visits (Telemedicine).  Patients are able to view lab/test results, encounter notes, upcoming appointments, etc.  Non-urgent messages can be sent to your provider as well.   To learn more about what you can do with MyChart, go to [email or web address].

## 2024-07-09 ENCOUNTER — Ambulatory Visit: Admission: RE | Admit: 2024-07-09 | Admitting: Ophthalmology

## 2024-07-09 ENCOUNTER — Encounter: Admission: RE | Payer: Self-pay | Source: Home / Self Care

## 2024-07-09 SURGERY — PHACOEMULSIFICATION, CATARACT, WITH IOL INSERTION
Anesthesia: Topical | Laterality: Right

## 2024-07-16 ENCOUNTER — Other Ambulatory Visit: Payer: Self-pay | Admitting: Cardiovascular Disease

## 2024-07-23 ENCOUNTER — Ambulatory Visit: Admit: 2024-07-23 | Admitting: Ophthalmology

## 2024-08-14 ENCOUNTER — Ambulatory Visit: Admitting: Cardiovascular Disease

## 2024-09-19 ENCOUNTER — Ambulatory Visit: Admitting: Cardiovascular Disease
